# Patient Record
Sex: Male | Born: 1995
Health system: Southern US, Community
[De-identification: ages and names within clinical notes are randomized; demographics above are authoritative.]

## PROBLEM LIST (undated history)

## (undated) DIAGNOSIS — F17200 Nicotine dependence, unspecified, uncomplicated: Secondary | ICD-10-CM

## (undated) DIAGNOSIS — Z87898 Personal history of other specified conditions: Secondary | ICD-10-CM

## (undated) DIAGNOSIS — R569 Unspecified convulsions: Secondary | ICD-10-CM

---
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
## 2013-05-24 ENCOUNTER — Encounter (HOSPITAL_COMMUNITY): Payer: Self-pay | Admitting: Emergency Medicine

## 2013-05-24 ENCOUNTER — Emergency Department (HOSPITAL_COMMUNITY)
Admission: EM | Admit: 2013-05-24 | Discharge: 2013-05-24 | Disposition: A | Discharge status: Home / Self Care | Payer: Self-pay | Attending: Emergency Medicine | Admitting: Emergency Medicine

## 2013-05-24 ENCOUNTER — Encounter (HOSPITAL_COMMUNITY): Payer: Self-pay | Admitting: Cardiology

## 2013-05-24 DIAGNOSIS — T50995A Adverse effect of other drugs, medicaments and biological substances, initial encounter: Secondary | ICD-10-CM | POA: Insufficient documentation

## 2013-05-24 DIAGNOSIS — F17200 Nicotine dependence, unspecified, uncomplicated: Secondary | ICD-10-CM | POA: Insufficient documentation

## 2013-05-24 DIAGNOSIS — L255 Unspecified contact dermatitis due to plants, except food: Secondary | ICD-10-CM | POA: Insufficient documentation

## 2013-05-24 DIAGNOSIS — Y9389 Activity, other specified: Secondary | ICD-10-CM | POA: Insufficient documentation

## 2013-05-24 DIAGNOSIS — Y9289 Other specified places as the place of occurrence of the external cause: Secondary | ICD-10-CM | POA: Insufficient documentation

## 2013-05-24 DIAGNOSIS — T50905A Adverse effect of unspecified drugs, medicaments and biological substances, initial encounter: Secondary | ICD-10-CM

## 2013-05-24 DIAGNOSIS — L237 Allergic contact dermatitis due to plants, except food: Secondary | ICD-10-CM

## 2013-05-24 MED ORDER — LORAZEPAM 1 MG PO TABS
1.0000 mg | ORAL_TABLET | Freq: Once | ORAL | Status: AC
  Administered 2013-05-24: 1 mg via ORAL
  Filled 2013-05-24: qty 1

## 2013-05-24 MED ORDER — DEXAMETHASONE SODIUM PHOSPHATE 10 MG/ML IJ SOLN
10.0000 mg | Freq: Once | INTRAMUSCULAR | Status: AC
  Administered 2013-05-24: 10 mg via INTRAMUSCULAR
  Filled 2013-05-24: qty 1

## 2013-05-24 MED ORDER — HYDROXYZINE HCL 25 MG PO TABS: 25.0000 mg | ORAL_TABLET | Freq: Four times a day (QID) | ORAL | Status: DC | PRN

## 2013-05-24 NOTE — ED Notes (Signed)
Pt reports he was seen here this am for poison ivy, received a dexamethasone injection and discharged from our facility at 0910. Pt reports approx 1000 he became very angry, SOB, elevated heartbeat, cold sweats, and his face and upper torso became red and warm to touch. Pt denies taking Dexamethasone in the past but has taken Methadose tapering pack and had similar reaction but not as extreme. Pt a/o x4, no respiratory distress.

## 2013-05-24 NOTE — ED Provider Notes (Signed)
  Medical screening examination/treatment/procedure(s) were performed by non-physician practitioner and as supervising physician I was immediately available for consultation/collaboration.    The patient's EKG had sinus tachycardia, with a rate of 106, otherwise unremarkable, but abnormal EKG.  Gerhard Munch, MD 05/24/13 1331

## 2013-05-24 NOTE — ED Provider Notes (Signed)
CSN: [id number]     Arrival date & time 05/24/13  1047 History     First MD Initiated Contact with Patient 05/24/13 1121     Chief Complaint  Patient presents with  . Allergic Reaction   (Consider location/radiation/quality/duration/timing/severity/associated sxs/prior Treatment) HPI Comments: 17 year old male presents back to the emergency department with his neighbor and girlfriend after being discharged from the emergency department about 2 hours ago complaining of irritability, racing heart and anxiety after receiving a shot of Decadron for poison ivy. Patient states as soon as he got home he felt the need to "flip furniture", was very anxious, irritable and hyperactive with a racing heart. Denies facial swelling, difficulty swallowing or breathing. This is his second time receiving a steroid, last being when he was 17 years old, had a similar reaction at that time.  Patient is a 17 y.o. male presenting with allergic reaction. The history is provided by the patient and a friend.  Allergic Reaction Presenting symptoms: rash   Presenting symptoms: no difficulty swallowing     History reviewed. No pertinent past medical history. History reviewed. No pertinent past surgical history. History reviewed. No pertinent family history. History  Substance Use Topics  . Smoking status: Current Every Day Smoker  . Smokeless tobacco: Not on file  . Alcohol Use: No    Review of Systems  HENT: Negative for facial swelling and trouble swallowing.   Respiratory: Negative for shortness of breath.   Skin: Positive for rash.  Psychiatric/Behavioral: Positive for behavioral problems. The patient is nervous/anxious.   All other systems reviewed and are negative.    Allergies  Review of patient's allergies indicates no known allergies.  Home Medications   Current Outpatient Rx  Name  Route  Sig  Dispense  Refill  . diphenhydrAMINE (BENADRYL) 25 MG tablet   Oral   Take 25 mg by mouth once.         . hydrOXYzine (ATARAX/VISTARIL) 25 MG tablet   Oral   Take 1 tablet (25 mg total) by mouth every 6 (six) hours as needed for itching.   15 tablet   0    BP 147/89  Pulse 107  Temp(Src) 98.2 F (36.8 C) (Oral)  Resp 18  SpO2 100% Physical Exam  Nursing note and vitals reviewed. Constitutional: He is oriented to person, place, and time. He appears well-developed and well-nourished. No distress.  HENT:  Head: Normocephalic and atraumatic.  Mouth/Throat: Oropharynx is clear and moist.  Eyes: Conjunctivae are normal.  Neck: Normal range of motion. Neck supple.  Cardiovascular: Regular rhythm and normal heart sounds.  Tachycardia present.   Pulmonary/Chest: Effort normal and breath sounds normal.  Musculoskeletal: Normal range of motion. He exhibits no edema.  Neurological: He is alert and oriented to person, place, and time.  Skin: Skin is warm and dry. He is not diaphoretic.  Scattered maculopapular rash in a linear fashion across bilateral upper extremities and around neck. No evidence of secondary infection.  Psychiatric: His speech is normal and behavior is normal. Thought content normal. His mood appears anxious.    ED Course   Procedures (including critical care time)  Labs Reviewed - No data to display No results found. 1. Medication reaction, initial encounter     MDM  Patient with anxiety and inability after receiving a shot of Decadron. He was discharge is any further prednisone dose. He was discharged with Vistaril. No respiratory or airway compromise. He began to calmed down prior to being seen, in  no apparent distress. 1 mg Ativan given. Discussed side effects of prednisone. Return precautions discussed. Patient states understanding of plan and is agreeable.  Trevor Mace, PA-C 05/24/13 1144

## 2013-05-24 NOTE — ED Provider Notes (Signed)
  CSN: [id number]     Arrival date & time 05/24/13  0801 History     First MD Initiated Contact with Patient 05/24/13 [phone]     Chief Complaint  Patient presents with  . Rash   (Consider location/radiation/quality/duration/timing/severity/associated sxs/prior Treatment) The history is provided by the patient and medical records.   Patient presents to the ED for poison ivy exposure. 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 Patient now has rash to face, and bilateral UE.  Patient states he had poison ivy several times with similar symptoms. Denies any difficulty breathing or swallowing. Has tried taking Benadryl and applying Benadryl cream without significant relief of symptoms.  History reviewed. No pertinent past medical history. History reviewed. No pertinent past surgical history. No family history on file. History  Substance Use Topics  . Smoking status: Current Every Day Smoker  . Smokeless tobacco: Not on file  . Alcohol Use: No    Review of Systems  Skin: Positive for rash.  All other systems reviewed and are negative.    Allergies  Review of patient's allergies indicates no known allergies.  Home Medications   Current Outpatient Rx  Name  Route  Sig  Dispense  Refill  . diphenhydrAMINE (BENADRYL) 25 MG tablet   Oral   Take 25 mg by mouth once.          BP 120/72  Pulse 67  Temp(Src) 98.5 F (36.9 C) (Oral)  Resp 18  SpO2 98%  Physical Exam  Nursing note and vitals reviewed. Constitutional: He is oriented to person, place, and time. He appears well-developed and well-nourished.  HENT:  Head: Normocephalic and atraumatic.  Mouth/Throat: Uvula is midline, oropharynx is clear and moist and mucous membranes are normal. No oral lesions. No oropharyngeal exudate, posterior oropharyngeal edema, posterior oropharyngeal erythema or tonsillar abscesses.  No oral lesions identified, no oropharyngeal edema, airway  patent, handling secretions appropriately, speaking in full complete sentences without difficulty  Eyes: Conjunctivae and EOM are normal. Pupils are equal, round, and reactive to light.  Neck: Normal range of motion. Neck supple.  Cardiovascular: Normal rate, regular rhythm and normal heart sounds.   Pulmonary/Chest: Effort normal and breath sounds normal.  Musculoskeletal: Normal range of motion.  Neurological: He is alert and oriented to person, place, and time.  Skin: Skin is warm and dry. Rash noted.  Several areas of pruritic, red lesions in linear fashion across BUE and face, some lesions with excoriation, no signs of infection  Psychiatric: He has a normal mood and affect.    ED Course   Procedures (including critical care time)  Labs Reviewed - No data to display No results found.  1. Poison ivy     MDM   Rash consistent with recent poison ivy exposure. Decadron given in the ED. Rx Atarax. Instructed to use calamine lotion at home.  FU with [facility] if problems occur.  Discussed plan with pt, he agreed.  Return precautions advised.  Garlon Hatchet, PA-C 05/24/13 1132

## 2013-05-24 NOTE — ED Notes (Signed)
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 Now has redness and swelling to face, eyes, rash to extremities. Pt states "it's even in my throat". Airway intact.

## 2013-05-24 NOTE — ED Notes (Signed)
Pt reports that he was seen here earlier for poison ivy and given a shot of decadron. States he then began to felt like he was having a reaction. Reports feeling angry and that his heart was racing. Pt is tearful at triage and unable to sit still. States his body feels numb.

## 2013-05-24 NOTE — ED Provider Notes (Signed)
Medical screening examination/treatment/procedure(s) were performed by non-physician practitioner and as supervising physician I was immediately available for consultation/collaboration.    Gwyneth Sprout, MD 05/24/13 [phone]

## 2014-01-28 ENCOUNTER — Encounter (HOSPITAL_COMMUNITY): Payer: Self-pay | Admitting: Emergency Medicine

## 2014-01-28 ENCOUNTER — Emergency Department (HOSPITAL_COMMUNITY)
Admission: EM | Admit: 2014-01-28 | Discharge: 2014-01-29 | Disposition: A | Discharge status: Home / Self Care | Payer: Self-pay | Attending: Emergency Medicine | Admitting: Emergency Medicine

## 2014-01-28 DIAGNOSIS — F17200 Nicotine dependence, unspecified, uncomplicated: Secondary | ICD-10-CM | POA: Insufficient documentation

## 2014-01-28 DIAGNOSIS — Z8619 Personal history of other infectious and parasitic diseases: Secondary | ICD-10-CM | POA: Insufficient documentation

## 2014-01-28 DIAGNOSIS — Z202 Contact with and (suspected) exposure to infections with a predominantly sexual mode of transmission: Secondary | ICD-10-CM | POA: Insufficient documentation

## 2014-01-28 NOTE — ED Notes (Signed)
Pt states that his girlfriend had and STD and he wants to check and make sure he did not catch on as well.

## 2014-01-29 LAB — URINALYSIS, ROUTINE W REFLEX MICROSCOPIC
Bilirubin Urine: NEGATIVE
Glucose, UA: NEGATIVE mg/dL
HGB URINE DIPSTICK: NEGATIVE
Ketones, ur: NEGATIVE mg/dL
Leukocytes, UA: NEGATIVE
NITRITE: NEGATIVE
PH: 6 (ref 5.0–8.0)
Protein, ur: NEGATIVE mg/dL
SPECIFIC GRAVITY, URINE: 1.03 (ref 1.005–1.030)
UROBILINOGEN UA: 0.2 mg/dL (ref 0.0–1.0)

## 2014-01-29 LAB — RPR: RPR: NONREACTIVE

## 2014-01-29 LAB — HIV ANTIBODY (ROUTINE TESTING W REFLEX): HIV: NONREACTIVE

## 2014-01-29 MED ORDER — AZITHROMYCIN 250 MG PO TABS
1000.0000 mg | ORAL_TABLET | Freq: Once | ORAL | Status: AC
  Administered 2014-01-29: 1000 mg via ORAL
  Filled 2014-01-29: qty 4

## 2014-01-29 NOTE — ED Provider Notes (Signed)
Medical screening examination/treatment/procedure(s) were performed by non-physician practitioner and as supervising physician I was immediately available for consultation/collaboration.   EKG Interpretation None        Darlys Galesavid Masneri, MD 01/29/14 1009

## 2014-01-29 NOTE — ED Provider Notes (Signed)
CSN: [id number]     Arrival date & time 01/28/14  2348 History   First MD Initiated Contact with Patient 01/29/14 0017     Chief Complaint  Patient presents with  . Exposure to STD   HPI  History provided by patient and significant other. Patient is 18 year old male presenting with concerns for possible exposure to STD. Patient's partner states she was diagnosed with chlamydia infection. Patient denies any symptoms. He denies any dysuria, hematuria urinary frequency, penile discharge, testicle pain or swelling. He would like to be screened for all STDs. No other complaints.   History reviewed. No pertinent past medical history. History reviewed. No pertinent past surgical history. History reviewed. No pertinent family history. History  Substance Use Topics  . Smoking status: Current Every Day Smoker    Types: Cigarettes  . Smokeless tobacco: Not on file  . Alcohol Use: No    Review of Systems  Constitutional: Negative for fever.  Genitourinary: Negative for dysuria, frequency, hematuria, flank pain, discharge, scrotal swelling, penile pain and testicular pain.  All other systems reviewed and are negative.      Allergies  Dexamethasone and Prednisone  Home Medications   Current Outpatient Rx  Name  Route  Sig  Dispense  Refill  . hydrOXYzine (ATARAX/VISTARIL) 25 MG tablet   Oral   Take 1 tablet (25 mg total) by mouth every 6 (six) hours as needed for itching.   15 tablet   0    BP 115/83  Pulse 65  Temp(Src) 97.9 F (36.6 C) (Oral)  Resp 16  Ht 6' (1.829 m)  Wt 160 lb (72.576 kg)  BMI 21.70 kg/m2  SpO2 100% Physical Exam  Nursing note and vitals reviewed. Constitutional: He is oriented to person, place, and time. He appears well-developed and well-nourished.  HENT:  Head: Normocephalic.  Cardiovascular: Normal rate and regular rhythm.   Pulmonary/Chest: Effort normal and breath sounds normal. No respiratory distress.  Abdominal: Soft.  Genitourinary: Testes  normal and penis normal. Right testis shows no mass, no swelling and no tenderness. Left testis shows no mass, no swelling and no tenderness. Circumcised. No discharge found.  Lymphadenopathy:       Right: No inguinal adenopathy present.       Left: No inguinal adenopathy present.  Neurological: He is alert and oriented to person, place, and time.  Skin: Skin is warm. No rash noted.  Psychiatric: He has a normal mood and affect. His behavior is normal.    ED Course  Procedures COORDINATION OF CARE:  Nursing notes reviewed. Vital signs reviewed. Initial pt interview and examination performed.   Filed Vitals:   01/28/14 2356  BP: 115/83  Pulse: 65  Temp: 97.9 F (36.6 C)  TempSrc: Oral  Resp: 16  Height: 6' (1.829 m)  Weight: 160 lb (72.576 kg)  SpO2: 100%    12:47 AM-patient seen and evaluated. Patient well-appearing no acute distress. No signs or symptoms of STD at this time. We'll give treatment for chlamydia given partner's recent diagnosis. Other tests pending.   MDM   Final diagnoses:  Possible exposure to STD       Angus SellerPeter S Kyandra Mcclaine, PA-C 01/29/14 [phone]

## 2014-01-29 NOTE — ED Notes (Signed)
Pt states that he has no complaints at this time, just wants to be checked since his girlfriend recently found out she had an STD and did not know she had anything either.

## 2014-01-29 NOTE — Discharge Instructions (Signed)
You were given treatment for possible exposure to chlamydia.  You have other tests pending for other sexually transmitted disease. It is recommended you do not engage in intercourse until you have all of your results next week. Followup with a primary care provider or go for [facility] STD clinic for further evaluation and treatment.   Sexually Transmitted Disease A sexually transmitted disease (STD) is a disease or infection that may be passed (transmitted) from person to person, usually during sexual activity. This may happen by way of saliva, semen, blood, vaginal mucus, or urine. Common STDs include:   Gonorrhea.   Chlamydia.   Syphilis.   HIV and AIDS.   Genital herpes.   Hepatitis B and C.   Trichomonas.   Human papillomavirus (HPV).   Pubic lice.   Scabies.  Mites.  Bacterial vaginosis. WHAT ARE CAUSES OF STDs? An STD may be caused by bacteria, a virus, or parasites. STDs are often transmitted during sexual activity if one person is infected. However, they may also be transmitted through nonsexual means. STDs may be transmitted after:   Sexual intercourse with an infected person.   Sharing sex toys with an infected person.   Sharing needles with an infected person or using unclean piercing or tattoo needles.  Having intimate contact with the genitals, mouth, or rectal areas of an infected person.   Exposure to infected fluids during birth. WHAT ARE THE SIGNS AND SYMPTOMS OF STDs? Different STDs have different symptoms. Some people may not have any symptoms. If symptoms are present, they may include:   Painful or bloody urination.   Pain in the pelvis, abdomen, vagina, anus, throat, or eyes.   Skin rash, itching, irritation, growths, sores (lesions), ulcerations, or warts in the genital or anal area.  Abnormal vaginal discharge with or without bad odor.   Penile discharge in men.   Fever.   Pain or bleeding during sexual  intercourse.   Swollen glands in the groin area.   Yellow skin and eyes (jaundice). This is seen with hepatitis.   Swollen testicles.  Infertility.  Sores and blisters in the mouth. HOW ARE STDs DIAGNOSED? To make a diagnosis, your health care provider may:   Take a medical history.   Perform a physical exam.   Take a sample of any discharge for examination.  Swab the throat, cervix, opening to the penis, rectum, or vagina for testing.  Test a sample of your first morning urine.   Perform blood tests.   Perform a Pap smear, if this applies.   Perform a colposcopy.   Perform a laparoscopy.  HOW ARE STDs TREATED? Treatment depends on the STD. Some STDs may be treated but not cured.   Chlamydia, gonorrhea, trichomonas, and syphilis can be cured with antibiotics.   Genital herpes, hepatitis, and HIV can be treated, but not cured, with prescribed medicines. The medicines lessen symptoms.   Genital warts from HPV can be treated with medicine or by freezing, burning (electrocautery), or surgery. Warts may come back.   HPV cannot be cured with medicine or surgery. However, abnormal areas may be removed from the cervix, vagina, or vulva.   If your diagnosis is confirmed, your recent sexual partners need treatment. This is true even if they are symptom-free or have a negative culture or evaluation. They should not have sex until their health care providers say it is OK. HOW CAN I REDUCE MY RISK OF GETTING AN STD?  Use latex condoms, dental dams, and  water-soluble lubricants during sexual activity. Do not use petroleum jelly or oils.  Get vaccinated for HPV and hepatitis. If you have not received these vaccines in the past, talk to your health care provider about whether one or both might be right for you.   Avoid risky sex practices that can break the skin.  WHAT SHOULD I DO IF I THINK I HAVE AN STD?  See your health care provider.   Inform all sexual  partners. They should be tested and treated for any STDs.  Do not have sex until your health care provider says it is OK. WHEN SHOULD I GET HELP? Seek immediate medical care if:  You develop severe abdominal pain.  You are a man and notice swelling or pain in the testicles.  You are a woman and notice swelling or pain in your vagina. Document Released: 01/04/2003 Document Revised: 08/04/2013 Document Reviewed: 05/04/2013 [facility] Patient Information 2014 [place], [place].

## 2014-01-31 LAB — GC/CHLAMYDIA PROBE AMP
CT PROBE, AMP APTIMA: POSITIVE — AB
GC Probe RNA: NEGATIVE

## 2014-02-02 ENCOUNTER — Telehealth (HOSPITAL_BASED_OUTPATIENT_CLINIC_OR_DEPARTMENT_OTHER): Payer: Self-pay | Admitting: Emergency Medicine

## 2014-02-02 NOTE — Telephone Encounter (Signed)
+  Chlamydia. Patient treated with Rocephin and Zithromax. [employer] faxed. 

## 2014-05-22 ENCOUNTER — Emergency Department (HOSPITAL_COMMUNITY)
Admission: EM | Admit: 2014-05-22 | Discharge: 2014-05-22 | Discharge status: Against Medical Advice | Payer: Self-pay | Attending: Emergency Medicine | Admitting: Emergency Medicine

## 2014-05-22 ENCOUNTER — Encounter (HOSPITAL_COMMUNITY): Payer: Self-pay | Admitting: Emergency Medicine

## 2014-05-22 DIAGNOSIS — H9209 Otalgia, unspecified ear: Secondary | ICD-10-CM | POA: Insufficient documentation

## 2014-05-22 NOTE — ED Notes (Signed)
Pt states he is unable to wait to be seen because he has to work tomorrow.  States he will come back another day.

## 2014-05-22 NOTE — ED Notes (Signed)
Presents with left earlobe swelling and drainage began 4 days ago.

## 2014-12-05 ENCOUNTER — Emergency Department (HOSPITAL_BASED_OUTPATIENT_CLINIC_OR_DEPARTMENT_OTHER)
Admission: EM | Admit: 2014-12-05 | Discharge: 2014-12-05 | Disposition: A | Discharge status: Home / Self Care | Payer: Self-pay | Attending: Emergency Medicine | Admitting: Emergency Medicine

## 2014-12-05 ENCOUNTER — Emergency Department (HOSPITAL_BASED_OUTPATIENT_CLINIC_OR_DEPARTMENT_OTHER): Payer: Self-pay

## 2014-12-05 ENCOUNTER — Encounter (HOSPITAL_BASED_OUTPATIENT_CLINIC_OR_DEPARTMENT_OTHER): Payer: Self-pay

## 2014-12-05 DIAGNOSIS — K2970 Gastritis, unspecified, without bleeding: Secondary | ICD-10-CM

## 2014-12-05 DIAGNOSIS — Z720 Tobacco use: Secondary | ICD-10-CM | POA: Insufficient documentation

## 2014-12-05 DIAGNOSIS — K2900 Acute gastritis without bleeding: Secondary | ICD-10-CM | POA: Insufficient documentation

## 2014-12-05 DIAGNOSIS — Z79899 Other long term (current) drug therapy: Secondary | ICD-10-CM | POA: Insufficient documentation

## 2014-12-05 DIAGNOSIS — R109 Unspecified abdominal pain: Secondary | ICD-10-CM

## 2014-12-05 LAB — CBC WITH DIFFERENTIAL/PLATELET
BASOS PCT: 0 % (ref 0–1)
Basophils Absolute: 0 10*3/uL (ref 0.0–0.1)
Eosinophils Absolute: 0 10*3/uL (ref 0.0–0.7)
Eosinophils Relative: 0 % (ref 0–5)
HEMATOCRIT: 45.4 % (ref 39.0–52.0)
Hemoglobin: 16.2 g/dL (ref 13.0–17.0)
Lymphocytes Relative: 6 % — ABNORMAL LOW (ref 12–46)
Lymphs Abs: 1.5 10*3/uL (ref 0.7–4.0)
MCH: 30.7 pg (ref 26.0–34.0)
MCHC: 35.7 g/dL (ref 30.0–36.0)
MCV: 86.1 fL (ref 78.0–100.0)
MONO ABS: 1.2 10*3/uL — AB (ref 0.1–1.0)
MONOS PCT: 5 % (ref 3–12)
NEUTROS PCT: 89 % — AB (ref 43–77)
Neutro Abs: 20.9 10*3/uL — ABNORMAL HIGH (ref 1.7–7.7)
PLATELETS: 247 10*3/uL (ref 150–400)
RBC: 5.27 MIL/uL (ref 4.22–5.81)
RDW: 12.8 % (ref 11.5–15.5)
WBC: 23.6 10*3/uL — ABNORMAL HIGH (ref 4.0–10.5)

## 2014-12-05 LAB — COMPREHENSIVE METABOLIC PANEL
ALBUMIN: 5.1 g/dL (ref 3.5–5.2)
ALT: 9 U/L (ref 0–53)
AST: 28 U/L (ref 0–37)
Alkaline Phosphatase: 71 U/L (ref 39–117)
Anion gap: 9 (ref 5–15)
BILIRUBIN TOTAL: 0.9 mg/dL (ref 0.3–1.2)
BUN: 16 mg/dL (ref 6–23)
CALCIUM: 9.6 mg/dL (ref 8.4–10.5)
CHLORIDE: 106 mmol/L (ref 96–112)
CO2: 22 mmol/L (ref 19–32)
CREATININE: 0.89 mg/dL (ref 0.50–1.35)
GFR calc Af Amer: >90 mL/min (ref >90)
GFR calc non Af Amer: >90 mL/min (ref >90)
GLUCOSE: 142 mg/dL — AB (ref 70–99)
Potassium: 3.2 mmol/L — ABNORMAL LOW (ref 3.5–5.1)
SODIUM: 137 mmol/L (ref 135–145)
Total Protein: 7.9 g/dL (ref 6.0–8.3)

## 2014-12-05 LAB — LIPASE, BLOOD: LIPASE: 19 U/L (ref 11–59)

## 2014-12-05 MED ORDER — ONDANSETRON 4 MG PO TBDP: 4.0000 mg | ORAL_TABLET | Freq: Three times a day (TID) | ORAL | Status: DC | PRN

## 2014-12-05 MED ORDER — HYDROMORPHONE HCL 1 MG/ML IJ SOLN
0.5000 mg | Freq: Once | INTRAMUSCULAR | Status: AC
  Administered 2014-12-05: 0.5 mg via INTRAVENOUS
  Filled 2014-12-05: qty 1

## 2014-12-05 MED ORDER — KETOROLAC TROMETHAMINE 30 MG/ML IJ SOLN
30.0000 mg | Freq: Once | INTRAMUSCULAR | Status: AC
  Administered 2014-12-05: 30 mg via INTRAVENOUS
  Filled 2014-12-05: qty 1

## 2014-12-05 MED ORDER — SODIUM CHLORIDE 0.9 % IV BOLUS (SEPSIS)
1000.0000 mL | Freq: Once | INTRAVENOUS | Status: AC
  Administered 2014-12-05: 1000 mL via INTRAVENOUS

## 2014-12-05 MED ORDER — PROMETHAZINE HCL 25 MG PO TABS: 25.0000 mg | ORAL_TABLET | Freq: Four times a day (QID) | ORAL | Status: DC | PRN

## 2014-12-05 MED ORDER — ONDANSETRON HCL 4 MG/2ML IJ SOLN
4.0000 mg | Freq: Once | INTRAMUSCULAR | Status: AC
  Administered 2014-12-05: 4 mg via INTRAVENOUS
  Filled 2014-12-05: qty 2
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
## 2014-12-05 NOTE — ED Notes (Signed)
MD at bedside, discussing results with patient and girlfriend.

## 2014-12-05 NOTE — Discharge Instructions (Signed)
RESOURCE GUIDE ° °Chronic Pain Problems: °Contact [facility]  [phone] °Patients need to be referred by their primary care doctor. ° °Insufficient Money for Medicine: °Contact [employer]:  call "211."  ° °No Primary Care Doctor: °- Call [facility]  [phone] - can help you locate a primary care doctor that  accepts your insurance, provides certain services, etc. °- Physician Referral Service- 1-800-533-3463 ° °Agencies that provide inexpensive medical care: °- [facility]  [phone] °- [facility]  [phone] °- [facility]  [phone] °- [facility]  [phone] °- [facility]  [phone] °- [facility]  [phone] ° °Medicaid-accepting [place] Providers: °- [facility]- [address] ° [phone], Mon-Fri 9am-7pm, Sat 9am-1pm °- [facility]- [address], [address] ° [phone] °- [facility]- [address], [address] ° [phone] °- [facility]- [address] [place] [address] ° [phone] °- [facility]- [address], [address], [phone] ° Only accepts [place] Access Medicaid patients after they have their name  applied to their card ° °Self Pay (no insurance) in [place]: °- Sickle Cell Patients: Dr Eric Dean, [facility] ° [address], [phone] °- [facility]- [address] ° [phone] °      -      [facility]- [address] °      -     [facility]- see information above (Speak to Pam H if you do not have insurance) °      -  [facility] [place]- [address],  [phone] °      -  [facility]- [address] [place] [address], [phone] °      -  Dr Osei-Bonsu-  [address], [place], [phone] °      -  [facility] - [address], [phone] °      -  [facility]- [address] [place] [address], [phone],  also [address] °  [address], [phone] °      -    [facility]- [address], [phone], 1st & 3rd Saturday °       every month, 10am-1pm ° °[facility] °[address] °[place], [place] [address] °([phone] ° °[facility] °[address] °[place], [place] [address] °([phone]) [phone] ° °1) Find a Doctor and Pay Out of Pocket °Although you won't have to find out who is covered by your insurance plan, it is a good idea to ask around and get recommendations. You will then need to call the office and see if the doctor you have chosen will accept you as a new patient and what types of options they offer for patients who are self-pay. Some doctors offer discounts or will set up payment plans for their patients who do not have insurance, but you will need to ask so you aren't surprised when you get to your appointment. ° °2) Contact Your Local Health Department °Not all health departments have doctors that can see patients for sick visits, but many do, so it is worth a call to see if yours does. If you don't know where your local health department is, you can check in your phone book. The CDC also has a tool   to help you locate your state's health department, and many state websites also have listings of all of their local health departments. ° °3) Find a Walk-in Clinic °If your illness is not likely to be very severe or complicated, you may want to try a walk in clinic. These are popping up all over the country in pharmacies, drugstores, and shopping centers. They're usually staffed by nurse practitioners or physician assistants that have been trained to treat common illnesses and complaints. They're usually fairly quick and inexpensive. However, if you have serious medical issues or chronic medical problems, these are probably not your best option ° °STD Testing °- [place] [facility] [place], [facility], [address], [place],  phone [phone] or 1-877-539-9860.  Monday - Friday, call for an appointment. °- [place] [facility] [place], [facility], [address], [place], phone [phone] or 1-877-539-9860.  Monday - Friday, call for an appointment. ° °Abuse/Neglect: °- [place] Child Abuse Hotline (336) 641-3795 °- [place] Child Abuse Hotline 800-378-5315 (After Hours) ° °Emergency Shelter:  [facility] (336) 271-5985 ° °Maternity Homes: °- [facility] (336) 275-9566 °- [facility] (704) 372-4663 ° °MRSA Hotline #:   832-7006 ° °Dental Assistance °If unable to pay or uninsured, contact:  [place] [facility] to become qualified for the adult dental clinic. ° °Patients with Medicaid: [facility] °[address], [phone] °[address], [phone] ° °If unable to pay, or uninsured, contact [place] [facility] ([phone] in [place], [phone] in [place]) to become qualified for the adult dental clinic ° °[facility] °[address] °[place], [place] [address] °(336) 272-4177 °[email or web address] ° °Other Low-Cost Community Dental Services: °- [facility]- [address], [place], [place], [address], [phone], Ext. [address], 2nd and 4th Thursday of the month at 6:30am.  10 clients each day by appointment, can sometimes see walk-in patients if someone does not show for an appointment. °- [facility]- [address], [place], [place], [address], [phone] °- [facility]- [address], [place], [place], [address], [phone] °- [facility]- [phone] °- [facility]- [phone] °- [facility]- [phone] °-  °

## 2014-12-05 NOTE — ED Notes (Signed)
Pt brought in by [facility]. Pt sts drinking last night. Reports nausea and vomiting this morning.

## 2014-12-05 NOTE — ED Notes (Signed)
Patient transported to Ultrasound 
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
## 2015-05-29 ENCOUNTER — Encounter (HOSPITAL_COMMUNITY): Payer: Self-pay | Admitting: *Deleted

## 2015-05-29 ENCOUNTER — Emergency Department (HOSPITAL_COMMUNITY)
Admission: EM | Admit: 2015-05-29 | Discharge: 2015-05-29 | Disposition: A | Discharge status: Home / Self Care | Payer: Self-pay | Attending: Emergency Medicine | Admitting: Emergency Medicine

## 2015-05-29 DIAGNOSIS — L041 Acute lymphadenitis of trunk: Secondary | ICD-10-CM | POA: Insufficient documentation

## 2015-05-29 DIAGNOSIS — Z720 Tobacco use: Secondary | ICD-10-CM | POA: Insufficient documentation

## 2015-05-29 DIAGNOSIS — I889 Nonspecific lymphadenitis, unspecified: Secondary | ICD-10-CM

## 2015-05-29 MED ORDER — DOXYCYCLINE HYCLATE 100 MG PO CAPS: 100.0000 mg | ORAL_CAPSULE | Freq: Two times a day (BID) | ORAL | Status: DC

## 2015-05-29 NOTE — Discharge Instructions (Signed)

## 2015-05-29 NOTE — ED Provider Notes (Signed)
History   Chief Complaint  Patient presents with  . Groin Pain    HPI Patient is a previously healthy 19 year old male who presents to ED for evaluation of left groin pain. Patient reports this began yesterday. He says his pain is rated moderate. He also reports having low-grade fevers to 100.5. Denies history of similar symptoms in the past or history of hernias. States he is having normal bowel movements. Denies any nausea or vomiting. Also denies any rashes. Patient says he has a history of prior Chlamydia this past year which he was treated for and denies any penile discharge or penile/scrotal pain. Patient also notes having a small lesion in his groin which is been there for several months. He says this began after having a tick in this region that he pulled off many months ago; does not having small rash after this; denies arthralgias/myalgias. No recent rashes. Denies skin lesions to LLE. He says a couple weeks ago he was trimming his pubic hair when he nicked this bump and caused it to be painful and now swollen. He denies having any other genital lesions. Denies any new sexual partners. Patient reports palpation of his groin makes his pain worse. He has tried ibuprofen without significant relief. Patient states he does not have a PCP. No other complaints.  Past medical/surgical history, social history, medications, allergies and FH have been reviewed with patient and/or in documentation. Furthermore, if pt family or friend(s) present, additional historical information was obtained from them.  History reviewed. No pertinent past medical history. History reviewed. No pertinent past surgical history. No family history on file. History  Substance Use Topics  . Smoking status: Current Every Day Smoker -- 1.00 packs/day    Types: Cigarettes  . Smokeless tobacco: Not on file  . Alcohol Use: Yes     Review of Systems Constitutional: - F/C, -fatigue.  HENT: - congestion, -rhinorrhea,  -sore throat.   Eyes: - eye pain, -visual disturbance.  Respiratory: - cough, -SOB, -hemoptysis.   Cardiovascular: - CP, -palps.  Gastrointestinal: - N/V/D, -abd pain  Genitourinary: - flank pain, -dysuria, -frequency. + groin pain. + genital lesion. Musculoskeletal: - myalgia/arthritis, -joint swelling, -gait abnormality, -back pain, -neck pain/stiffness, -leg pain/swelling.  Skin: - rash/lesion.  Neurological: - focal weakness, -lightheadedness, -dizziness, -numbness, -HA.  All other systems reviewed and are negative.   Physical Exam  Physical Exam  ED Triage Vitals  Enc Vitals Group     BP 05/29/15 2110 100/83 mmHg     Pulse Rate 05/29/15 2110 106     Resp 05/29/15 2110 18     Temp 05/29/15 2110 97.8 F (36.6 C)     Temp Source 05/29/15 2110 Oral     SpO2 05/29/15 2110 97 %     Weight --      Height --      Head Cir --      Peak Flow --      Pain Score 05/29/15 2109 4     Pain Loc --      Pain Edu? --      Excl. in GC? --    Constitutional: Patient is well appearing and in no acute distress Head: Normocephalic and atraumatic.  Eyes: Extraocular motion intact, no scleral icterus Mouth: MMM, OP clear Neck: Supple without meningismus, mass, or overt JVD Respiratory: No respiratory distress. Normal WOB. No w/r/g. CV: RRR, no obvious murmurs.  Pulses +2 and symmetric. Euvolemic Abdomen: Soft, NT, ND, no r/g. No mass.  GU: small swollen hair follicle that is mildly TTP adjacent to base of penis on L. Tender/swollen LNs in L groin. No inguinal hernia appreciated BL. No ulcers or other skin lesions noted. MSK: Extremities are atraumatic without deformity, ROM intact Skin: Warm, dry, intact without rash. No lesions noted o/w. Neuro: AAOx4, MAE 5/5 sym, no focal deficit noted   ED Course  Procedures   MDM: Jaime Nixon is a 19 y.o. male with H&P as above who p/w CC: L groin pain Pt has L inguinal lymphadenopathy and reports temp at home of 100.4. AF in ED. HDS, NAD.  Benign exam o/w. Smal lesion to groin c/w likely folliculitis. Given pt report of prior tick exposure in region of folliculitis and now with ipsi lymphadenopathy and LGF, will cover with Doxy. However, STI could cause same sxs and is more likely. No findings to warrant tx of GC/Chlamydia at this time. Urine probe sent. Pt advised to f/u closely with a PCP for further STD testing. Pt stable for d/c.  Old records reviewed (if available). Labs and imaging reviewed personally by myself and considered in medical decision making if ordered. Clinical Impression: 1. Inguinal lymphadenitis     Disposition: Discharge  Condition: Good  I have discussed the results, Dx and Tx plan with the pt(& family if present). He/she/they expressed understanding and agree(s) with the plan. Discharge instructions discussed at great length. Strict return precautions discussed and pt &/or family have verbalized understanding of the instructions. No further questions at time of discharge.    Discharge Medication List as of 05/29/2015 11:02 PM    START taking these medications   Details  doxycycline (VIBRAMYCIN) 100 MG capsule Take 1 capsule (100 mg total) by mouth 2 (two) times daily., Starting 05/29/2015, Until Discontinued, Print        Follow Up: [facility]     [address] [place] [address] [phone] Schedule an appointment as soon as possible for a visit in 1 week For STD evaluation and to establish primary care  [facility] EMERGENCY DEPARTMENT [address] [id number] mc [place] [address] [phone]  If symptoms worsen   Pt seen in conjunction with Dr. Graylon Good, DO [facility] Emergency Medicine Resident - PGY-3      Ames Dura, MD 05/29/15 4401  Raeford Razor, MD 05/31/15 [phone]

## 2015-05-29 NOTE — ED Notes (Addendum)
Pt c/o left lower groin pain since yesterday. States that he is concerned that he has a hernia. Denies N/V/D.

## 2015-05-30 LAB — GC/CHLAMYDIA PROBE AMP (~~LOC~~) NOT AT ARMC
Chlamydia: NEGATIVE
NEISSERIA GONORRHEA: NEGATIVE

## 2015-05-31 ENCOUNTER — Emergency Department (HOSPITAL_COMMUNITY)
Admission: EM | Admit: 2015-05-31 | Discharge: 2015-05-31 | Disposition: A | Discharge status: Home / Self Care | Payer: Self-pay | Attending: Emergency Medicine | Admitting: Emergency Medicine

## 2015-05-31 ENCOUNTER — Encounter (HOSPITAL_COMMUNITY): Payer: Self-pay

## 2015-05-31 DIAGNOSIS — Y92009 Unspecified place in unspecified non-institutional (private) residence as the place of occurrence of the external cause: Secondary | ICD-10-CM | POA: Insufficient documentation

## 2015-05-31 DIAGNOSIS — T2010XA Burn of first degree of head, face, and neck, unspecified site, initial encounter: Secondary | ICD-10-CM

## 2015-05-31 DIAGNOSIS — Y999 Unspecified external cause status: Secondary | ICD-10-CM | POA: Insufficient documentation

## 2015-05-31 DIAGNOSIS — R000 Tachycardia, unspecified: Secondary | ICD-10-CM | POA: Insufficient documentation

## 2015-05-31 DIAGNOSIS — T2016XA Burn of first degree of forehead and cheek, initial encounter: Secondary | ICD-10-CM | POA: Insufficient documentation

## 2015-05-31 DIAGNOSIS — T2601XA Burn of right eyelid and periocular area, initial encounter: Secondary | ICD-10-CM | POA: Insufficient documentation

## 2015-05-31 DIAGNOSIS — T22131A Burn of first degree of right upper arm, initial encounter: Secondary | ICD-10-CM | POA: Insufficient documentation

## 2015-05-31 DIAGNOSIS — T23161A Burn of first degree of back of right hand, initial encounter: Secondary | ICD-10-CM | POA: Insufficient documentation

## 2015-05-31 DIAGNOSIS — W401XXA Explosion of explosive gases, initial encounter: Secondary | ICD-10-CM | POA: Insufficient documentation

## 2015-05-31 DIAGNOSIS — T2012XA Burn of first degree of lip(s), initial encounter: Secondary | ICD-10-CM | POA: Insufficient documentation

## 2015-05-31 DIAGNOSIS — Z792 Long term (current) use of antibiotics: Secondary | ICD-10-CM | POA: Insufficient documentation

## 2015-05-31 DIAGNOSIS — Z720 Tobacco use: Secondary | ICD-10-CM | POA: Insufficient documentation

## 2015-05-31 DIAGNOSIS — Z23 Encounter for immunization: Secondary | ICD-10-CM | POA: Insufficient documentation

## 2015-05-31 DIAGNOSIS — Y9389 Activity, other specified: Secondary | ICD-10-CM | POA: Insufficient documentation

## 2015-05-31 DIAGNOSIS — T23101A Burn of first degree of right hand, unspecified site, initial encounter: Secondary | ICD-10-CM

## 2015-05-31 DIAGNOSIS — T2602XA Burn of left eyelid and periocular area, initial encounter: Secondary | ICD-10-CM | POA: Insufficient documentation

## 2015-05-31 MED ORDER — BACITRACIN ZINC 500 UNIT/GM EX OINT: 1.0000 "application " | TOPICAL_OINTMENT | Freq: Two times a day (BID) | CUTANEOUS | Status: DC

## 2015-05-31 MED ORDER — TETANUS-DIPHTH-ACELL PERTUSSIS 5-2.5-18.5 LF-MCG/0.5 IM SUSP
0.5000 mL | Freq: Once | INTRAMUSCULAR | Status: AC
  Administered 2015-05-31: 0.5 mL via INTRAMUSCULAR
  Filled 2015-05-31: qty 0.5

## 2015-05-31 MED ORDER — TETRACAINE HCL 0.5 % OP SOLN
1.0000 [drp] | Freq: Once | OPHTHALMIC | Status: AC
  Administered 2015-05-31: 1 [drp] via OPHTHALMIC
  Filled 2015-05-31: qty 2

## 2015-05-31 MED ORDER — BACITRACIN ZINC 500 UNIT/GM EX OINT
1.0000 "application " | TOPICAL_OINTMENT | Freq: Two times a day (BID) | CUTANEOUS | Status: DC
  Administered 2015-05-31: 1 via TOPICAL
  Filled 2015-05-31: qty 1.8

## 2015-05-31 MED ORDER — FLUORESCEIN SODIUM 1 MG OP STRP
1.0000 | ORAL_STRIP | Freq: Once | OPHTHALMIC | Status: AC
  Administered 2015-05-31: 1 via OPHTHALMIC
  Filled 2015-05-31: qty 1
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
## 2015-05-31 NOTE — Discharge Instructions (Signed)
Burn Care Your skin is a natural barrier to infection. It is the largest organ of your body. Burns damage this natural protection. To help prevent infection, it is very important to follow your caregiver's instructions in the care of your burn. Burns are classified as:  First degree. There is only redness of the skin (erythema). No scarring is expected.  Second degree. There is blistering of the skin. Scarring may occur with deeper burns.  Third degree. All layers of the skin are injured, and scarring is expected. HOME CARE INSTRUCTIONS   Wash your hands well before changing your bandage.  Change your bandage as often as directed by your caregiver.  Remove the old bandage. If the bandage sticks, you may soak it off with cool, clean water.  Cleanse the burn thoroughly but gently with mild soap and water.  Pat the area dry with a clean, dry cloth.  Apply a thin layer of antibacterial cream to the burn.  Apply a clean bandage as instructed by your caregiver.  Keep the bandage as clean and dry as possible.  Elevate the affected area for the first 24 hours, then as instructed by your caregiver.  Only take over-the-counter or prescription medicines for pain, discomfort, or fever as directed by your caregiver. SEEK IMMEDIATE MEDICAL CARE IF:   You develop excessive pain.  You develop redness, tenderness, swelling, or red streaks near the burn.  The burned area develops yellowish-white fluid (pus) or a bad smell.  You have a fever. MAKE SURE YOU:   Understand these instructions.  Will watch your condition.  Will get help right away if you are not doing well or get worse. Document Released: 10/14/2005 Document Revised: 01/06/2012 Document Reviewed: 03/06/2011 Exit[employer] Patient Information 2015 [employer]. This information is not intended to replace advice given to you by your health care provider. Make sure you discuss any questions you have with your health care  provider.  
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
## 2015-06-01 ENCOUNTER — Encounter (HOSPITAL_COMMUNITY): Payer: Self-pay | Admitting: *Deleted

## 2015-06-01 ENCOUNTER — Emergency Department (HOSPITAL_COMMUNITY): Payer: Self-pay

## 2015-06-01 ENCOUNTER — Emergency Department (HOSPITAL_COMMUNITY)
Admission: EM | Admit: 2015-06-01 | Discharge: 2015-06-01 | Disposition: A | Discharge status: Home / Self Care | Payer: Self-pay | Attending: Emergency Medicine | Admitting: Emergency Medicine

## 2015-06-01 DIAGNOSIS — L043 Acute lymphadenitis of lower limb: Secondary | ICD-10-CM | POA: Insufficient documentation

## 2015-06-01 DIAGNOSIS — I889 Nonspecific lymphadenitis, unspecified: Secondary | ICD-10-CM

## 2015-06-01 DIAGNOSIS — Z720 Tobacco use: Secondary | ICD-10-CM | POA: Insufficient documentation

## 2015-06-01 LAB — URINALYSIS, ROUTINE W REFLEX MICROSCOPIC
Bilirubin Urine: NEGATIVE
Glucose, UA: NEGATIVE mg/dL
Hgb urine dipstick: NEGATIVE
KETONES UR: NEGATIVE mg/dL
LEUKOCYTES UA: NEGATIVE
NITRITE: NEGATIVE
PH: 6.5 (ref 5.0–8.0)
PROTEIN: NEGATIVE mg/dL
Specific Gravity, Urine: 1.028 (ref 1.005–1.030)
UROBILINOGEN UA: 0.2 mg/dL (ref 0.0–1.0)

## 2015-06-01 LAB — CBC WITH DIFFERENTIAL/PLATELET
BASOS PCT: 0 % (ref 0–1)
Basophils Absolute: 0 10*3/uL (ref 0.0–0.1)
Eosinophils Absolute: 0.1 10*3/uL (ref 0.0–0.7)
Eosinophils Relative: 1 % (ref 0–5)
HCT: 43.5 % (ref 39.0–52.0)
Hemoglobin: 15.2 g/dL (ref 13.0–17.0)
Lymphocytes Relative: 21 % (ref 12–46)
Lymphs Abs: 2 10*3/uL (ref 0.7–4.0)
MCH: 30.5 pg (ref 26.0–34.0)
MCHC: 34.9 g/dL (ref 30.0–36.0)
MCV: 87.2 fL (ref 78.0–100.0)
MONOS PCT: 9 % (ref 3–12)
Monocytes Absolute: 0.9 10*3/uL (ref 0.1–1.0)
Neutro Abs: 6.6 10*3/uL (ref 1.7–7.7)
Neutrophils Relative %: 69 % (ref 43–77)
Platelets: 193 10*3/uL (ref 150–400)
RBC: 4.99 MIL/uL (ref 4.22–5.81)
RDW: 13.3 % (ref 11.5–15.5)
WBC: 9.5 10*3/uL (ref 4.0–10.5)

## 2015-06-01 LAB — BASIC METABOLIC PANEL
ANION GAP: 9 (ref 5–15)
BUN: 10 mg/dL (ref 6–20)
CALCIUM: 9.7 mg/dL (ref 8.9–10.3)
CHLORIDE: 102 mmol/L (ref 101–111)
CO2: 27 mmol/L (ref 22–32)
CREATININE: 0.95 mg/dL (ref 0.61–1.24)
Glucose, Bld: 105 mg/dL — ABNORMAL HIGH (ref 65–99)
Potassium: 4.3 mmol/L (ref 3.5–5.1)
Sodium: 138 mmol/L (ref 135–145)

## 2015-06-01 MED ORDER — KETOROLAC TROMETHAMINE 30 MG/ML IJ SOLN
30.0000 mg | Freq: Once | INTRAMUSCULAR | Status: AC
  Administered 2015-06-01: 30 mg via INTRAVENOUS
  Filled 2015-06-01: qty 1

## 2015-06-01 MED ORDER — IOHEXOL 300 MG/ML  SOLN
100.0000 mL | Freq: Once | INTRAMUSCULAR | Status: AC | PRN
  Administered 2015-06-01: 100 mL via INTRAVENOUS

## 2015-06-01 MED ORDER — SODIUM CHLORIDE 0.9 % IV BOLUS (SEPSIS)
1000.0000 mL | Freq: Once | INTRAVENOUS | Status: AC
  Administered 2015-06-01: 1000 mL via INTRAVENOUS

## 2015-06-01 MED ORDER — NAPROXEN 500 MG PO TABS: 500.0000 mg | ORAL_TABLET | Freq: Two times a day (BID) | ORAL | Status: DC

## 2015-06-01 MED ORDER — SULFAMETHOXAZOLE-TRIMETHOPRIM 800-160 MG PO TABS: 1.0000 | ORAL_TABLET | Freq: Two times a day (BID) | ORAL | Status: AC

## 2015-06-01 NOTE — ED Notes (Signed)
Pt states that he does not have any pain in his groin unless it is touched. The pt states that "my legs have just been quitting on me when I walk, they just give out under me".

## 2015-06-01 NOTE — ED Notes (Signed)
Pt states that he started having leg pain on Monday and "had to crawl into the house my legs were hurting so bad" also, "on Wednesday I thought I had a hernia, I looked it up online and I had every symptom."

## 2015-06-01 NOTE — ED Notes (Signed)
The pt is c/o a swelling in his  Lt groin area since yesterday.  His pain has continued today whenever he walks. No pain at present

## 2015-06-01 NOTE — ED Notes (Signed)
Patient transported to CT 

## 2015-06-01 NOTE — ED Notes (Signed)
Yellow band and socks placed on pt.

## 2015-06-01 NOTE — ED Provider Notes (Signed)
CSN: [id number]     Arrival date & time 06/01/15  0227 History   First MD Initiated Contact with Patient 06/01/15 0242     Chief Complaint  Patient presents with  . Groin Swelling     (Consider location/radiation/quality/duration/timing/severity/associated sxs/prior Treatment) HPI Comments: Patient is a 19 year old male presents to ED for evaluation of left groin pain that has continued since being seen and discharged on 05/29/15. Patient states he did not fill his antibiotic prescription because he could not afford it. He states his pain is 2/10, no medications tried prior to arrival. Denies history of similar symptoms in the past or history of hernias. States he is having normal bowel movements. Denies any nausea or vomiting. Also denies any rashes. Patient was tested for STDs on 05/29/15, test results negative.    History reviewed. No pertinent past medical history. History reviewed. No pertinent past surgical history. No family history on file. History  Substance Use Topics  . Smoking status: Current Every Day Smoker -- 1.00 packs/day    Types: Cigarettes  . Smokeless tobacco: Not on file  . Alcohol Use: Yes    Review of Systems  Genitourinary:       + groin pain  All other systems reviewed and are negative.     Allergies  Dexamethasone and Prednisone  Home Medications   Prior to Admission medications   Medication Sig Start Date End Date Taking? Authorizing Provider  bacitracin ointment Apply 1 application topically 2 (two) times daily. 05/31/15   Roxy Horseman, PA-C  hydrOXYzine (ATARAX/VISTARIL) 25 MG tablet Take 1 tablet (25 mg total) by mouth every 6 (six) hours as needed for itching. Patient not taking: Reported on 05/29/2015 05/24/13   Garlon Hatchet, PA-C  naproxen (NAPROSYN) 500 MG tablet Take 1 tablet (500 mg total) by mouth 2 (two) times daily with a meal. 06/01/15   Francee Piccolo, PA-C  ondansetron (ZOFRAN ODT) 4 MG disintegrating tablet Take 1 tablet (4 mg  total) by mouth every 8 (eight) hours as needed for nausea. Patient not taking: Reported on 05/29/2015 12/05/14   Eber Hong, MD  promethazine (PHENERGAN) 25 MG tablet Take 1 tablet (25 mg total) by mouth every 6 (six) hours as needed for nausea or vomiting. Patient not taking: Reported on 05/29/2015 12/05/14   Eber Hong, MD  sulfamethoxazole-trimethoprim (BACTRIM DS,SEPTRA DS) 800-160 MG per tablet Take 1 tablet by mouth 2 (two) times daily. 06/01/15 06/08/15  Benino Korinek, PA-C   BP 123/72 mmHg  Pulse 58  Temp(Src) 97.5 F (36.4 C) (Oral)  Resp 16  Ht 6' (1.829 m)  Wt 167 lb 2 oz (75.807 kg)  BMI 22.66 kg/m2  SpO2 100% Physical Exam  Constitutional: He is oriented to person, place, and time. He appears well-developed and well-nourished. No distress.  HENT:  Head: Normocephalic and atraumatic.  Right Ear: External ear normal.  Left Ear: External ear normal.  Nose: Nose normal.  Mouth/Throat: Oropharynx is clear and moist.  Eyes: Conjunctivae are normal.  Neck: Normal range of motion. Neck supple.  No nuchal rigidity.   Cardiovascular: Normal rate, regular rhythm and normal heart sounds.   Pulmonary/Chest: Effort normal.  Abdominal: Soft.  Genitourinary: Penis normal. No penile tenderness.  Tender swollen lymph nodes in left groin. No inguinal hernia appreciated. No ulcers or skin lesions. No erythema or warmth noted. No drainage.   Musculoskeletal: Normal range of motion.  Neurological: He is alert and oriented to person, place, and time.  Skin: Skin is warm  and dry. He is not diaphoretic.  Psychiatric: He has a normal mood and affect.  Nursing note and vitals reviewed.   ED Course  Procedures (including critical care time) Medications  sodium chloride 0.9 % bolus 1,000 mL (1,000 mLs Intravenous New Bag/Given 06/01/15 0400)  ketorolac (TORADOL) 30 MG/ML injection 30 mg (30 mg Intravenous Given 06/01/15 0400)  iohexol (OMNIPAQUE) 300 MG/ML solution 100 mL (100 mLs Intravenous  Contrast Given 06/01/15 0420)    Labs Review Labs Reviewed  BASIC METABOLIC PANEL - Abnormal; Notable for the following:    Glucose, Bld 105 (*)    All other components within normal limits  CBC WITH DIFFERENTIAL/PLATELET  URINALYSIS, ROUTINE W REFLEX MICROSCOPIC (NOT AT [facility])  B. BURGDORFI ANTIBODIES  ROCKY MTN SPOTTED FVR ABS PNL(IGG+IGM)    Imaging Review Ct Pelvis W Contrast  06/01/2015   CLINICAL DATA:  LEFT groin swelling beginning yesterday, at pain with walking.  EXAM: CT PELVIS WITH CONTRAST  TECHNIQUE: Multidetector CT imaging of the pelvis was performed using the standard protocol following the bolus administration of intravenous contrast.  CONTRAST:  OMNIPAQUE IOHEXOL 300 MG/ML  SOLN  COMPARISON:  None.  FINDINGS: 15 mm short access LEFT external iliac chain homogeneously enhancing reniform lymph node, axial 30/55. Mild LEFT inguinal lymphadenopathy measuring up to 12 mm short access. Smaller RIGHT inguinal lymph nodes. No hernia, free fluid or focal fluid collection. Iliac and included femoral arteries are widely patent. Symmetric appearance of the pelvic musculature. No acute osseous process.  Normal appearance of the pelvic contents. Normal appendix. Prostate is not enlarged, urinary bladder is well distended and normal.  IMPRESSION: LEFT external iliac/ inguinal lymphadenopathy is likely reactive.   Electronically Signed   By: Awilda Metro M.D.   On: 06/01/2015 05:15     EKG Interpretation None      MDM   Final diagnoses:  Inguinal lymphadenitis    Filed Vitals:   06/01/15 0530  BP: 123/72  Pulse: 58  Temp:   Resp:     Afebrile, NAD, non-toxic appearing, AAOx4.   I have reviewed nursing notes, vital signs, and all lab and all imaging results as noted above.  Patient with left inguinal lymphadenopathy. No rash. STD tests negative from previous ED visit on 8/1. Labs reviewed. No leukocytosis. No UTI. Given cost of doxycyline and patient not wanting to  fill without rash will send tick borne illness titers. Will switch to Bactrim for folliculitis treatment as concern from previous ED visit. CT scan reviewed w/o acute findings, left lymphadenopathy noted. Return precautions discussed. Patient is agreeable to plan. Patient is stable at time of discharge   Francee Piccolo, PA-C 06/01/15 8119  Blake Divine, MD 06/01/15 [phone]

## 2015-06-01 NOTE — ED Notes (Signed)
Jen PA at bedside. 

## 2015-06-01 NOTE — Discharge Instructions (Signed)
Please follow up with your primary care physician in 1-2 days. If you do not have one please call the [facility] number listed above.Please take your antibiotic until completion. Please read all discharge instructions and return precautions.    Lymphadenopathy Lymphadenopathy means "disease of the lymph glands." But the term is usually used to describe swollen or enlarged lymph glands, also called lymph nodes. These are the bean-shaped organs found in many locations including the neck, underarm, and groin. Lymph glands are part of the immune system, which fights infections in your body. Lymphadenopathy can occur in just one area of the body, such as the neck, or it can be generalized, with lymph node enlargement in several areas. The nodes found in the neck are the most common sites of lymphadenopathy. CAUSES When your immune system responds to germs (such as viruses or bacteria ), infection-fighting cells and fluid build up. This causes the glands to grow in size. Usually, this is not something to worry about. Sometimes, the glands themselves can become infected and inflamed. This is called lymphadenitis. Enlarged lymph nodes can be caused by many diseases:  Bacterial disease, such as strep throat or a skin infection.  Viral disease, such as a common cold.  Other germs, such as Lyme disease, tuberculosis, or sexually transmitted diseases.  Cancers, such as lymphoma (cancer of the lymphatic system) or leukemia (cancer of the white blood cells).  Inflammatory diseases such as lupus or rheumatoid arthritis.  Reactions to medications. Many of the diseases above are rare, but important. This is why you should see your caregiver if you have lymphadenopathy. SYMPTOMS  Swollen, enlarged lumps in the neck, back of the head, or other locations.  Tenderness.  Warmth or redness of the skin over the lymph nodes.  Fever. DIAGNOSIS Enlarged lymph nodes are often near the source  of infection. They can help health care providers diagnose your illness. For instance:  Swollen lymph nodes around the jaw might be caused by an infection in the mouth.  Enlarged glands in the neck often signal a throat infection.  Lymph nodes that are swollen in more than one area often indicate an illness caused by a virus. Your caregiver will likely know what is causing your lymphadenopathy after listening to your history and examining you. Blood tests, x-rays, or other tests may be needed. If the cause of the enlarged lymph node cannot be found, and it does not go away by itself, then a biopsy may be needed. Your caregiver will discuss this with you. TREATMENT Treatment for your enlarged lymph nodes will depend on the cause. Many times the nodes will shrink to normal size by themselves, with no treatment. Antibiotics or other medicines may be needed for infection. Only take over-the-counter or prescription medicines for pain, discomfort, or fever as directed by your caregiver. HOME CARE INSTRUCTIONS Swollen lymph glands usually return to normal when the underlying medical condition goes away. If they persist, contact your health-care provider. He/she might prescribe antibiotics or other treatments, depending on the diagnosis. Take any medications exactly as prescribed. Keep any follow-up appointments made to check on the condition of your enlarged nodes. SEEK MEDICAL CARE IF:  Swelling lasts for more than two weeks.  You have symptoms such as weight loss, night sweats, fatigue, or fever that does not go away.  The lymph nodes are hard, seem fixed to the skin, or are growing rapidly.  Skin over the lymph nodes is red and inflamed. This could mean  there is an infection. SEEK IMMEDIATE MEDICAL CARE IF:  Fluid starts leaking from the area of the enlarged lymph node.  You develop a fever of 102 F (38.9 C) or greater.  Severe pain develops (not necessarily at the site of a large lymph  node).  You develop chest pain or shortness of breath.  You develop worsening abdominal pain. MAKE SURE YOU:  Understand these instructions.  Will watch your condition.  Will get help right away if you are not doing well or get worse. Document Released: 07/23/2008 Document Revised: 02/28/2014 Document Reviewed: 07/23/2008 [facility] [place] Patient Information 2015 [place], [place]. This information is not intended to replace advice given to you by your health care provider. Make sure you discuss any questions you have with your health care provider.

## 2015-06-02 LAB — ROCKY MTN SPOTTED FVR ABS PNL(IGG+IGM)
RMSF IGG: NEGATIVE
RMSF IGM: 0.52 {index} (ref 0.00–0.89)

## 2015-06-02 LAB — B. BURGDORFI ANTIBODIES: B burgdorferi Ab IgG+IgM: <0.91 {ISR} (ref 0.00–0.90)

## 2015-12-26 ENCOUNTER — Emergency Department (HOSPITAL_COMMUNITY): Payer: Self-pay

## 2015-12-26 ENCOUNTER — Encounter (HOSPITAL_COMMUNITY): Payer: Self-pay | Admitting: Emergency Medicine

## 2015-12-26 ENCOUNTER — Inpatient Hospital Stay (HOSPITAL_COMMUNITY)
Admission: EM | Admit: 2015-12-26 | Discharge: 2015-12-27 | DRG: 101 | Disposition: A | Discharge status: Home / Self Care | Payer: Self-pay | Attending: Internal Medicine | Admitting: Internal Medicine

## 2015-12-26 DIAGNOSIS — R9089 Other abnormal findings on diagnostic imaging of central nervous system: Secondary | ICD-10-CM | POA: Insufficient documentation

## 2015-12-26 DIAGNOSIS — R51 Headache: Secondary | ICD-10-CM | POA: Diagnosis present

## 2015-12-26 DIAGNOSIS — S0101XA Laceration without foreign body of scalp, initial encounter: Secondary | ICD-10-CM | POA: Diagnosis present

## 2015-12-26 DIAGNOSIS — Z820 Family history of epilepsy and other diseases of the nervous system: Secondary | ICD-10-CM

## 2015-12-26 DIAGNOSIS — Z791 Long term (current) use of non-steroidal anti-inflammatories (NSAID): Secondary | ICD-10-CM

## 2015-12-26 DIAGNOSIS — F17210 Nicotine dependence, cigarettes, uncomplicated: Secondary | ICD-10-CM | POA: Diagnosis present

## 2015-12-26 DIAGNOSIS — R569 Unspecified convulsions: Principal | ICD-10-CM

## 2015-12-26 DIAGNOSIS — G039 Meningitis, unspecified: Secondary | ICD-10-CM | POA: Diagnosis present

## 2015-12-26 DIAGNOSIS — D72829 Elevated white blood cell count, unspecified: Secondary | ICD-10-CM | POA: Diagnosis present

## 2015-12-26 DIAGNOSIS — R001 Bradycardia, unspecified: Secondary | ICD-10-CM | POA: Diagnosis present

## 2015-12-26 DIAGNOSIS — W1839XA Other fall on same level, initial encounter: Secondary | ICD-10-CM | POA: Diagnosis present

## 2015-12-26 DIAGNOSIS — R112 Nausea with vomiting, unspecified: Secondary | ICD-10-CM | POA: Diagnosis present

## 2015-12-26 DIAGNOSIS — Z87820 Personal history of traumatic brain injury: Secondary | ICD-10-CM

## 2015-12-26 LAB — BASIC METABOLIC PANEL
ANION GAP: 12 (ref 5–15)
BUN: 13 mg/dL (ref 6–20)
CO2: 24 mmol/L (ref 22–32)
Calcium: 9.8 mg/dL (ref 8.9–10.3)
Chloride: 104 mmol/L (ref 101–111)
Creatinine, Ser: 0.92 mg/dL (ref 0.61–1.24)
GLUCOSE: 111 mg/dL — AB (ref 65–99)
Potassium: 4.1 mmol/L (ref 3.5–5.1)
Sodium: 140 mmol/L (ref 135–145)

## 2015-12-26 LAB — RAPID URINE DRUG SCREEN, HOSP PERFORMED
AMPHETAMINES: NOT DETECTED
BENZODIAZEPINES: POSITIVE — AB
Barbiturates: NOT DETECTED
COCAINE: NOT DETECTED
OPIATES: NOT DETECTED
Tetrahydrocannabinol: POSITIVE — AB

## 2015-12-26 LAB — URINALYSIS, ROUTINE W REFLEX MICROSCOPIC
BILIRUBIN URINE: NEGATIVE
Glucose, UA: NEGATIVE mg/dL
Hgb urine dipstick: NEGATIVE
Ketones, ur: NEGATIVE mg/dL
LEUKOCYTES UA: NEGATIVE
NITRITE: NEGATIVE
PH: 8 (ref 5.0–8.0)
Protein, ur: NEGATIVE mg/dL
SPECIFIC GRAVITY, URINE: 1.008 (ref 1.005–1.030)

## 2015-12-26 LAB — CBC
HEMATOCRIT: 45.3 % (ref 39.0–52.0)
HEMOGLOBIN: 16.1 g/dL (ref 13.0–17.0)
MCH: 31.5 pg (ref 26.0–34.0)
MCHC: 35.5 g/dL (ref 30.0–36.0)
MCV: 88.6 fL (ref 78.0–100.0)
Platelets: 198 10*3/uL (ref 150–400)
RBC: 5.11 MIL/uL (ref 4.22–5.81)
RDW: 12.9 % (ref 11.5–15.5)
WBC: 15.2 10*3/uL — ABNORMAL HIGH (ref 4.0–10.5)

## 2015-12-26 LAB — CBG MONITORING, ED
GLUCOSE-CAPILLARY: 108 mg/dL — AB (ref 65–99)
GLUCOSE-CAPILLARY: 83 mg/dL (ref 65–99)

## 2015-12-26 LAB — GLUCOSE, CSF: GLUCOSE CSF: 65 mg/dL (ref 40–70)

## 2015-12-26 LAB — PROTEIN, CSF: TOTAL PROTEIN, CSF: 98 mg/dL — AB (ref 15–45)

## 2015-12-26 MED ORDER — NICOTINE 7 MG/24HR TD PT24
7.0000 mg | MEDICATED_PATCH | Freq: Once | TRANSDERMAL | Status: AC
  Administered 2015-12-26: 7 mg via TRANSDERMAL
  Filled 2015-12-26: qty 1

## 2015-12-26 MED ORDER — GADOBENATE DIMEGLUMINE 529 MG/ML IV SOLN
17.0000 mL | Freq: Once | INTRAVENOUS | Status: AC | PRN
  Administered 2015-12-26: 17 mL via INTRAVENOUS

## 2015-12-26 MED ORDER — BACITRACIN ZINC 500 UNIT/GM EX OINT
TOPICAL_OINTMENT | Freq: Once | CUTANEOUS | Status: AC
  Administered 2015-12-26: 1 via TOPICAL

## 2015-12-26 MED ORDER — SODIUM CHLORIDE 0.9 % IV BOLUS (SEPSIS)
1000.0000 mL | Freq: Once | INTRAVENOUS | Status: AC
  Administered 2015-12-26: 1000 mL via INTRAVENOUS

## 2015-12-26 MED ORDER — SODIUM CHLORIDE 0.9 % IV SOLN
1000.0000 mg | Freq: Once | INTRAVENOUS | Status: AC
  Administered 2015-12-26: 1000 mg via INTRAVENOUS
  Filled 2015-12-26: qty 10

## 2015-12-26 NOTE — ED Provider Notes (Signed)
Patient care assumed from [facility], PA-C at shift change pending EEG and MRI. For full HPI, see initial provider's note.  In short, patient arrived earlier after seizure. Patient was postictal afterwards which resolved in the emergency department. No history of seizures. Father and brother do have a history of seizures beginning in early 110s. While in emergency department, patient had another seizure. Neurology was consulted who recommended loading dose of Keppra and EEG and MRI. MRI shows abnormal she'll enhancement with slight subdural fluid. Radiology recommends lumbar puncture to rule out meningitis. Consulted neurology who evaluated patient in the emergency department. Neurology recommended lumbar puncture before admission to telemetry. Neurology will follow the patient.  .Lumbar Puncture Date/Time: 12/27/2015 12:52 AM Performed by: Josephina Gip Authorized by: Josephina Gip Consent: Verbal consent obtained. Risks and benefits: risks, benefits and alternatives were discussed Consent given by: patient Indications: evaluation for infection Anesthesia: local infiltration Local anesthetic: lidocaine 1% without epinephrine Anesthetic total: 2.5 ml Patient sedated: no Preparation: Patient was prepped and draped in the usual sterile fashion. Lumbar space: L3-L4 interspace Patient's position: left lateral decubitus Needle gauge: 18 Needle type: spinal needle - Quincke tip Number of attempts: 2 Opening pressure: 13 cm H2O Fluid appearance: blood-tinged Tubes of fluid: 4 Total volume: 4 ml Post-procedure: site cleaned and adhesive bandage applied Patient tolerance: Patient tolerated the procedure well with no immediate complications   Lumbar puncture performed with assistance from Dr. Ashok Cordia. Pink CSF noted. Opening pressure 13. We'll consult hospitalist for admission.   Filed Vitals:   12/26/15 2145 12/26/15 2230 12/26/15 2232 12/26/15 2245  BP: 99/44  106/47 109/60  Pulse: 85 68 58  65  Temp:      TempSrc:      Resp:  '19 21 14  '$ Height:      Weight:      SpO2: 99% 99% 98% 97%   Results for orders placed or performed during the hospital encounter of 12/26/15 (from the past 48 hour(s))  CBG monitoring, ED     Status: Abnormal   Collection Time: 12/26/15  9:29 AM  Result Value Ref Range   Glucose-Capillary 108 (H) 65 - 99 mg/dL  Basic metabolic panel - if new onset seizures     Status: Abnormal   Collection Time: 12/26/15  9:45 AM  Result Value Ref Range   Sodium 140 135 - 145 mmol/L   Potassium 4.1 3.5 - 5.1 mmol/L   Chloride 104 101 - 111 mmol/L   CO2 24 22 - 32 mmol/L   Glucose, Bld 111 (H) 65 - 99 mg/dL   BUN 13 6 - 20 mg/dL   Creatinine, Ser 0.92 0.61 - 1.24 mg/dL   Calcium 9.8 8.9 - 10.3 mg/dL   GFR calc non Af Amer >60 >60 mL/min   GFR calc Af Amer >60 >60 mL/min    Comment: (NOTE) The eGFR has been calculated using the CKD EPI equation. This calculation has not been validated in all clinical situations. eGFR's persistently <60 mL/min signify possible Chronic Kidney Disease.    Anion gap 12 5 - 15  CBC - if new onset seizures     Status: Abnormal   Collection Time: 12/26/15  9:45 AM  Result Value Ref Range   WBC 15.2 (H) 4.0 - 10.5 K/uL   RBC 5.11 4.22 - 5.81 MIL/uL   Hemoglobin 16.1 13.0 - 17.0 g/dL   HCT 45.3 39.0 - 52.0 %   MCV 88.6 78.0 - 100.0 fL   MCH 31.5 26.0 -  34.0 pg   MCHC 35.5 30.0 - 36.0 g/dL   RDW 12.9 11.5 - 15.5 %   Platelets 198 150 - 400 K/uL  CBG monitoring, ED     Status: None   Collection Time: 12/26/15 10:59 AM  Result Value Ref Range   Glucose-Capillary 83 65 - 99 mg/dL  Urinalysis, Routine w reflex microscopic     Status: None   Collection Time: 12/26/15  1:35 PM  Result Value Ref Range   Color, Urine YELLOW YELLOW   APPearance CLEAR CLEAR   Specific Gravity, Urine 1.008 1.005 - 1.030   pH 8.0 5.0 - 8.0   Glucose, UA NEGATIVE NEGATIVE mg/dL   Hgb urine dipstick NEGATIVE NEGATIVE   Bilirubin Urine NEGATIVE  NEGATIVE   Ketones, ur NEGATIVE NEGATIVE mg/dL   Protein, ur NEGATIVE NEGATIVE mg/dL   Nitrite NEGATIVE NEGATIVE   Leukocytes, UA NEGATIVE NEGATIVE    Comment: MICROSCOPIC NOT DONE ON URINES WITH NEGATIVE PROTEIN, BLOOD, LEUKOCYTES, NITRITE, OR GLUCOSE <1000 mg/dL.  Urine rapid drug screen (hosp performed)     Status: Abnormal   Collection Time: 12/26/15  1:35 PM  Result Value Ref Range   Opiates NONE DETECTED NONE DETECTED   Cocaine NONE DETECTED NONE DETECTED   Benzodiazepines POSITIVE (A) NONE DETECTED   Amphetamines NONE DETECTED NONE DETECTED   Tetrahydrocannabinol POSITIVE (A) NONE DETECTED   Barbiturates NONE DETECTED NONE DETECTED    Comment:        DRUG SCREEN FOR MEDICAL PURPOSES ONLY.  IF CONFIRMATION IS NEEDED FOR ANY PURPOSE, NOTIFY LAB WITHIN 5 DAYS.        LOWEST DETECTABLE LIMITS FOR URINE DRUG SCREEN Drug Class       Cutoff (ng/mL) Amphetamine      1000 Barbiturate      200 Benzodiazepine   759 Tricyclics       163 Opiates          300 Cocaine          300 THC              50   CSF cell count with differential collection tube #: 1     Status: Abnormal   Collection Time: 12/26/15 10:19 PM  Result Value Ref Range   Tube # 1    Color, CSF PINK (A) COLORLESS   Appearance, CSF CLOUDY (A) CLEAR   Supernatant CLEAR    RBC Count, CSF 18575 (H) 0 /cu mm   WBC, CSF 3 0 - 5 /cu mm   Segmented Neutrophils-CSF TOO FEW TO COUNT, SMEAR AVAILABLE FOR REVIEW 0 - 6 %   Lymphs, CSF TOO FEW TO COUNT, SMEAR AVAILABLE FOR REVIEW 40 - 80 %   Monocyte-Macrophage-Spinal Fluid TOO FEW TO COUNT, SMEAR AVAILABLE FOR REVIEW 15 - 45 %   Eosinophils, CSF TOO FEW TO COUNT, SMEAR AVAILABLE FOR REVIEW 0 - 1 %   Other Cells, CSF FEW SEGMENTED NEUTROPHILS AND LYMPHOCYTES   CSF cell count with differential collection tube #: 4     Status: Abnormal   Collection Time: 12/26/15 10:19 PM  Result Value Ref Range   Tube # 4    Color, CSF PINK (A) COLORLESS   Appearance, CSF CLOUDY (A) CLEAR    Supernatant CLEAR    RBC Count, CSF 26725 (H) 0 /cu mm   WBC, CSF 9 (H) 0 - 5 /cu mm   Segmented Neutrophils-CSF TOO FEW TO COUNT, SMEAR AVAILABLE FOR REVIEW 0 - 6 %  Lymphs, CSF TOO FEW TO COUNT, SMEAR AVAILABLE FOR REVIEW 40 - 80 %   Monocyte-Macrophage-Spinal Fluid TOO FEW TO COUNT, SMEAR AVAILABLE FOR REVIEW 15 - 45 %   Eosinophils, CSF TOO FEW TO COUNT, SMEAR AVAILABLE FOR REVIEW 0 - 1 %   Other Cells, CSF FEW SEGMENTED NEUTROPHILS   CSF culture     Status: None (Preliminary result)   Collection Time: 12/26/15 10:19 PM  Result Value Ref Range   Specimen Description BACK    Special Requests NONE    Gram Stain      CYTOSPIN SMEAR WBC PRESENT, PREDOMINANTLY PMN NO ORGANISMS SEEN    Culture PENDING    Report Status PENDING   Glucose, CSF     Status: None   Collection Time: 12/26/15 10:19 PM  Result Value Ref Range   Glucose, CSF 65 40 - 70 mg/dL  Protein, CSF     Status: Abnormal   Collection Time: 12/26/15 10:19 PM  Result Value Ref Range   Total  Protein, CSF 98 (H) 15 - 45 mg/dL   MR Brain W Wo Contrast (Final result) Result time: 12/26/15 16:42:12   Final result by Rad Results In Interface (12/26/15 16:42:12)   Narrative:   CLINICAL DATA: Patient fell down and hit head. Subsequent seizure. Postictal on arrival, with elevated white count. No prior history of seizures.  EXAM: MRI HEAD WITHOUT AND WITH CONTRAST  TECHNIQUE: Multiplanar, multiecho pulse sequences of the brain and surrounding structures were obtained without and with intravenous contrast.  CONTRAST: 60m MULTIHANCE GADOBENATE DIMEGLUMINE 529 MG/ML IV SOLN  COMPARISON: CT head earlier today.  FINDINGS: No restricted diffusion. No intra-axial shearing injury or mass lesion. No hydrocephalus.  Abnormal extra-axial T2 hyperintense fluid collection, maximal over the RIGHT frontal lobe up to 3 mm thick. 2 mm on the LEFT. Faintly visible as an extra-axial collection on gradient sequence  but without significant susceptibility. Difficult to see on FLAIR imaging. No significant T1 shortening. On coronal T2 images is greater on the RIGHT.  Abnormal postcontrast enhancement of the dura, not only in the frontal region, but in the middle cranial fossa asymmetric to the RIGHT. No leptomeningeal or parenchymal enhancement.  Major dural venous sinuses are patent. No sinus or mastoid disease. Negative orbits.  Cervical lymph nodes, incompletely evaluated. Posterior scalp hematoma greatest in the RIGHT paramedian parietal scalp region.  IMPRESSION: Abnormal pachymeningeal enhancement associated with slight subdural fluid, greatest in the frontal region on the RIGHT. Occult bacterial or viral meningitis should be excluded with lumbar puncture. Acute subdural hematoma is not favored, given the lack of findings on CT and lack of significant findings on T1, gradient, and T2 images.  Acute subdural hygroma could have this appearance in the setting of trauma, but would be unusual to show postcontrast enhancement.   Electronically Signed By: JStaci RighterM.D. On: 12/26/2015 16:42          CT Head Wo Contrast (Final result) Result time: 12/26/15 12:12:09   Final result by Rad Results In Interface (12/26/15 12:12:09)   Narrative:   CLINICAL DATA: Seizure. Hit head.  EXAM: CT HEAD WITHOUT CONTRAST  TECHNIQUE: Contiguous axial images were obtained from the base of the skull through the vertex without intravenous contrast.  COMPARISON: None.  FINDINGS: No acute cortical infarct, hemorrhage, or mass lesion ispresent. Ventricles are of normal size. No significant extra-axial fluid collection is present. The paranasal sinuses andmastoid air cells are clear. The osseous skull is intact.  IMPRESSION: Normal brain.  Electronically Signed By: Kerby Moors M.D. On: 12/26/2015 12:12          DG Chest 2 View (Final result) Result time: 12/26/15  11:03:46   Final result by Rad Results In Interface (12/26/15 11:03:46)   Narrative:   CLINICAL DATA: Seizure today.  EXAM: CHEST 2 VIEW  COMPARISON: None.  FINDINGS: The heart size and mediastinal contours are within normal limits. Both lungs are clear. No evidence of pneumothorax or pleural effusion. The visualized skeletal structures are unremarkable.  IMPRESSION: Negative. No active cardiopulmonary disease.   Electronically Signed By: Earle Gell M.D. On: 12/26/2015 11:03     Josephina Gip, PA-C 12/27/15 0054  Lajean Saver, MD 12/28/15 1451

## 2015-12-26 NOTE — ED Notes (Addendum)
Pt arrives from home via [facility].  EMS reports pt was standing by car, fell down, hit head, had seizure.  EMS reports pt post-ictal upon arrival.  Pt denies hx seizure, reports family hx.   Pt reports n/v during night, reports feeling "fine" this am prior to incident. EMS reports giving 4 mg zofran en route.  Pt AOx4.  Lac noted to back of head, oral trauma noted.  Pt denies incontinence.

## 2015-12-26 NOTE — Procedures (Signed)
   Current facility-administered medications:  .  nicotine (NICODERM CQ - dosed in mg/24 hr) patch 7 mg, 7 mg, Transdermal, Once, [facility], PA-C, 7 mg at 12/26/15 1438  Current outpatient prescriptions:  .  bacitracin ointment, Apply 1 application topically 2 (two) times daily., Disp: 120 g, Rfl: 0 .  hydrOXYzine (ATARAX/VISTARIL) 25 MG tablet, Take 1 tablet (25 mg total) by mouth every 6 (six) hours as needed for itching. (Patient not taking: Reported on 05/29/2015), Disp: 15 tablet, Rfl: 0 .  naproxen (NAPROSYN) 500 MG tablet, Take 1 tablet (500 mg total) by mouth 2 (two) times daily with a meal., Disp: 30 tablet, Rfl: 0 .  ondansetron (ZOFRAN ODT) 4 MG disintegrating tablet, Take 1 tablet (4 mg total) by mouth every 8 (eight) hours as needed for nausea. (Patient not taking: Reported on 05/29/2015), Disp: 10 tablet, Rfl: 0 .  promethazine (PHENERGAN) 25 MG tablet, Take 1 tablet (25 mg total) by mouth every 6 (six) hours as needed for nausea or vomiting. (Patient not taking: Reported on 05/29/2015), Disp: 12 tablet, Rfl: 0  Introduction:  This is a 19 channel routine scalp EEG performed at the bedside with bipolar and monopolar montages arranged in accordance to the international 10/20 system of electrode placement. One channel was dedicated to EKG recording.    Findings:  The background rhythm was normal 9-10 Hz alpha . Intermittent spike and slow wave discharges predominately arising from the right parietotemporal location, and intermittent right temporal sharps with phase reversals were noted.  No evidence of electrographic seizures were noted during this recording.   Impression:  This is an abnormal routine inpatient EEG, with intermittent abnormal epileptiform discharges noted arising from the right parietotemporal region, suggestive of underlying epileptogenic potential. No evidence of electrographic seizures were seen during this recording.  Clinical correlation is recommended .

## 2015-12-26 NOTE — ED Provider Notes (Signed)
CSN: [id number]     Arrival date & time 12/26/15  0845 History   First MD Initiated Contact with Patient 12/26/15 [phone]     Chief Complaint  Patient presents with  . Seizures     (Consider location/radiation/quality/duration/timing/severity/associated sxs/prior Treatment) Patient is a 20 y.o. male presenting with seizures. The history is provided by the patient and medical records. No language interpreter was used.  Seizures  Jaime Nixon is a 20 y.o. male  with no pertinent PMH who presents to the Emergency Department via EMS after seizure-like activity just PTA. Per family at bedside, patient was outside standing when his muscles tensed up and he became unresponsive - immediately falling to the ground hitting the back of his head. Family describes generalized body shaking and that patient bit tongue during incident. Per EMS report, post-ictal upon their arrival. Upon arrival to ED patient was alert and oriented. Denies b/b incontinence, denies fever. Patient admits that over the last 24 hours, he had n/v but felt improved upon awakening today. No recent medications or medication changes. No history of seizures in the past. Father and brother with hx of seizures which began in early 20's.   History reviewed. No pertinent past medical history. History reviewed. No pertinent past surgical history. No family history on file. Social History  Substance Use Topics  . Smoking status: Current Every Day Smoker -- 1.00 packs/day    Types: Cigarettes  . Smokeless tobacco: None  . Alcohol Use: No    Review of Systems  Constitutional: Negative for fever and chills.  HENT: Negative for congestion.   Eyes: Positive for visual disturbance (blurry).  Respiratory: Negative for cough and shortness of breath.   Cardiovascular: Negative for chest pain.  Gastrointestinal: Positive for nausea and vomiting. Negative for abdominal pain.  Genitourinary: Negative for dysuria.  Musculoskeletal: Negative for  back pain.  Skin: Positive for wound (Head lac).  Neurological: Positive for seizures.      Allergies  Dexamethasone and Prednisone  Home Medications   Prior to Admission medications   Medication Sig Start Date End Date Taking? Authorizing Provider  bacitracin ointment Apply 1 application topically 2 (two) times daily. 05/31/15   Roxy Horseman, PA-C  hydrOXYzine (ATARAX/VISTARIL) 25 MG tablet Take 1 tablet (25 mg total) by mouth every 6 (six) hours as needed for itching. Patient not taking: Reported on 05/29/2015 05/24/13   Garlon Hatchet, PA-C  naproxen (NAPROSYN) 500 MG tablet Take 1 tablet (500 mg total) by mouth 2 (two) times daily with a meal. 06/01/15   Francee Piccolo, PA-C  ondansetron (ZOFRAN ODT) 4 MG disintegrating tablet Take 1 tablet (4 mg total) by mouth every 8 (eight) hours as needed for nausea. Patient not taking: Reported on 05/29/2015 12/05/14   Eber Hong, MD  promethazine (PHENERGAN) 25 MG tablet Take 1 tablet (25 mg total) by mouth every 6 (six) hours as needed for nausea or vomiting. Patient not taking: Reported on 05/29/2015 12/05/14   Eber Hong, MD   BP 103/48 mmHg  Pulse 79  Temp(Src) 98.1 F (36.7 C) (Oral)  Resp 19  Ht 6\' 1"  (1.854 m)  Wt 77.111 kg  BMI 22.43 kg/m2  SpO2 100% Physical Exam  Constitutional: He is oriented to person, place, and time. He appears well-developed and well-nourished.  Alert and in no acute distress  HENT:  Head: Head is without raccoon's eyes and without Battle's sign.    Right Ear: No hemotympanum.  Left Ear: No hemotympanum.  Mouth/Throat: Uvula  is midline and oropharynx is clear and moist.  + oral trauma noted - tongue bite mark 1 cm head lac as depicted in image.   Eyes: Conjunctivae and EOM are normal. Pupils are equal, round, and reactive to light.  Neck: Normal range of motion. Neck supple.  No nuchal rigidity or meningeal signs.   Cardiovascular: Normal rate, regular rhythm, normal heart sounds and intact  distal pulses.  Exam reveals no gallop and no friction rub.   No murmur heard. Pulmonary/Chest: Effort normal and breath sounds normal. No respiratory distress. He has no wheezes. He has no rales.  Abdominal: Soft. Bowel sounds are normal. He exhibits no distension and no mass. There is no tenderness. There is no rebound and no guarding.  Musculoskeletal: He exhibits no edema.  Neurological: He is alert and oriented to person, place, and time.  Alert, oriented, thought content appropriate. Speech is clear and goal oriented, able to follow commands.  Cranial Nerves:  II:  Pupils equal, round, reactive to light III, IV, VI: EOM intact bilaterally, ptosis not present V,VII: smile symmetric, eyes kept closed tightly against resistance, facial light touch sensation equal VIII: hearing grossly normal IX, X: symmetric soft palate movement, uvula elevates symmetrically  XI: bilateral shoulder shrug symmetric and strong XII: midline tongue extension 5/5 muscle strength in upper and lower extremities bilaterally including strong and equal grip strength and dorsiflexion/plantar flexion Sensory to light touch normal in all four extremities.  Normal finger-to-nose and rapid alternating movements;   Skin: Skin is warm and dry. No rash noted.  Psychiatric: He has a normal mood and affect. His behavior is normal. Judgment and thought content normal.  Nursing note and vitals reviewed.   ED Course  Procedures (including critical care time) Labs Review Labs Reviewed  BASIC METABOLIC PANEL - Abnormal; Notable for the following:    Glucose, Bld 111 (*)    All other components within normal limits  CBC - Abnormal; Notable for the following:    WBC 15.2 (*)    All other components within normal limits  CBG MONITORING, ED - Abnormal; Notable for the following:    Glucose-Capillary 108 (*)    All other components within normal limits  URINALYSIS, ROUTINE W REFLEX MICROSCOPIC (NOT AT [facility])  CBG  MONITORING, ED    Imaging Review Dg Chest 2 View  12/26/2015  CLINICAL DATA:  Seizure today. EXAM: CHEST  2 VIEW COMPARISON:  None. FINDINGS: The heart size and mediastinal contours are within normal limits. Both lungs are clear. No evidence of pneumothorax or pleural effusion. The visualized skeletal structures are unremarkable. IMPRESSION: Negative.  No active cardiopulmonary disease. Electronically Signed   By: Myles Rosenthal M.D.   On: 12/26/2015 11:03   Ct Head Wo Contrast  12/26/2015  CLINICAL DATA:  Seizure.  Hit head. EXAM: CT HEAD WITHOUT CONTRAST TECHNIQUE: Contiguous axial images were obtained from the base of the skull through the vertex without intravenous contrast. COMPARISON:  None. FINDINGS: No acute cortical infarct, hemorrhage, or mass lesion ispresent. Ventricles are of normal size. No significant extra-axial fluid collection is present. The paranasal sinuses andmastoid air cells are clear. The osseous skull is intact. IMPRESSION: Normal brain. Electronically Signed   By: Signa Kell M.D.   On: 12/26/2015 12:12   I have personally reviewed and evaluated these images and lab results as part of my medical decision-making.   EKG Interpretation   Date/Time:  Tuesday December 26 2015 08:52:14 EST Ventricular Rate:  74 PR Interval:  120 QRS Duration: 82 QT Interval:  360 QTC Calculation: 399 R Axis:   69 Text Interpretation:  Sinus rhythm No acute changes Confirmed by LIU MD,  DANA [phone]) on 12/26/2015 12:31:43 PM      MDM   Final diagnoses:  Seizure (HCC)  Jaime Nixon presents via EMS for seizure-like activity just PTA. No history of prior seizures.   Labs: CBC with white count of 15.2; BMP, UA, and CBG reassuring.  Imaging: CXR wdl. EKG reviewed. CT head with no acute findings.   11:10 AM - While going for head CT patient had another episode of seizure like activity. On re-evaluation, patient currently post-ictal, but arousable - able to answer simple questions.    Patient re-evaluated and back to baseline. Neurology consulted who recommends loading dose of Keppra.  Obtaining EEG and MRI in ED, if normal discharge on Keppra  BID and neuro follow up ([facility]) in 1 week.   MR and EEG pending at shift change. Care assumed by oncoming provider PA Barrett, case discussed, plan agreed upon. Plan is to follow up on MR and EEG - if normal home on keppra 500 BID and neuro follow up in 1 week per neuro recommendations. After MRI is performed, head lac needs to be repaired.   Patient seen by and discussed with Dr. Verdie Mosher who agrees with treatment plan.   [facility] Ward, PA-C 12/26/15 1614  Lavera Guise, MD 12/27/15 [phone]

## 2015-12-26 NOTE — ED Notes (Signed)
MD, PA at bedside

## 2015-12-26 NOTE — ED Notes (Signed)
J. Ward, PA at bedside. 

## 2015-12-26 NOTE — ED Notes (Signed)
MDs at bedside performing LP.

## 2015-12-26 NOTE — ED Notes (Signed)
Pt given cup of water and happy meal per order from Dr. Denton Lank

## 2015-12-26 NOTE — ED Notes (Signed)
Patient transported to MRI 

## 2015-12-26 NOTE — ED Notes (Signed)
MD at bedside. 

## 2015-12-26 NOTE — Progress Notes (Signed)
EEG completed, results pending. 

## 2015-12-26 NOTE — Consult Note (Signed)
Reason for Consult: Seizure and abnormal brain scan  Referring Physician: Crista Curb, M.D.    HPI: Jaime Nixon is a 20 y.o.  Male with history of a Traumatic brain injury 5-10 years ago and family history of seizures who presents for new onset seizures. The patient developed a GI illness last evening after eating. He believed that he probably had food poisoning. He was vomiting multiple times. Then, he went to work despite not feeling well. He was standing beside a pickup truck next to his girlfriend's father when he suddenly became stiff and fell backwards striking back of his head on cement. He then had a convulsion for 1-2 mins and prolonged postictal phase. He bit his tongue. No urinary incontinence. He suffered an occipital scalp laceration.   Upon questioning, the patient suffers from chronic daily headaches. No rhinorrhea. He has a stiff neck in the setting of this head injury.   MRI HEAD WITHOUT AND WITH CONTRAST  TECHNIQUE: Multiplanar, multiecho pulse sequences of the brain and surrounding structures were obtained without and with intravenous contrast.  CONTRAST: 17mL MULTIHANCE GADOBENATE DIMEGLUMINE 529 MG/ML IV SOLN  COMPARISON: CT head earlier today.  FINDINGS: No restricted diffusion. No intra-axial shearing injury or mass lesion. No hydrocephalus.  Abnormal extra-axial T2 hyperintense fluid collection, maximal over the RIGHT frontal lobe up to 3 mm thick. 2 mm on the LEFT. Faintly visible as an extra-axial collection on gradient sequence but without significant susceptibility. Difficult to see on FLAIR imaging. No significant T1 shortening. On coronal T2 images is greater on the RIGHT.  Abnormal postcontrast enhancement of the dura, not only in the frontal region, but in the middle cranial fossa asymmetric to the RIGHT. No leptomeningeal or parenchymal enhancement.  Major dural venous sinuses are patent. No sinus or mastoid disease. Negative  orbits.  Cervical lymph nodes, incompletely evaluated. Posterior scalp hematoma greatest in the RIGHT paramedian parietal scalp region.  IMPRESSION: Abnormal pachymeningeal enhancement associated with slight subdural fluid, greatest in the frontal region on the RIGHT. Occult bacterial or viral meningitis should be excluded with lumbar puncture. Acute subdural hematoma is not favored, given the lack of findings on CT and lack of significant findings on T1, gradient, and T2 images.  Acute subdural hygroma could have this appearance in the setting of trauma, but would be unusual to show postcontrast enhancement.    History reviewed. No pertinent past medical history.   History reviewed. No pertinent past surgical history.  FHX: Brother and Father with seizures    Social History   Social History  . Marital Status: Single    Spouse Name: N/A  . Number of Children: N/A  . Years of Education: N/A   Occupational History  . Not on file.   Social History Main Topics  . Smoking status: Current Every Day Smoker -- 1.00 packs/day    Types: Cigarettes  . Smokeless tobacco: Not on file  . Alcohol Use: No  . Drug Use: Yes    Special: Marijuana     Comment: every other day  . Sexual Activity: Not on file   Other Topics Concern  . Not on file   Social History Narrative    Allergies  Allergen Reactions  . Dexamethasone Other (See Comments)    "Difficulty breathing, anger"  . Prednisone Other (See Comments)    Irritated    No current facility-administered medications on file prior to encounter.   Current Outpatient Prescriptions on File Prior to Encounter  Medication Sig Dispense Refill  .  bacitracin ointment Apply 1 application topically 2 (two) times daily. 120 g 0  . hydrOXYzine (ATARAX/VISTARIL) 25 MG tablet Take 1 tablet (25 mg total) by mouth every 6 (six) hours as needed for itching. (Patient not taking: Reported on 05/29/2015) 15 tablet 0  . naproxen (NAPROSYN)  500 MG tablet Take 1 tablet (500 mg total) by mouth 2 (two) times daily with a meal. 30 tablet 0  . ondansetron (ZOFRAN ODT) 4 MG disintegrating tablet Take 1 tablet (4 mg total) by mouth every 8 (eight) hours as needed for nausea. (Patient not taking: Reported on 05/29/2015) 10 tablet 0  . promethazine (PHENERGAN) 25 MG tablet Take 1 tablet (25 mg total) by mouth every 6 (six) hours as needed for nausea or vomiting. (Patient not taking: Reported on 05/29/2015) 12 tablet 0          Physical Exam:  General: BP 107/56 mmHg  Pulse 64  Temp(Src) 98.2 F (36.8 C) (Oral)  Resp 17  Ht  (1.854 m)  Wt 77.111 kg (170 lb)  BMI 22.43 kg/m2  SpO2 99%  CV: RRR no mumurs. Carotids no bruit bilaterally HEENT:  Fundoscopic exam reveals no papilledema. Occipital scalp laceration with blood.  Mental Status: Alert. Oriented. Fluent. Appropriate. Names. Repeats. Follows commands.  Cranial Nerves: PEARRL, EOMI, VFF, Facial sensation intact to temperature. No facial droop. Hearing intact bilaterally. Voice is not hoarse. Tongue protrudes midline. Motor: No drift of either arm or leg extended. Normal muscle bulk, power and tone. Sensory: Equal to light touch and vibration throughout.  Coord: Finger to nose intact. Heel to shin intact.  Gait: Narrow based.    Assessment: 1. New onset seizures 2. 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 Subdural    Recommendations: 1. LP to measure opening pressure and check for CSF cell count, differential, protein, glucose, Bacterial culture and gram stain 2. Keppra  bid 3. Seizure precauations 4. Consult [facility] to admit to telemetry monitored bed 5. Ativan  IV Q15 mins prn seizure 6. No driving x 6 months. Pt is aware 7. If opening pressure on LP is below 8cm of H2O then the patient will need further workup to identify the location of a possible CSF leak. 8. If CSF white cell count  is elevated, he may need antibiotics. This depends upon the count and differential.  9. Smoking cessation 10. UrineTox screen

## 2015-12-27 ENCOUNTER — Encounter (HOSPITAL_COMMUNITY): Payer: Self-pay | Admitting: Internal Medicine

## 2015-12-27 DIAGNOSIS — R930 Abnormal findings on diagnostic imaging of skull and head, not elsewhere classified: Secondary | ICD-10-CM

## 2015-12-27 DIAGNOSIS — R9089 Other abnormal findings on diagnostic imaging of central nervous system: Secondary | ICD-10-CM | POA: Insufficient documentation

## 2015-12-27 DIAGNOSIS — D72829 Elevated white blood cell count, unspecified: Secondary | ICD-10-CM | POA: Diagnosis present

## 2015-12-27 DIAGNOSIS — S0101XA Laceration without foreign body of scalp, initial encounter: Secondary | ICD-10-CM | POA: Diagnosis present

## 2015-12-27 LAB — COMPREHENSIVE METABOLIC PANEL
ALBUMIN: 4 g/dL (ref 3.5–5.0)
ALK PHOS: 63 U/L (ref 38–126)
ALT: 8 U/L — ABNORMAL LOW (ref 17–63)
ANION GAP: 11 (ref 5–15)
AST: 17 U/L (ref 15–41)
BUN: 9 mg/dL (ref 6–20)
CO2: 25 mmol/L (ref 22–32)
Calcium: 9.8 mg/dL (ref 8.9–10.3)
Chloride: 106 mmol/L (ref 101–111)
Creatinine, Ser: 0.85 mg/dL (ref 0.61–1.24)
GFR calc non Af Amer: >60 mL/min (ref >60)
GLUCOSE: 104 mg/dL — AB (ref 65–99)
POTASSIUM: 4.1 mmol/L (ref 3.5–5.1)
SODIUM: 142 mmol/L (ref 135–145)
Total Bilirubin: 0.5 mg/dL (ref 0.3–1.2)
Total Protein: 6.4 g/dL — ABNORMAL LOW (ref 6.5–8.1)

## 2015-12-27 LAB — CSF CELL COUNT WITH DIFFERENTIAL
RBC COUNT CSF: 18575 /mm3 — AB
RBC COUNT CSF: 26725 /mm3 — AB
Tube #: 1
Tube #: 4
WBC CSF: 3 /mm3 (ref 0–5)
WBC CSF: 9 /mm3 — AB (ref 0–5)

## 2015-12-27 LAB — CBC
HEMATOCRIT: 43.2 % (ref 39.0–52.0)
HEMOGLOBIN: 14.4 g/dL (ref 13.0–17.0)
MCH: 30.1 pg (ref 26.0–34.0)
MCHC: 33.3 g/dL (ref 30.0–36.0)
MCV: 90.2 fL (ref 78.0–100.0)
Platelets: 188 10*3/uL (ref 150–400)
RBC: 4.79 MIL/uL (ref 4.22–5.81)
RDW: 13.2 % (ref 11.5–15.5)
WBC: 10.1 10*3/uL (ref 4.0–10.5)

## 2015-12-27 LAB — LIPASE, BLOOD: Lipase: 20 U/L (ref 11–51)

## 2015-12-27 LAB — GLUCOSE, CAPILLARY: Glucose-Capillary: 92 mg/dL (ref 65–99)

## 2015-12-27 MED ORDER — ONDANSETRON HCL 4 MG PO TABS: 4.0000 mg | ORAL_TABLET | Freq: Four times a day (QID) | ORAL | Status: DC | PRN

## 2015-12-27 MED ORDER — ONDANSETRON HCL 4 MG/2ML IJ SOLN: 4.0000 mg | Freq: Four times a day (QID) | INTRAMUSCULAR | Status: DC | PRN

## 2015-12-27 MED ORDER — SODIUM CHLORIDE 0.9% FLUSH
3.0000 mL | Freq: Two times a day (BID) | INTRAVENOUS | Status: DC
  Administered 2015-12-27: 3 mL via INTRAVENOUS

## 2015-12-27 MED ORDER — SODIUM CHLORIDE 0.9 % IV SOLN
INTRAVENOUS | Status: DC
  Administered 2015-12-27 (×2): via INTRAVENOUS

## 2015-12-27 MED ORDER — NICOTINE 14 MG/24HR TD PT24
14.0000 mg | MEDICATED_PATCH | Freq: Every day | TRANSDERMAL | Status: DC
  Administered 2015-12-27: 14 mg via TRANSDERMAL
  Filled 2015-12-27: qty 1

## 2015-12-27 MED ORDER — LORAZEPAM 2 MG/ML IJ SOLN: 2.0000 mg | INTRAMUSCULAR | Status: DC | PRN

## 2015-12-27 MED ORDER — BACITRACIN-NEOMYCIN-POLYMYXIN OINTMENT TUBE
TOPICAL_OINTMENT | Freq: Every day | CUTANEOUS | Status: DC
  Administered 2015-12-27: 11:00:00 via TOPICAL
  Filled 2015-12-27 (×2): qty 15

## 2015-12-27 MED ORDER — NICOTINE 21 MG/24HR TD PT24: 21.0000 mg | MEDICATED_PATCH | Freq: Every day | TRANSDERMAL | Status: DC

## 2015-12-27 MED ORDER — NAPROXEN 250 MG PO TABS
500.0000 mg | ORAL_TABLET | Freq: Two times a day (BID) | ORAL | Status: DC
  Administered 2015-12-27 (×2): 500 mg via ORAL
  Filled 2015-12-27 (×3): qty 2

## 2015-12-27 MED ORDER — SODIUM CHLORIDE 0.9 % IV SOLN
1000.0000 mg | Freq: Two times a day (BID) | INTRAVENOUS | Status: DC
  Administered 2015-12-27 (×2): 1000 mg via INTRAVENOUS
  Filled 2015-12-27 (×3): qty 10

## 2015-12-27 MED ORDER — ACETAMINOPHEN 650 MG RE SUPP: 650.0000 mg | Freq: Four times a day (QID) | RECTAL | Status: DC | PRN

## 2015-12-27 MED ORDER — ACETAMINOPHEN 325 MG PO TABS
650.0000 mg | ORAL_TABLET | Freq: Four times a day (QID) | ORAL | Status: DC | PRN
  Administered 2015-12-27: 650 mg via ORAL
  Filled 2015-12-27: qty 2

## 2015-12-27 MED ORDER — LEVETIRACETAM 500 MG PO TABS: 500.0000 mg | ORAL_TABLET | Freq: Two times a day (BID) | ORAL | Status: DC

## 2015-12-27 NOTE — H&P (Addendum)
[facility] History and Physical  Jaime Nixon KGM:[id number] DOB: 06-Aug-1996 DOA: 12/26/2015  Referring physician: ED physician PCP: No PCP Per Patient  Specialists:   Chief Complaint: seizure, nausea and vomiting  HPI: Jaime Nixon is a 20 y.o. 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  Patient reports that he had nausea and vomiting last evening. He vomited several times. He does not have abdominal pain or diarrhea. His nausea and vomiting has resolved. He states that he had 2 episode of seizure in this morning. At about 7;30 in AM, he suddenly became stiff and fell backwards, striking back of his head on cement. The episode lasted for 1-2 min. Another episode happened at about 10:30 AM. He had small scalp laceration over occipital area. When I saw pt in ED, his Mental status is normal. He does not have unilateral weakness, chest pain, cough, abdominal pain, symptoms of UTI. Denies nausea or vomiting.  In ED, patient was found to have WBC 15.2, temperature normal, mildly bradycardia, electrolytes and renal function okay, negative chest x-ray, negative CT head for acute intracranial abnormalities. MRI showed abnormal pachymeningeal enhancement associated with slight subdural fluid, greatest in the frontal region on the RIGHT. Patient is admitted to inpatient for further eval and treatment. Neurology was consulted, EEG was performed, which is suggestive of underlying epileptogenic potential per Dr. Laurell Josephs. Lumbar puncture is performed in the ED.   EKG: Independently reviewed.QTC 399, no ischemic change.  Where does patient live?   At home  Can patient participate in ADLs?  Yes    Review of Systems:   General: no fevers, chills, no changes in body weight, has fatigue HEENT: no blurry vision, hearing changes or sore throat Pulm: no dyspnea, coughing, wheezing CV: no chest pain,  no palpitations Abd: had nausea, vomiting, no abdominal pain, diarrhea, constipation GU: no dysuria, burning on urination, increased urinary frequency, hematuria  Ext: no leg edema Neuro: no unilateral weakness, numbness, or tingling, no vision change or hearing loss. Had seizure. Skin: no rash MSK: No muscle spasm, no deformity, no limitation of range of movement in spin Heme: No easy bruising.  Travel history: No recent long distant travel.  Allergy:  Allergies  Allergen Reactions  . Dexamethasone Other (See Comments)    "Difficulty breathing, anger"  . Prednisone Other (See Comments)    Irritated    History reviewed. No pertinent past medical history.  History reviewed. No pertinent past surgical history.  Social History:  reports that he has been smoking Cigarettes.  He has been smoking about 1.00 pack per day. He does not have any smokeless tobacco history on file. He reports that he uses illicit drugs (Marijuana). He reports that he does not drink alcohol.  Family History:  Family History  Problem Relation Age of Onset  . Seizures Father   . Seizures Brother      Prior to Admission medications   Medication Sig Start Date End Date Taking? Authorizing Provider  bacitracin ointment Apply 1 application topically 2 (two) times daily. 05/31/15   Roxy Horseman, PA-C  hydrOXYzine (ATARAX/VISTARIL) 25 MG tablet Take 1 tablet (25 mg total) by mouth every 6 (six) hours as needed for itching. Patient not taking: Reported on 05/29/2015 05/24/13   Garlon Hatchet, PA-C  naproxen (NAPROSYN) 500 MG tablet Take 1 tablet (500 mg total) by mouth 2 (two) times daily with a meal.  06/01/15   Jennifer Piepenbrink, PA-C  ondansetron (ZOFRAN ODT) 4 MG disintegrating tablet Take 1 tablet (4 mg total) by mouth every 8 (eight) hours as needed for nausea. Patient not taking: Reported on 05/29/2015 12/05/14   Eber Hong, MD  promethazine (PHENERGAN) 25 MG tablet Take 1 tablet (25 mg total) by mouth every 6  (six) hours as needed for nausea or vomiting. Patient not taking: Reported on 05/29/2015 12/05/14   Eber Hong, MD    Physical Exam: Filed Vitals:   12/26/15 2245 12/27/15 0030 12/27/15 0045 12/27/15 0239  BP: 109/60 108/62 107/59 116/54  Pulse: 65 62 69 64  Temp:    98 F (36.7 C)  TempSrc:    Oral  Resp: 14 22  20   Height:      Weight:      SpO2: 97% 98% 98% 100%   General: Not in acute distress HEENT:       Eyes: PERRL, EOMI, no scleral icterus.       ENT: No discharge from the ears and nose, no pharynx injection, no tonsillar enlargement.        Neck: No JVD, no bruit, no mass felt. Heme: No neck lymph node enlargement. Cardiac: S1/S2, RRR, No murmurs, No gallops or rubs. Pulm: No rales, wheezing, rhonchi or rubs. Abd: Soft, nondistended, nontender, no rebound pain, no organomegaly, BS present. Ext: No pitting leg edema bilaterally. 2+DP/PT pulse bilaterally. Musculoskeletal: No joint deformities, No joint redness or warmth, no limitation of ROM in spin. Skin: there is a small scalp laceration over occipital area, which is sutured. No active bleeding. Neuro: Alert, oriented X3, cranial nerves II-XII grossly intact, moves all extremities normally. Muscle strength 5/5 in all extremities, sensation to light touch intact. Knee reflex 1+ bilaterally. Negative Babinski's sign. Normal finger to nose test. Neck is supple Psych: Patient is not psychotic, no suicidal or hemocidal ideation.  Labs on Admission:  Basic Metabolic Panel:  Recent Labs Lab 12/26/15 0945  NA 140  K 4.1  CL 104  CO2 24  GLUCOSE 111*  BUN 13  CREATININE 0.92  CALCIUM 9.8   Liver Function Tests: No results for input(s): AST, ALT, ALKPHOS, BILITOT, PROT, ALBUMIN in the last 168 hours. No results for input(s): LIPASE, AMYLASE in the last 168 hours. No results for input(s): AMMONIA in the last 168 hours. CBC:  Recent Labs Lab 12/26/15 0945  WBC 15.2*  HGB 16.1  HCT 45.3  MCV 88.6  PLT 198    Cardiac Enzymes: No results for input(s): CKTOTAL, CKMB, CKMBINDEX, TROPONINI in the last 168 hours.  BNP (last 3 results) No results for input(s): BNP in the last 8760 hours.  ProBNP (last 3 results) No results for input(s): PROBNP in the last 8760 hours.  CBG:  Recent Labs Lab 12/26/15 0929 12/26/15 1059  GLUCAP 108* 83    Radiological Exams on Admission: Dg Chest 2 View  12/26/2015  CLINICAL DATA:  Seizure today. EXAM: CHEST  2 VIEW COMPARISON:  None. FINDINGS: The heart size and mediastinal contours are within normal limits. Both lungs are clear. No evidence of pneumothorax or pleural effusion. The visualized skeletal structures are unremarkable. IMPRESSION: Negative.  No active cardiopulmonary disease. Electronically Signed   By: Myles Rosenthal M.D.   On: 12/26/2015 11:03   Ct Head Wo Contrast  12/26/2015  CLINICAL DATA:  Seizure.  Hit head. EXAM: CT HEAD WITHOUT CONTRAST TECHNIQUE: Contiguous axial images were obtained from the base of the skull through the vertex without  intravenous contrast. COMPARISON:  None. FINDINGS: No acute cortical infarct, hemorrhage, or mass lesion ispresent. Ventricles are of normal size. No significant extra-axial fluid collection is present. The paranasal sinuses andmastoid air cells are clear. The osseous skull is intact. IMPRESSION: Normal brain. Electronically Signed   By: Signa Kell M.D.   On: 12/26/2015 12:12   Mr Laqueta Jean ZO Contrast  12/26/2015  CLINICAL DATA:  Patient fell down and hit head. Subsequent seizure. Postictal on arrival, with elevated white count. No prior history of seizures. EXAM: MRI HEAD WITHOUT AND WITH CONTRAST TECHNIQUE: Multiplanar, multiecho pulse sequences of the brain and surrounding structures were obtained without and with intravenous contrast. CONTRAST:  17mL MULTIHANCE GADOBENATE DIMEGLUMINE 529 MG/ML IV SOLN COMPARISON:  CT head earlier today. FINDINGS: No restricted diffusion. No intra-axial shearing injury or mass  lesion. No hydrocephalus. Abnormal extra-axial T2 hyperintense fluid collection, maximal over the RIGHT frontal lobe up to 3 mm thick. 2 mm on the LEFT. Faintly visible as an extra-axial collection on gradient sequence but without significant susceptibility. Difficult to see on FLAIR imaging. No significant T1 shortening. On coronal T2 images is greater on the RIGHT. Abnormal postcontrast enhancement of the dura, not only in the frontal region, but in the middle cranial fossa asymmetric to the RIGHT. No leptomeningeal or parenchymal enhancement. Major dural venous sinuses are patent. No sinus or mastoid disease. Negative orbits. Cervical lymph nodes, incompletely evaluated. Posterior scalp hematoma greatest in the RIGHT paramedian parietal scalp region. IMPRESSION: Abnormal pachymeningeal enhancement associated with slight subdural fluid, greatest in the frontal region on the RIGHT. Occult bacterial or viral meningitis should be excluded with lumbar puncture. Acute subdural hematoma is not favored, given the lack of findings on CT and lack of significant findings on T1, gradient, and T2 images. Acute subdural hygroma could have this appearance in the setting of trauma, but would be unusual to show postcontrast enhancement. Electronically Signed   By: Elsie Stain M.D.   On: 12/26/2015 16:42    Assessment/Plan Principal Problem:   Seizure [facility]) Active Problems:   Pachymeningitis   Leukocytosis   Scalp laceration  Seizure [facility]): Etiology is not clear. CT head is negative for acute intracranial abnormalities. MRI for brain showed meningeal enhancement. Lumbar puncture was performed in ED. I discussed with neurologist, Dr. Laurell Josephs about initial CSF analysis, who though that the RBC in [facility] is likely from LP traumatic injury and no meningitis. No indication for antibiotics.  -Will admit to tele bed per Dr. Laurell Josephs -appreciate Dr. Mickie Bail consultation, with follow-up recommendations  Keppra   bid  Seizure precauations  Ativan  IV Q15 mins prn seizure  No driving x 6 months. Pt is aware  UrineTox screen -f/u CSF culture -frequent neuro check  Tobacco abuse: -Did counseling about importance of quitting smoking -Nicotine patch  Scalp laceration: -consult to wound care  Nausea and vomiting: has resolved. Etiology is not clear. -Check lipase -When necessary Zofran for nausea -IV fluid  Leukocytosis: no signs of infection. Likely due to stress induced to de-margination. -will follow up blood and urine culture  DVT ppx: SCD  Code Status: Full code Family Communication:  Yes, patient's fianc at bed side Disposition Plan: Admit to inpatient   Date of Service 12/27/2015    Lorretta Harp [facility] Pager [phone]  If 7PM-7AM, please contact night-coverage [email or web address] Password [email or web address] 12/27/2015, 3:27 AM

## 2015-12-27 NOTE — Progress Notes (Signed)
Subjective: No further seizures. Tolerating Keppra well. LP negative but traumatic.   Exam: Filed Vitals:   12/27/15 0521 12/27/15 0632  BP: 111/58 119/58  Pulse: 85 79  Temp: 98.4 F (36.9 C) 98.4 F (36.9 C)  Resp: 20 18       Gen: In bed, NAD MS: alert and oriented CN: 2-12 intact Motor: MAEW Sensory: intact to PP and LT   Pertinent Labs: Glucose, CSF        65       Total Protein, CSF        98      RBC Count, CSF        [id number]      WBC, CSF        3 9      Segmented Neutrophils-CSF        TOO FE... TOO FE...      Lymphs, CSF        TOO FE... TOO FE...      Monocyte-Macrophage-Spinal Fluid        TOO FE... TOO FE...      Eosinophils, CSF        TOO FE... TOO FE...      Other Cells, CSF        FEW SE... FEW SE...      Appearance, CSF        CLOUDY CLOUDY      Color, CSF        PINK PINK      Supernatant        CLEAR CLEAR      Tube #        1 4          Prelim CSF culture negative  Current Facility-Administered Medications  Medication Dose Route Frequency Provider Last Rate Last Dose  . 0.9 %  sodium chloride infusion   Intravenous Continuous Lorretta Harp, MD 125 mL/hr at 12/27/15 0859    . acetaminophen (TYLENOL) tablet 650 mg  650 mg Oral Q6H PRN Lorretta Harp, MD       Or  . acetaminophen (TYLENOL) suppository 650 mg  650 mg Rectal Q6H PRN Lorretta Harp, MD      . levETIRAcetam (KEPPRA) 1,000 mg in sodium chloride 0.9 % 100 mL IVPB  1,000 mg Intravenous Q12H Lorretta Harp, MD   1,000 mg at 12/27/15 0452  . LORazepam (ATIVAN) injection 2 mg  2 mg Intravenous Q15 min PRN Lorretta Harp, MD      . naproxen (NAPROSYN) tablet 500 mg  500 mg Oral BID WC Lorretta Harp, MD   500 mg at 12/27/15 0809  . nicotine (NICODERM CQ - dosed in mg/24 hours) patch 14 mg  14 mg Transdermal Daily Lorretta Harp, MD   14 mg at 12/27/15 0900  . nicotine (NICODERM CQ - dosed in mg/24 hr) patch 7 mg  7 mg Transdermal Once [facility], PA-C   7 mg at 12/26/15 1438  . ondansetron (ZOFRAN)  tablet 4 mg  4 mg Oral Q6H PRN Lorretta Harp, MD       Or  . ondansetron [facility]) injection 4 mg  4 mg Intravenous Q6H PRN Lorretta Harp, MD      . sodium chloride flush (NS) 0.9 % injection 3 mL  3 mL Intravenous Q12H Lorretta Harp, MD   3 mL at 12/27/15 0305     Felicie Morn PA-C [facility] [phone]  Seen with Dr. Lavon Paganini. Please see his attestation note for  A/P for any additional work up recommendations.    Impression: New onset seizure in setting of SDH.    Recommendations: 1) Continue Keppra 500 mg BID.  2) Follow up out patient neurology 3) No driving, operating heavy machinery, perform activities at heights, swimming or participation in water activities until release by outpatient physician.  This has been discussed with patient.     12/27/2015, 9:54 AM

## 2015-12-27 NOTE — Progress Notes (Signed)
NURSING PROGRESS NOTE  Jaime Nixon [id number] Admission Data: 12/27/2015 5:12 AM Attending Provider: Lorretta Harp, MD PCP:No PCP Per Patient Code Status: Full  Allergies:  Dexamethasone and Prednisone Past Medical History:   has no past medical history on file. Past Surgical History:   has no past surgical history on file. Social History:   reports that he has been smoking Cigarettes.  He has been smoking about 1.00 pack per day. He does not have any smokeless tobacco history on file. He reports that he uses illicit drugs (Marijuana). He reports that he does not drink alcohol.  Jaime Nixon is a 20 y.o. male patient admitted from ED:   Last Documented Vital Signs: Blood pressure 116/54, pulse 64, temperature 98 F (36.7 C), temperature source Oral, resp. rate 20, height  (1.854 m), weight 79.833 kg (176 lb), SpO2 100 %.  Cardiac Monitoring: Box # 27 in place. Cardiac monitor yields:normal sinus rhythm.  IV Fluids:  IV in place, occlusive dsg intact without redness, IV cath hand left, condition patent and no redness normal saline.   Skin: laceration to posterior head.  Patient/Family orientated to room. Information packet given to patient/family. Admission inpatient armband information verified with patient/family to include name and date of birth and placed on patient arm. Side rails up x 2, fall assessment and education completed with patient/family. Patient/family able to verbalize understanding of risk associated with falls and verbalized understanding to call for assistance before getting out of bed. Call light within reach. Patient/family able to voice and demonstrate understanding of unit orientation instructions.    Will continue to evaluate and treat per MD orders.  Sue Lush RN, BSN

## 2015-12-27 NOTE — Discharge Summary (Signed)
Physician Discharge Summary  Jaime Nixon EAV:[id number] DOB: 1996/03/25 DOA: 12/26/2015  PCP: No PCP Per Patient  Admit date: 12/26/2015 Discharge date: 12/27/2015  Time spent: 40 minutes  Recommendations for Outpatient Follow-up:  1. [facility]. 2. Please scheduled follow-up with neurology as outpatient.   Discharge Diagnoses:  Principal Problem:   Seizure [facility]) Active Problems:   Pachymeningitis   Leukocytosis   Scalp laceration   Discharge Condition: Stable  Diet recommendation: Regular  Filed Weights   12/26/15 0851 12/27/15 0239  Weight: 77.111 kg (170 lb) 79.833 kg (176 lb)    History of present illness:  Jaime Nixon is a 20 y.o. 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  Patient reports that he had nausea and vomiting last evening. He vomited several times. He does not have abdominal pain or diarrhea. His nausea and vomiting has resolved. He states that he had 2 episode of seizure in this morning. At about 7;30 in AM, he suddenly became stiff and fell backwards, striking back of his head on cement. The episode lasted for 1-2 min. Another episode happened at about 10:30 AM. He had small scalp laceration over occipital area. When I saw pt in ED, his Mental status is normal. He does not have unilateral weakness, chest pain, cough, abdominal pain, symptoms of UTI. Denies nausea or vomiting.  In ED, patient was found to have WBC 15.2, temperature normal, mildly bradycardia, electrolytes and renal function okay, negative chest x-ray, negative CT head for acute intracranial abnormalities. MRI showed abnormal pachymeningeal enhancement associated with slight subdural fluid, greatest in the frontal region on the RIGHT. Patient is admitted to inpatient for further eval and treatment. Neurology was consulted, EEG was performed, which is suggestive  of underlying epileptogenic potential per Dr. Laurell Josephs. Lumbar puncture is performed in the ED.   Hospital Course:   Seizure -CT head is negative for acute intracranial abnormalities.  -MRI for brain showed meningeal enhancement. Lumbar puncture was performed in ED.  -Dr Clyde Lundborg with neurologist, Dr. Laurell Josephs about initial CSF analysis, who though that the RBC in CSF is likely from LP traumatic injury and no meningitis. No indication for antibiotics. -Discussed with neuro PA, okay to discharge home today on Keppra. -Needs follow-up with PCP and neurology as outpatient.  Tobacco abuse: -Did counseling about importance of quitting smoking -Nicotine patch  Scalp laceration: -From fall, no evidence of infection.  Nausea and vomiting: has resolved. Etiology is not clear.  Leukocytosis: no signs of infection. Likely due to stress induced to de-margination.   Procedures:  None  Consultations:  Neuro  Discharge Exam: Filed Vitals:   12/27/15 0521 12/27/15 0632  BP: 111/58 119/58  Pulse: 85 79  Temp: 98.4 F (36.9 C) 98.4 F (36.9 C)  Resp: 20 18   General: Alert and awake, oriented x3, not in any acute distress. HEENT: anicteric sclera, pupils reactive to light and accommodation, EOMI CVS: S1-S2 clear, no murmur rubs or gallops Chest: clear to auscultation bilaterally, no wheezing, rales or rhonchi Abdomen: soft nontender, nondistended, normal bowel sounds, no organomegaly Extremities: no cyanosis, clubbing or edema noted bilaterally Neuro: Cranial nerves II-XII intact, no focal neurological deficits  Discharge Instructions    Current Discharge Medication List    START taking these medications   Details  levETIRAcetam (KEPPRA) 500 MG tablet Take 1 tablet (500 mg total) by mouth 2 (two) times  daily. Qty: 60 tablet, Refills: 5      CONTINUE these medications which have NOT CHANGED   Details  ondansetron (ZOFRAN ODT) 4 MG disintegrating tablet Take 1 tablet (4 mg total) by  mouth every 8 (eight) hours as needed for nausea. Qty: 10 tablet, Refills: 0      STOP taking these medications     bacitracin ointment      hydrOXYzine (ATARAX/VISTARIL) 25 MG tablet      naproxen (NAPROSYN) 500 MG tablet      promethazine (PHENERGAN) 25 MG tablet        Allergies  Allergen Reactions  . Dexamethasone Other (See Comments)    "Difficulty breathing, anger"  . Prednisone Other (See Comments)    Irritated   Follow-up Information    Follow up with [facility]. Go on 01/01/2016.   Why:  at 4:40 pm, bring your photo ID, and any insurance cards you may have, Arrive 10 minutes early. You may fill your meds here low cost the day you are discharged from the hospital.   Contact information:   [address] [place] [address] [phone]       The results of significant diagnostics from this hospitalization (including imaging, microbiology, ancillary and laboratory) are listed below for reference.    Significant Diagnostic Studies: Dg Chest 2 View  12/26/2015  CLINICAL DATA:  Seizure today. EXAM: CHEST  2 VIEW COMPARISON:  None. FINDINGS: The heart size and mediastinal contours are within normal limits. Both lungs are clear. No evidence of pneumothorax or pleural effusion. The visualized skeletal structures are unremarkable. IMPRESSION: Negative.  No active cardiopulmonary disease. Electronically Signed   By: Myles Rosenthal M.D.   On: 12/26/2015 11:03   Ct Head Wo Contrast  12/26/2015  CLINICAL DATA:  Seizure.  Hit head. EXAM: CT HEAD WITHOUT CONTRAST TECHNIQUE: Contiguous axial images were obtained from the base of the skull through the vertex without intravenous contrast. COMPARISON:  None. FINDINGS: No acute cortical infarct, hemorrhage, or mass lesion ispresent. Ventricles are of normal size. No significant extra-axial fluid collection is present. The paranasal sinuses andmastoid air cells are clear. The osseous skull  is intact. IMPRESSION: Normal brain. Electronically Signed   By: Signa Kell M.D.   On: 12/26/2015 12:12   Mr Laqueta Jean MV Contrast  12/26/2015  CLINICAL DATA:  Patient fell down and hit head. Subsequent seizure. Postictal on arrival, with elevated white count. No prior history of seizures. EXAM: MRI HEAD WITHOUT AND WITH CONTRAST TECHNIQUE: Multiplanar, multiecho pulse sequences of the brain and surrounding structures were obtained without and with intravenous contrast. CONTRAST:  17mL MULTIHANCE GADOBENATE DIMEGLUMINE 529 MG/ML IV SOLN COMPARISON:  CT head earlier today. FINDINGS: No restricted diffusion. No intra-axial shearing injury or mass lesion. No hydrocephalus. Abnormal extra-axial T2 hyperintense fluid collection, maximal over the RIGHT frontal lobe up to 3 mm thick. 2 mm on the LEFT. Faintly visible as an extra-axial collection on gradient sequence but without significant susceptibility. Difficult to see on FLAIR imaging. No significant T1 shortening. On coronal T2 images is greater on the RIGHT. Abnormal postcontrast enhancement of the dura, not only in the frontal region, but in the middle cranial fossa asymmetric to the RIGHT. No leptomeningeal or parenchymal enhancement. Major dural venous sinuses are patent. No sinus or mastoid disease. Negative orbits. Cervical lymph nodes, incompletely evaluated. Posterior scalp hematoma greatest in the RIGHT paramedian parietal scalp region. IMPRESSION: Abnormal pachymeningeal enhancement associated with slight  subdural fluid, greatest in the frontal region on the RIGHT. Occult bacterial or viral meningitis should be excluded with lumbar puncture. Acute subdural hematoma is not favored, given the lack of findings on CT and lack of significant findings on T1, gradient, and T2 images. Acute subdural hygroma could have this appearance in the setting of trauma, but would be unusual to show postcontrast enhancement. Electronically Signed   By: Elsie Stain M.D.    On: 12/26/2015 16:42    Microbiology: Recent Results (from the past 240 hour(s))  CSF culture     Status: None (Preliminary result)   Collection Time: 12/26/15 10:19 PM  Result Value Ref Range Status   Specimen Description BACK  Final   Special Requests NONE  Final   Gram Stain   Final    CYTOSPIN SMEAR WBC PRESENT, PREDOMINANTLY PMN NO ORGANISMS SEEN    Culture NO GROWTH < 12 HOURS  Final   Report Status PENDING  Incomplete     Labs: Basic Metabolic Panel:  Recent Labs Lab 12/26/15 0945 12/27/15 0550  NA 140 142  K 4.1 4.1  CL 104 106  CO2 24 25  GLUCOSE 111* 104*  BUN 13 9  CREATININE 0.92 0.85  CALCIUM 9.8 9.8   Liver Function Tests:  Recent Labs Lab 12/27/15 0550  AST 17  ALT 8*  ALKPHOS 63  BILITOT 0.5  PROT 6.4*  ALBUMIN 4.0    Recent Labs Lab 12/27/15 0550  LIPASE 20   No results for input(s): AMMONIA in the last 168 hours. CBC:  Recent Labs Lab 12/26/15 0945 12/27/15 0550  WBC 15.2* 10.1  HGB 16.1 14.4  HCT 45.3 43.2  MCV 88.6 90.2  PLT 198 188   Cardiac Enzymes: No results for input(s): CKTOTAL, CKMB, CKMBINDEX, TROPONINI in the last 168 hours. BNP: BNP (last 3 results) No results for input(s): BNP in the last 8760 hours.  ProBNP (last 3 results) No results for input(s): PROBNP in the last 8760 hours.  CBG:  Recent Labs Lab 12/26/15 0929 12/26/15 1059 12/27/15 0810  GLUCAP 108* 83 92       Signed:  Nicolai Labonte A MD.  [facility] 12/27/2015, 12:46 PM

## 2015-12-27 NOTE — Progress Notes (Signed)
Nsg Discharge Note  Admit Date:  12/26/2015 Discharge date: 12/27/2015   KIRAN CARLINE to be D/C'd Home per MD order.  AVS completed.  Copy for chart, and copy for patient signed, and dated. Patient/caregiver able to verbalize understanding.  Discharge Medication:   Medication List    STOP taking these medications        bacitracin ointment     hydrOXYzine 25 MG tablet  Commonly known as:  ATARAX/VISTARIL     naproxen 500 MG tablet  Commonly known as:  NAPROSYN     promethazine 25 MG tablet  Commonly known as:  PHENERGAN      TAKE these medications        levETIRAcetam 500 MG tablet  Commonly known as:  KEPPRA  Take 1 tablet (500 mg total) by mouth 2 (two) times daily.     ondansetron 4 MG disintegrating tablet  Commonly known as:  ZOFRAN ODT  Take 1 tablet (4 mg total) by mouth every 8 (eight) hours as needed for nausea.        Discharge Assessment: Filed Vitals:   12/27/15 0632 12/27/15 1537  BP: 119/58 120/73  Pulse: 79 73  Temp: 98.4 F (36.9 C) 98.3 F (36.8 C)  Resp: 18 20   Skin has a laceration to back of head. Staples were removed prior to discharge.  IV catheter discontinued intact. Site without signs and symptoms of complications - no redness or edema noted at insertion site, patient denies c/o pain - only slight tenderness at site.  Dressing with slight pressure applied.  D/c Instructions-Education: Discharge instructions given to patient/family with verbalized understanding. D/c education completed with patient/family including follow up instructions, medication list, d/c activities limitations if indicated, with other d/c instructions as indicated by MD - patient able to verbalize understanding, all questions fully answered. Patient instructed to return to ED, call 911, or call MD for any changes in condition.  Patient escorted via WC, and D/C home via private auto.  Collie Kittel, Gretta Cool, RN 12/27/2015 6:47 PM

## 2015-12-27 NOTE — Consult Note (Signed)
WOC wound consult note Reason for Consult: Consult requested for posterior head laceration. Wound type: Full thickness Measurement: .1X1X.1cm Wound bed: red and dry Drainage (amount, consistency, odor) no odor or drainage, surrounded by old dried blood Periwound: Painful to touch, surrounded by red partial thickness abrasion to area approx 1X1cm Dressing procedure/placement/frequency: Laceration is well approximated and fairly shallow.  Antibiotic ointment to provide antimicrobial benefits and promote moist healing.  Area can be left open to air.  Discussed plan of care with pt and he verbalized understanding. Please re-consult if further assistance is needed.  Thank-you,  Cammie Mcgee MSN, RN, CWOCN, [place], CNS [phone]

## 2015-12-27 NOTE — Care Management Note (Signed)
Case Management Note  Patient Details  Name: OBRIEN HUSKINS MRN: [id number] Date of Birth: 10-Oct-1996  Subjective/Objective:             Spoke with patient and girlfriend at the bedside. Patient states that he would like to follow up at [facility], appointment made in AVS, Patient received pamphlet from [facility] and [facility]. CM explained to patient that they may use the on site pharmacy to fill prescriptions given to them at discharge. Patient aware that the [facility] pharmacy will not fill narcotics or pain medications prior to the patient being seen by one of their physicians.  Patient aware that they must be seen as a patient prior to the pharmacy filling the prescriptions a second time. Patient instructed to file for medicaid and/ or insurance/ orange card, and he can assisted with this at [facility].        Action/Plan:  No further CM needs at this time.  Expected Discharge Date:                  Expected Discharge Plan:  Home/Self Care  In-House Referral:     Discharge planning Services  CM Consult, [facility]  Post Acute Care Choice:  NA Choice offered to:     DME Arranged:    DME Agency:     HH Arranged:    HH Agency:     Status of Service:  Completed, signed off  Medicare Important Message Given:    Date Medicare IM Given:    Medicare IM give by:    Date Additional Medicare IM Given:    Additional Medicare Important Message give by:     If discussed at Long Length of Stay Meetings, dates discussed:    Additional Comments:  Lawerance Sabal, RN 12/27/2015, 1:51 PM

## 2015-12-28 MED FILL — ?LEVETIRACETAM 500 MG TABLE: 500 | 30 days supply | Qty: 60 | Fill #0

## 2015-12-30 LAB — CSF CULTURE W GRAM STAIN

## 2015-12-30 LAB — CSF CULTURE: CULTURE: NO GROWTH

## 2016-01-01 ENCOUNTER — Ambulatory Visit: Payer: Self-pay | Attending: Internal Medicine | Admitting: Internal Medicine

## 2016-01-01 ENCOUNTER — Encounter: Payer: Self-pay | Admitting: Internal Medicine

## 2016-01-01 VITALS — BP 119/76 | HR 115 | Temp 98.1°F | Resp 15 | Ht 73.0 in | Wt 181.0 lb

## 2016-01-01 DIAGNOSIS — Z131 Encounter for screening for diabetes mellitus: Secondary | ICD-10-CM

## 2016-01-01 DIAGNOSIS — R51 Headache: Secondary | ICD-10-CM | POA: Insufficient documentation

## 2016-01-01 DIAGNOSIS — Z888 Allergy status to other drugs, medicaments and biological substances status: Secondary | ICD-10-CM | POA: Insufficient documentation

## 2016-01-01 DIAGNOSIS — F1290 Cannabis use, unspecified, uncomplicated: Secondary | ICD-10-CM | POA: Insufficient documentation

## 2016-01-01 DIAGNOSIS — R9089 Other abnormal findings on diagnostic imaging of central nervous system: Secondary | ICD-10-CM

## 2016-01-01 DIAGNOSIS — R569 Unspecified convulsions: Secondary | ICD-10-CM

## 2016-01-01 DIAGNOSIS — F1210 Cannabis abuse, uncomplicated: Secondary | ICD-10-CM

## 2016-01-01 DIAGNOSIS — Z79899 Other long term (current) drug therapy: Secondary | ICD-10-CM | POA: Insufficient documentation

## 2016-01-01 DIAGNOSIS — F17210 Nicotine dependence, cigarettes, uncomplicated: Secondary | ICD-10-CM | POA: Insufficient documentation

## 2016-01-01 DIAGNOSIS — S0101XA Laceration without foreign body of scalp, initial encounter: Secondary | ICD-10-CM | POA: Insufficient documentation

## 2016-01-01 DIAGNOSIS — S0101XD Laceration without foreign body of scalp, subsequent encounter: Secondary | ICD-10-CM

## 2016-01-01 DIAGNOSIS — R930 Abnormal findings on diagnostic imaging of skull and head, not elsewhere classified: Secondary | ICD-10-CM

## 2016-01-01 DIAGNOSIS — Z716 Tobacco abuse counseling: Secondary | ICD-10-CM

## 2016-01-01 DIAGNOSIS — Z720 Tobacco use: Secondary | ICD-10-CM

## 2016-01-01 LAB — POCT GLYCOSYLATED HEMOGLOBIN (HGB A1C): HEMOGLOBIN A1C: 5.3

## 2016-01-01 MED ORDER — NICOTINE 21 MG/24HR TD PT24: 21.0000 mg | MEDICATED_PATCH | Freq: Every day | TRANSDERMAL | Status: DC

## 2016-01-01 NOTE — Progress Notes (Signed)
Patient here to establish care He is taking all medications Patient reports smoking 1 ppd cigarettes and smoking marijuana Denies ETOH

## 2016-01-01 NOTE — Progress Notes (Signed)
Jaime Nixon, is a 20 y.o. male  RUE:[id number]  JYN:[id number]  DOB - 25-Dec-1995  CC:  New onset sz     HPI: Jaime Nixon is a 20 y.o. male here today to establish medical care since hospitalization for new onset seizure.   Has some mild headaches since seizure and now finally able to get up and walk more last few days.  Taking zofran for nausea still, but denies vomiting/emesis/visual disturbances.  +1 ppd for years and attest to smoking marijuana daily.  Has new baby on way in abt 2 months.  Wants to and ready to stop smoking.  Will also try to stop smoking marijuana.  Was told at hospital not to drive or use heavy equipment for 6 months.   Patient has No chest pain, No abdominal pain - No Nausea, No new weakness tingling or numbness, No Cough - SOB.  Allergies  Allergen Reactions  . Dexamethasone Other (See Comments)    "Difficulty breathing, anger"  . Prednisone Other (See Comments)    Irritated   No past medical history on file. Current Outpatient Prescriptions on File Prior to Visit  Medication Sig Dispense Refill  . levETIRAcetam (KEPPRA) 500 MG tablet Take 1 tablet (500 mg total) by mouth 2 (two) times daily. 60 tablet 5  . ondansetron (ZOFRAN ODT) 4 MG disintegrating tablet Take 1 tablet (4 mg total) by mouth every 8 (eight) hours as needed for nausea. (Patient not taking: Reported on 05/29/2015) 10 tablet 0   No current facility-administered medications on file prior to visit.    Social History   Social History  . Marital Status: Single    Spouse Name: N/A  . Number of Children: N/A  . Years of Education: N/A   Occupational History  . Not on file.   Social History Main Topics  . Smoking status: Current Every Day Smoker -- 1.00 packs/day    Types: Cigarettes  . Smokeless tobacco: Not on file  . Alcohol Use: No  . Drug Use: Yes    Special: Marijuana     Comment: every other day  . Sexual Activity: Not on file   Other Topics Concern  . Not on file    Social History Narrative   Pt dad and brother both have seizures. Brother's sx didn't come on til early 20s.  Review of Systems: Constitutional: Negative for fever, chills, diaphoresis, activity change, appetite change and fatigue. HENT: Negative for ear pain, nosebleeds, congestion, facial swelling, rhinorrhea, neck pain, neck stiffness and ear discharge.   Mild headache. Ambulating ok.    Eyes: Negative for pain, discharge, redness, itching and visual disturbance. Respiratory: Negative for cough, choking, chest tightness, shortness of breath, wheezing and stridor.  Cardiovascular: Negative for chest pain, palpitations and leg swelling. Gastrointestinal: Negative for abdominal distention.  Mild intermittant nausea, taking zofran, but no emesis. Genitourinary: no dysuria. Musculoskeletal: Negative for back pain, joint swelling, arthralgia and gait problem. Neurological: Negative for dizziness, tremors, seizures, syncope, facial asymmetry, speech difficulty, weakness, light-headedness, numbness and headaches.   No further seizures since hospitalization. Hematological: Negative for adenopathy. Does not bruise/bleed easily. Psychiatric/Behavioral: Negative for hallucinations, behavioral problems, confusion, dysphoric mood, decreased concentration and agitation.    Objective:  119/76  Hr 115, rr 15, 98.1  Physical Exam: Constitutional: Patient appears well-developed and well-nourished. No distress. Pleasant, aaox3, gait intact. HENT: Normocephalic, atraumatic, External right and left ear normal. Oropharynx is clear and moist.  Eyes: Conjunctivae and EOM are normal. PERRL, no scleral icterus. Neck:  Normal ROM. Neck supple. No JVD. No tracheal deviation. No thyromegaly. CVS: RRR, S1/S2 +, no murmurs, no gallops, no carotid bruit.  Pulmonary: Effort and breath sounds normal, no stridor, rhonchi, wheezes, rales.   No le edema. Abdominal: Soft. BS +, no distension, tenderness, rebound or  guarding.  Musculoskeletal: Normal range of motion. No edema and no tenderness.  Lymphadenopathy: No lymphadenopathy noted, cervical, inguinal or axillary Neuro: Alert.  muscle tone coordination.  Skin: Skin is warm and dry. No rash noted. Not diaphoretic. No erythema. No pallor. Psychiatric: Normal mood and affect. Behavior, judgment, thought content normal.    Lab Results  Component Value Date   WBC 10.1 12/27/2015   HGB 14.4 12/27/2015   HCT 43.2 12/27/2015   MCV 90.2 12/27/2015   PLT 188 12/27/2015   Lab Results  Component Value Date   CREATININE 0.85 12/27/2015   BUN 9 12/27/2015   NA 142 12/27/2015   K 4.1 12/27/2015   CL 106 12/27/2015   CO2 25 12/27/2015    No results found for: HGBA1C Lipid Panel  No results found for: CHOL, TRIG, HDL, CHOLHDL, VLDL, LDLCALC     Assessment and plan:   1. Diabetes mellitus screening - HgB A1c 5.3  2. Seizure (HCC) - continue keppra 500bid, indefinately. - encouraged to stop taking marijuana, - referral for neurology placed for outpt fu, may need to get orange card first.  3. Scalp laceration, subsequent encounter - resolved.   4. Abnormal finding on MRI of brain - lumbar puncture labs ntd.    5. Marijuana use - recd to stop  6. tob abuse/dependence,  - tob cessation given, wants to stop. Has baby on way in 2months.   - trial nicoderm patch  7. Social - set up appt w/ financial services   Fu w/ me in 3months.  The patient was given clear instructions to go to ER or return to medical center if symptoms don't improve, worsen or new problems develop. The patient verbalized understanding. The patient was told to call to get lab results if they haven't heard anything in the next week.      Pete Glatter, MD, MBA/MHA [facility] [place], [place] [phone]   01/01/2016, 4:39 PM

## 2016-01-01 NOTE — Patient Instructions (Addendum)
- fu w/ outpatient neurology - schedule? - set up appt w/ financial office, orange card, etc. - fu w/ me in 3 - 4 months.  It was a pleasures meeting you.  Epilepsy Epilepsy is a disorder in which a person has repeated seizures over time. A seizure is a release of abnormal electrical activity in the brain. Seizures can cause a change in attention, behavior, or the ability to remain awake and alert (altered mental status). Seizures often involve uncontrollable shaking (convulsions).  Most people with epilepsy lead normal lives. However, people with epilepsy are at an increased risk of falls, accidents, and injuries. Therefore, it is important to begin treatment right away. CAUSES  Epilepsy has many possible causes. Anything that disturbs the normal pattern of brain cell activity can lead to seizures. This may include:   Head injury.  Birth trauma.  High fever as a child.  Stroke.  Bleeding into or around the brain.  Certain drugs.  Prolonged low oxygen, such as what occurs after CPR efforts.  Abnormal brain development.  Certain illnesses, such as meningitis, encephalitis (brain infection), malaria, and other infections.  An imbalance of nerve signaling chemicals (neurotransmitters).  SIGNS AND SYMPTOMS  The symptoms of a seizure can vary greatly from one person to another. Right before a seizure, you may have a warning (aura) that a seizure is about to occur. An aura may include the following symptoms:  Fear or anxiety.  Nausea.  Feeling like the room is spinning (vertigo).  Vision changes, such as seeing flashing lights or spots. Common symptoms during a seizure include:  Abnormal sensations, such as an abnormal smell or a bitter taste in the mouth.   Sudden, general body stiffness.   Convulsions that involve rhythmic jerking of the face, arm, or leg on one or both sides.   Sudden change in consciousness.   Appearing to be awake but not responding.    Appearing to be asleep but cannot be awakened.   Grimacing, chewing, lip smacking, drooling, tongue biting, or loss of bowel or bladder control. After a seizure, you may feel sleepy for a while. DIAGNOSIS  Your health care provider will ask about your symptoms and take a medical history. Descriptions from any witnesses to your seizures will be very helpful in the diagnosis. A physical exam, including a detailed neurological exam, is necessary. Various tests may be done, such as:   An electroencephalogram (EEG). This is a painless test of your brain waves. In this test, a diagram is created of your brain waves. These diagrams can be interpreted by a specialist.  An MRI of the brain.   A CT scan of the brain.   A spinal tap (lumbar puncture, LP).  Blood tests to check for signs of infection or abnormal blood chemistry. TREATMENT  There is no cure for epilepsy, but it is generally treatable. Once epilepsy is diagnosed, it is important to begin treatment as soon as possible. For most people with epilepsy, seizures can be controlled with medicines. The following may also be used:  A pacemaker for the brain (vagus nerve stimulator) can be used for people with seizures that are not well controlled by medicine.  Surgery on the brain. For some people, epilepsy eventually goes away. HOME CARE INSTRUCTIONS   Follow your health care provider's recommendations on driving and safety in normal activities.  Get enough rest. Lack of sleep can cause seizures.  Only take over-the-counter or prescription medicines as directed by your health care provider.  Take any prescribed medicine exactly as directed.  Avoid any known triggers of your seizures.  Keep a seizure diary. Record what you recall about any seizure, especially any possible trigger.   Make sure the people you live and work with know that you are prone to seizures. They should receive instructions on how to help you. In general, a  witness to a seizure should:   Cushion your head and body.   Turn you on your side.   Avoid unnecessarily restraining you.   Not place anything inside your mouth.   Call for emergency medical help if there is any question about what has occurred.   Follow up with your health care provider as directed. You may need regular blood tests to monitor the levels of your medicine.  SEEK MEDICAL CARE IF:   You develop signs of infection or other illness. This might increase the risk of a seizure.   You seem to be having more frequent seizures.   Your seizure pattern is changing.  SEEK IMMEDIATE MEDICAL CARE IF:   You have a seizure that does not stop after a few moments.   You have a seizure that causes any difficulty in breathing.   You have a seizure that results in a very severe headache.   You have a seizure that leaves you with the inability to speak or use a part of your body.    This information is not intended to replace advice given to you by your health care provider. Make sure you discuss any questions you have with your health care provider.   Document Released: 10/14/2005 Document Revised: 08/04/2013 Document Reviewed: 05/26/2013 Elsevier Interactive Patient Education 2016 [employer].   Smoking Cessation, Tips for Success If you are ready to quit smoking, congratulations! You have chosen to help yourself be healthier. Cigarettes bring nicotine, tar, carbon monoxide, and other irritants into your body. Your lungs, heart, and blood vessels will be able to work better without these poisons. There are many different ways to quit smoking. Nicotine gum, nicotine patches, a nicotine inhaler, or nicotine nasal spray can help with physical craving. Hypnosis, support groups, and medicines help break the habit of smoking. WHAT THINGS CAN I DO TO MAKE QUITTING EASIER?  Here are some tips to help you quit for good:  Pick a date when you will quit smoking completely.  Tell all of your friends and family about your plan to quit on that date.  Do not try to slowly cut down on the number of cigarettes you are smoking. Pick a quit date and quit smoking completely starting on that day.  Throw away all cigarettes.   Clean and remove all ashtrays from your home, work, and car.  On a card, write down your reasons for quitting. Carry the card with you and read it when you get the urge to smoke.  Cleanse your body of nicotine. Drink enough water and fluids to keep your urine clear or pale yellow. Do this after quitting to flush the nicotine from your body.  Learn to predict your moods. Do not let a bad situation be your excuse to have a cigarette. Some situations in your life might tempt you into wanting a cigarette.  Never have "just one" cigarette. It leads to wanting another and another. Remind yourself of your decision to quit.  Change habits associated with smoking. If you smoked while driving or when feeling stressed, try other activities to replace smoking. Stand up when drinking your  coffee. Brush your teeth after eating. Sit in a different chair when you read the paper. Avoid alcohol while trying to quit, and try to drink fewer caffeinated beverages. Alcohol and caffeine may urge you to smoke.  Avoid foods and drinks that can trigger a desire to smoke, such as sugary or spicy foods and alcohol.  Ask people who smoke not to smoke around you.  Have something planned to do right after eating or having a cup of coffee. For example, plan to take a walk or exercise.  Try a relaxation exercise to calm you down and decrease your stress. Remember, you may be tense and nervous for the first 2 weeks after you quit, but this will pass.  Find new activities to keep your hands busy. Play with a pen, coin, or rubber band. Doodle or draw things on paper.  Brush your teeth right after eating. This will help cut down on the craving for the taste of tobacco after meals.  You can also try mouthwash.   Use oral substitutes in place of cigarettes. Try using lemon drops, carrots, cinnamon sticks, or chewing gum. Keep them handy so they are available when you have the urge to smoke.  When you have the urge to smoke, try deep breathing.  Designate your home as a nonsmoking area.  If you are a heavy smoker, ask your health care provider about a prescription for nicotine chewing gum. It can ease your withdrawal from nicotine.  Reward yourself. Set aside the cigarette money you save and buy yourself something nice.  Look for support from others. Join a support group or smoking cessation program. Ask someone at home or at work to help you with your plan to quit smoking.  Always ask yourself, "Do I need this cigarette or is this just a reflex?" Tell yourself, "Today, I choose not to smoke," or "I do not want to smoke." You are reminding yourself of your decision to quit.  Do not replace cigarette smoking with electronic cigarettes (commonly called e-cigarettes). The safety of e-cigarettes is unknown, and some may contain harmful chemicals.  If you relapse, do not give up! Plan ahead and think about what you will do the next time you get the urge to smoke. HOW WILL I FEEL WHEN I QUIT SMOKING? You may have symptoms of withdrawal because your body is used to nicotine (the addictive substance in cigarettes). You may crave cigarettes, be irritable, feel very hungry, cough often, get headaches, or have difficulty concentrating. The withdrawal symptoms are only temporary. They are strongest when you first quit but will go away within 10-14 days. When withdrawal symptoms occur, stay in control. Think about your reasons for quitting. Remind yourself that these are signs that your body is healing and getting used to being without cigarettes. Remember that withdrawal symptoms are easier to treat than the major diseases that smoking can cause.  Even after the withdrawal is over,  expect periodic urges to smoke. However, these cravings are generally short lived and will go away whether you smoke or not. Do not smoke! WHAT RESOURCES ARE AVAILABLE TO HELP ME QUIT SMOKING? Your health care provider can direct you to community resources or hospitals for support, which may include:  Group support.  Education.  Hypnosis.  Therapy.   This information is not intended to replace advice given to you by your health care provider. Make sure you discuss any questions you have with your health care provider.   Document Released: 07/12/2004 Document  Revised: 11/04/2014 Document Reviewed: 04/01/2013 Elsevier Interactive Patient Education [employer] Elsevier Inc.

## 2016-01-08 ENCOUNTER — Emergency Department (HOSPITAL_COMMUNITY)
Admission: EM | Admit: 2016-01-08 | Discharge: 2016-01-08 | Disposition: A | Discharge status: Home / Self Care | Payer: Self-pay

## 2016-01-08 NOTE — ED Notes (Signed)
Attempted to call for triage x 3, will take out of system at this time.

## 2016-01-08 NOTE — ED Notes (Signed)
Called for patient in the lobby once, no answer.

## 2016-01-16 ENCOUNTER — Ambulatory Visit: Payer: Self-pay | Admitting: Neurology

## 2016-01-16 DIAGNOSIS — Z029 Encounter for administrative examinations, unspecified: Secondary | ICD-10-CM

## 2016-01-30 MED FILL — ?LEVETIRACETAM 500 MG TABLE: 500 | 30 days supply | Qty: 60 | Fill #1

## 2016-02-05 ENCOUNTER — Ambulatory Visit: Payer: Self-pay | Admitting: Internal Medicine

## 2016-02-12 ENCOUNTER — Telehealth: Payer: Self-pay | Admitting: General Practice

## 2016-02-12 NOTE — Telephone Encounter (Signed)
Pt. Called stating he needs a letter from his PCP stating that he can not work for 6 months per his PCP. Pt. Stated his PCP told him he could not work and pt. Needs the letter for his attorney. Please f/u  °

## 2016-02-20 NOTE — Telephone Encounter (Signed)
Pt. Called stating he needs a letter from his PCP stating that he can not work for 6 months per his PCP. Pt. Stated his PCP told him he could not work and pt. Needs the letter for his attorney. Please f/u

## 2016-02-21 NOTE — Telephone Encounter (Signed)
MA unable to leave VM due to contact releasing after several rings.  !!!Patient may work. Patient was advised not to DRIVE for 6 months. No letter is needed at this time!!!

## 2016-04-18 ENCOUNTER — Emergency Department (HOSPITAL_COMMUNITY): Payer: Self-pay

## 2016-04-18 ENCOUNTER — Other Ambulatory Visit: Payer: Self-pay

## 2016-04-18 ENCOUNTER — Emergency Department (HOSPITAL_COMMUNITY)
Admission: EM | Admit: 2016-04-18 | Discharge: 2016-04-18 | Disposition: A | Discharge status: Home / Self Care | Payer: Self-pay | Attending: Emergency Medicine | Admitting: Emergency Medicine

## 2016-04-18 ENCOUNTER — Encounter (HOSPITAL_COMMUNITY): Payer: Self-pay

## 2016-04-18 DIAGNOSIS — Z79899 Other long term (current) drug therapy: Secondary | ICD-10-CM | POA: Insufficient documentation

## 2016-04-18 DIAGNOSIS — F17210 Nicotine dependence, cigarettes, uncomplicated: Secondary | ICD-10-CM | POA: Insufficient documentation

## 2016-04-18 DIAGNOSIS — G40909 Epilepsy, unspecified, not intractable, without status epilepticus: Secondary | ICD-10-CM | POA: Insufficient documentation

## 2016-04-18 DIAGNOSIS — R569 Unspecified convulsions: Secondary | ICD-10-CM

## 2016-04-18 HISTORY — DX: Unspecified convulsions: R56.9

## 2016-04-18 LAB — I-STAT CHEM 8, ED
BUN: 12 mg/dL (ref 6–20)
CALCIUM ION: 1.18 mmol/L (ref 1.12–1.23)
Chloride: 104 mmol/L (ref 101–111)
Creatinine, Ser: 0.8 mg/dL (ref 0.61–1.24)
Glucose, Bld: 98 mg/dL (ref 65–99)
HCT: 46 % (ref 39.0–52.0)
HEMOGLOBIN: 15.6 g/dL (ref 13.0–17.0)
Potassium: 3.5 mmol/L (ref 3.5–5.1)
SODIUM: 140 mmol/L (ref 135–145)
TCO2: 23 mmol/L (ref 0–100)

## 2016-04-18 LAB — CBG MONITORING, ED: GLUCOSE-CAPILLARY: 91 mg/dL (ref 65–99)

## 2016-04-18 MED ORDER — SODIUM CHLORIDE 0.9 % IV SOLN
1000.0000 mg | Freq: Once | INTRAVENOUS | Status: AC
  Administered 2016-04-18: 1000 mg via INTRAVENOUS
  Filled 2016-04-18: qty 10

## 2016-04-18 MED ORDER — LEVETIRACETAM 500 MG PO TABS: 500.0000 mg | ORAL_TABLET | Freq: Two times a day (BID) | ORAL | Status: DC

## 2016-04-18 MED ORDER — ACETAMINOPHEN 500 MG PO TABS
1000.0000 mg | ORAL_TABLET | Freq: Once | ORAL | Status: AC
  Administered 2016-04-18: 1000 mg via ORAL
  Filled 2016-04-18: qty 2

## 2016-04-18 NOTE — Discharge Instructions (Signed)

## 2016-04-18 NOTE — ED Notes (Signed)
Pt. Coming from home via [facility] for seizure. Pt. Witnessed full body seizure. Pt. Hx of seizure 12/26/2015 with abnormal MRI results. Pt. Has not been taking his kepra for his seizures. Pt. Found by EMS diaphoretic, projectile vomiting, and c/o abd. Pain. Pt. Reports pain to back of his head, but EMS denies seeing any trauma. Pt. Given 8 mg zofran IV en route. Pt. Still lethargic at this time. Pt. Alert to self and place.

## 2016-04-18 NOTE — ED Provider Notes (Signed)
CSN: [id number]     Arrival date & time 04/18/16  1746 History   First MD Initiated Contact with Patient 04/18/16 1806     Chief Complaint  Patient presents with  . Seizures     (Consider location/radiation/quality/duration/timing/severity/associated sxs/prior Treatment) HPI Comments: Patient presents to the emergency department with chief complaint of seizures.  Patient was just recently diagnosed with seizures, with EEG concerning for epileptogenic source of seizure. Patient is supposed to take Keppra 500 mg twice a day, however he has not been doing this. Patient is accompanied by a family member today, who states that the patient began to have generalized tonic-clonic seizure, and fell to the ground striking his head. She reports that he also bit his tongue. He had one episode of vomiting following the seizure. Patient states that he now has mild headache, but otherwise feels fine. He is back to his baseline.  The history is provided by the patient. No language interpreter was used.    Past Medical History  Diagnosis Date  . Seizures (HCC)    History reviewed. No pertinent past surgical history. Family History  Problem Relation Age of Onset  . Seizures Father   . Seizures Brother    Social History  Substance Use Topics  . Smoking status: Current Every Day Smoker -- 1.00 packs/day    Types: Cigarettes  . Smokeless tobacco: None  . Alcohol Use: No    Review of Systems  Constitutional: Negative for fever and chills.  Respiratory: Negative for shortness of breath.   Cardiovascular: Negative for chest pain.  Gastrointestinal: Negative for nausea, vomiting, diarrhea and constipation.  Genitourinary: Negative for dysuria.  Neurological: Positive for seizures.  All other systems reviewed and are negative.     Allergies  Dexamethasone and Prednisone  Home Medications   Prior to Admission medications   Medication Sig Start Date End Date Taking? Authorizing Provider   levETIRAcetam (KEPPRA) 500 MG tablet Take 1 tablet (500 mg total) by mouth 2 (two) times daily. 12/27/15  Yes Clydia LlanoMutaz Elmahi, MD  nicotine (NICODERM CQ) 21 mg/24hr patch Place 1 patch (21 mg total) onto the skin daily. Patient not taking: Reported on 04/18/2016 01/01/16   Pete Glatterawn T Langeland, MD  ondansetron (ZOFRAN ODT) 4 MG disintegrating tablet Take 1 tablet (4 mg total) by mouth every 8 (eight) hours as needed for nausea. Patient not taking: Reported on 04/18/2016 12/05/14   Eber HongBrian Miller, MD   BP 111/68 mmHg  Pulse 84  Temp(Src) 97.3 F (36.3 C) (Oral)  Resp 16  Ht 5\' 6"  (1.676 m)  Wt 82.101 kg  BMI 29.23 kg/m2  SpO2 100% Physical Exam  Constitutional: He is oriented to person, place, and time. He appears well-developed and well-nourished.  HENT:  Head: Normocephalic and atraumatic.  Mild anterior tongue injury, no laceration  Eyes: Conjunctivae and EOM are normal. Pupils are equal, round, and reactive to light. Right eye exhibits no discharge. Left eye exhibits no discharge. No scleral icterus.  Neck: Normal range of motion. Neck supple. No JVD present.  Cardiovascular: Normal rate, regular rhythm and normal heart sounds.  Exam reveals no gallop and no friction rub.   No murmur heard. Pulmonary/Chest: Effort normal and breath sounds normal. No respiratory distress. He has no wheezes. He has no rales. He exhibits no tenderness.  Abdominal: Soft. He exhibits no distension and no mass. There is no tenderness. There is no rebound and no guarding.  Musculoskeletal: Normal range of motion. He exhibits no edema or tenderness.  Neurological: He is alert and oriented to person, place, and time.  CN 3-12 intact Speech is clear Movements are goal oriented Sensation and strength intact  Skin: Skin is warm and dry.  Psychiatric: He has a normal mood and affect. His behavior is normal. Judgment and thought content normal.  Nursing note and vitals reviewed.   ED Course  Procedures (including  critical care time) Results for orders placed or performed during the hospital encounter of 04/18/16  CBG monitoring, ED  Result Value Ref Range   Glucose-Capillary 91 65 - 99 mg/dL  I-stat chem 8, ed  Result Value Ref Range   Sodium 140 135 - 145 mmol/L   Potassium 3.5 3.5 - 5.1 mmol/L   Chloride 104 101 - 111 mmol/L   BUN 12 6 - 20 mg/dL   Creatinine, Ser 6.960.80 0.61 - 1.24 mg/dL   Glucose, Bld 98 65 - 99 mg/dL   Calcium, Ion 2.951.18 2.841.12 - 1.23 mmol/L   TCO2 23 0 - 100 mmol/L   Hemoglobin 15.6 13.0 - 17.0 g/dL   HCT 13.246.0 44.039.0 - 10.252.0 %   Ct Head Wo Contrast  04/18/2016  CLINICAL DATA:  DIAGNOSED WITH A SEIZURE DISORDER 3 MOS. AGO, PT. IS TAKING MEDICATION BUT HAD A SEIZURE TODAY IN WHICH HE FELL AND HIT THE BACK OF HIS HEAD, LW EXAM: CT HEAD WITHOUT CONTRAST TECHNIQUE: Contiguous axial images were obtained from the base of the skull through the vertex without intravenous contrast. COMPARISON:  MR 12/18/2015 and previous FINDINGS: Brain: No evidence of acute infarction, hemorrhage, extra-axial collection, ventriculomegaly, or mass effect. Vascular: No hyperdense vessel or unexpected calcification. Skull: Negative for fracture or focal lesion. Sinuses/Orbits: No acute findings. Other: None. IMPRESSION: 1. Negative.  No bleed or other acute intracranial process. Electronically Signed   By: Corlis Leak  Hassell M.D.   On: 04/18/2016 18:57    I have personally reviewed and evaluated these images and lab results as part of my medical decision-making.  ED ECG REPORT  I personally interpreted this EKG   Date: 04/18/2016   Rate: 86  Rhythm: normal sinus rhythm  QRS Axis: normal  Intervals: normal  ST/T Wave abnormalities: normal  Conduction Disutrbances:none  Narrative Interpretation:   Old EKG Reviewed: none available    MDM   Final diagnoses:  Seizure (HCC)   Patient with recent seizure diagnosis, has not been compliant in taking his Keppra. Larey SeatFell and hit his head during a seizure today. Will  check imaging given fall and head injury. Suspect noncompliance.  Anticipate discharge to home.     Roxy HorsemanRobert Arihant Pennings, PA-C 04/18/16 2010  Azalia BilisKevin Campos, MD 04/19/16 [phone]

## 2016-05-15 MED FILL — levETIRAcetam 500 MG TABS: 500 | 30 days supply | Qty: 60 | Fill #2

## 2016-05-23 ENCOUNTER — Emergency Department (HOSPITAL_COMMUNITY)
Admission: EM | Admit: 2016-05-23 | Discharge: 2016-05-23 | Disposition: A | Discharge status: Home / Self Care | Payer: Self-pay | Attending: Emergency Medicine | Admitting: Emergency Medicine

## 2016-05-23 DIAGNOSIS — R112 Nausea with vomiting, unspecified: Secondary | ICD-10-CM

## 2016-05-23 DIAGNOSIS — F17210 Nicotine dependence, cigarettes, uncomplicated: Secondary | ICD-10-CM | POA: Insufficient documentation

## 2016-05-23 DIAGNOSIS — Z79899 Other long term (current) drug therapy: Secondary | ICD-10-CM | POA: Insufficient documentation

## 2016-05-23 DIAGNOSIS — K5289 Other specified noninfective gastroenteritis and colitis: Secondary | ICD-10-CM | POA: Insufficient documentation

## 2016-05-23 DIAGNOSIS — K529 Noninfective gastroenteritis and colitis, unspecified: Secondary | ICD-10-CM

## 2016-05-23 DIAGNOSIS — F1290 Cannabis use, unspecified, uncomplicated: Secondary | ICD-10-CM | POA: Insufficient documentation

## 2016-05-23 LAB — URINALYSIS, ROUTINE W REFLEX MICROSCOPIC
Bilirubin Urine: NEGATIVE
GLUCOSE, UA: NEGATIVE mg/dL
Hgb urine dipstick: NEGATIVE
Ketones, ur: NEGATIVE mg/dL
LEUKOCYTES UA: NEGATIVE
Nitrite: NEGATIVE
PROTEIN: 30 mg/dL — AB
SPECIFIC GRAVITY, URINE: 1.017 (ref 1.005–1.030)
pH: 8.5 — ABNORMAL HIGH (ref 5.0–8.0)

## 2016-05-23 LAB — RAPID URINE DRUG SCREEN, HOSP PERFORMED
AMPHETAMINES: NOT DETECTED
BENZODIAZEPINES: POSITIVE — AB
Barbiturates: NOT DETECTED
Cocaine: NOT DETECTED
OPIATES: NOT DETECTED
Tetrahydrocannabinol: POSITIVE — AB

## 2016-05-23 LAB — COMPREHENSIVE METABOLIC PANEL
ALK PHOS: 65 U/L (ref 38–126)
ALT: 17 U/L (ref 17–63)
ANION GAP: 12 (ref 5–15)
AST: 33 U/L (ref 15–41)
Albumin: 4.4 g/dL (ref 3.5–5.0)
BILIRUBIN TOTAL: 0.8 mg/dL (ref 0.3–1.2)
BUN: 12 mg/dL (ref 6–20)
CO2: 23 mmol/L (ref 22–32)
Calcium: 9.9 mg/dL (ref 8.9–10.3)
Chloride: 104 mmol/L (ref 101–111)
Creatinine, Ser: 0.79 mg/dL (ref 0.61–1.24)
GFR calc non Af Amer: >60 mL/min (ref >60)
Glucose, Bld: 121 mg/dL — ABNORMAL HIGH (ref 65–99)
Potassium: 3.6 mmol/L (ref 3.5–5.1)
SODIUM: 139 mmol/L (ref 135–145)
TOTAL PROTEIN: 7.1 g/dL (ref 6.5–8.1)

## 2016-05-23 LAB — CBC WITH DIFFERENTIAL/PLATELET
BASOS ABS: 0 10*3/uL (ref 0.0–0.1)
BASOS PCT: 0 %
Eosinophils Absolute: 0.1 10*3/uL (ref 0.0–0.7)
Eosinophils Relative: 0 %
HEMATOCRIT: 43.3 % (ref 39.0–52.0)
HEMOGLOBIN: 15.4 g/dL (ref 13.0–17.0)
Lymphocytes Relative: 11 %
Lymphs Abs: 2 10*3/uL (ref 0.7–4.0)
MCH: 31 pg (ref 26.0–34.0)
MCHC: 35.6 g/dL (ref 30.0–36.0)
MCV: 87.3 fL (ref 78.0–100.0)
Monocytes Absolute: 1 10*3/uL (ref 0.1–1.0)
Monocytes Relative: 5 %
NEUTROS ABS: 15.8 10*3/uL — AB (ref 1.7–7.7)
NEUTROS PCT: 84 %
Platelets: 229 10*3/uL (ref 150–400)
RBC: 4.96 MIL/uL (ref 4.22–5.81)
RDW: 13.4 % (ref 11.5–15.5)
WBC: 18.9 10*3/uL — AB (ref 4.0–10.5)

## 2016-05-23 LAB — URINE MICROSCOPIC-ADD ON: Bacteria, UA: NONE SEEN

## 2016-05-23 LAB — LIPASE, BLOOD: Lipase: 19 U/L (ref 11–51)

## 2016-05-23 LAB — ETHANOL

## 2016-05-23 MED ORDER — PROMETHAZINE HCL 25 MG/ML IJ SOLN
12.5000 mg | Freq: Once | INTRAMUSCULAR | Status: AC
  Administered 2016-05-23: 12.5 mg via INTRAMUSCULAR

## 2016-05-23 MED ORDER — ONDANSETRON HCL 4 MG/2ML IJ SOLN
4.0000 mg | Freq: Once | INTRAMUSCULAR | Status: AC
  Administered 2016-05-23: 4 mg via INTRAVENOUS
  Filled 2016-05-23: qty 2

## 2016-05-23 MED ORDER — PROMETHAZINE HCL 25 MG PO TABS: 25.0000 mg | ORAL_TABLET | Freq: Four times a day (QID) | ORAL | 0 refills | Status: DC | PRN

## 2016-05-23 MED ORDER — ONDANSETRON 4 MG PO TBDP
4.0000 mg | ORAL_TABLET | Freq: Once | ORAL | Status: AC
  Administered 2016-05-23: 4 mg via ORAL
  Filled 2016-05-23: qty 1

## 2016-05-23 MED ORDER — SODIUM CHLORIDE 0.9 % IV BOLUS (SEPSIS)
1000.0000 mL | Freq: Once | INTRAVENOUS | Status: AC
  Administered 2016-05-23: 1000 mL via INTRAVENOUS

## 2016-05-23 MED ORDER — PROMETHAZINE HCL 25 MG/ML IJ SOLN
12.5000 mg | Freq: Once | INTRAMUSCULAR | Status: DC
  Filled 2016-05-23: qty 1

## 2016-05-23 MED ORDER — PROMETHAZINE HCL 25 MG/ML IJ SOLN
12.5000 mg | Freq: Once | INTRAMUSCULAR | Status: AC
  Administered 2016-05-23: 12.5 mg via INTRAVENOUS
  Filled 2016-05-23: qty 1

## 2016-05-23 NOTE — ED Provider Notes (Signed)
[facility]-EMERGENCY DEPT Provider Note   CSN: [id number] Arrival date & time: 05/23/16  4403  First Provider Contact:  None       History   Chief Complaint Chief Complaint  Patient presents with  . Emesis    HPI Jaime Nixon is a 20 y.o. male.  The patient is a 20 year old male with past medical history of seizures. He presents for evaluation of persistent nausea and vomiting that started in the night. He reports drinking several alcoholic beverages containing "cherry vodka". He also reports to frequent marijuana use and did smoke marijuana last night. He denies any diarrhea. He denies any bloody stool or vomit. He denies any fevers or chills. He denies any ill contacts.   The history is provided by the patient.  Emesis   This is a new problem. The current episode started 6 to 12 hours ago. The problem occurs continuously. The problem has not changed since onset.The emesis has an appearance of stomach contents. There has been no fever. Pertinent negatives include no abdominal pain, no chills, no diarrhea and no fever.    Past Medical History:  Diagnosis Date  . Seizures [facility])     Patient Active Problem List   Diagnosis Date Noted  . Leukocytosis 12/27/2015  . Scalp laceration 12/27/2015  . Abnormal finding on MRI of brain   . Seizure (HCC)   . Pachymeningitis     No past surgical history on file.     Home Medications    Prior to Admission medications   Medication Sig Start Date End Date Taking? Authorizing Provider  levETIRAcetam (KEPPRA) 500 MG tablet Take 1 tablet (500 mg total) by mouth 2 (two) times daily. 04/18/16   Roxy Horseman, PA-C  nicotine (NICODERM CQ) 21 mg/24hr patch Place 1 patch (21 mg total) onto the skin daily. Patient not taking: Reported on 04/18/2016 01/01/16   Pete Glatter, MD  ondansetron (ZOFRAN ODT) 4 MG disintegrating tablet Take 1 tablet (4 mg total) by mouth every 8 (eight) hours as needed for nausea. Patient not taking: Reported on  04/18/2016 12/05/14   Eber Hong, MD    Family History Family History  Problem Relation Age of Onset  . Seizures Father   . Seizures Brother     Social History Social History  Substance Use Topics  . Smoking status: Current Every Day Smoker    Packs/day: 1.00    Types: Cigarettes  . Smokeless tobacco: Not on file  . Alcohol use No     Allergies   Dexamethasone and Prednisone   Review of Systems Review of Systems  Constitutional: Negative for chills and fever.  Gastrointestinal: Positive for vomiting. Negative for abdominal pain and diarrhea.  All other systems reviewed and are negative.    Physical Exam Updated Vital Signs BP 131/62 (BP Location: Right Arm)   Pulse (!) 51   Temp 97.7 F (36.5 C) (Oral)   Resp 18   SpO2 100%   Physical Exam  Constitutional: He is oriented to person, place, and time. He appears well-developed and well-nourished. No distress.  HENT:  Head: Normocephalic and atraumatic.  Mucous membranes are somewhat dry  Neck: Normal range of motion. Neck supple.  Cardiovascular: Normal rate and regular rhythm.  Exam reveals no friction rub.   No murmur heard. Pulmonary/Chest: Effort normal and breath sounds normal. No respiratory distress. He has no wheezes. He has no rales.  Abdominal: Soft. Bowel sounds are normal. He exhibits no distension. There is no tenderness.  Musculoskeletal: Normal range of motion. He exhibits no edema.  Neurological: He is alert and oriented to person, place, and time. Coordination normal.  Skin: Skin is warm and dry. He is not diaphoretic.  Nursing note and vitals reviewed.    ED Treatments / Results  Labs (all labs ordered are listed, but only abnormal results are displayed) Labs Reviewed  COMPREHENSIVE METABOLIC PANEL  CBC WITH DIFFERENTIAL/PLATELET  LIPASE, BLOOD  ETHANOL  URINALYSIS, ROUTINE W REFLEX MICROSCOPIC (NOT AT [facility])  URINE RAPID DRUG SCREEN, HOSP PERFORMED    EKG  EKG  Interpretation None       Radiology No results found.  Procedures Procedures (including critical care time)  Medications Ordered in ED Medications  sodium chloride 0.9 % bolus 1,000 mL (not administered)  ondansetron (ZOFRAN) injection 4 mg (not administered)  ondansetron (ZOFRAN-ODT) disintegrating tablet 4 mg (4 mg Oral Given 05/23/16 2820)     Initial Impression / Assessment and Plan / ED Course  I have reviewed the triage vital signs and the nursing notes.  Pertinent labs & imaging results that were available during my care of the patient were reviewed by me and considered in my medical decision making (see chart for details).  Clinical Course    Patient presents with complaints of persistent nausea and vomiting since consuming inexpensive vodka yesterday evening. He is also habitual marijuana user. I believe that both of these are likely contributing to his nausea and vomiting. He does have a white count, however his abdominal exam is benign and he is complaining of no pain. I highly doubt appendicitis or other acute intra-abdomina. He is feeling better after IV fluids and anti-emetics and I believe is appropriate for discharge. He will be given a prescription for Phenergan and advised to return as needed if symptoms significantly worsen or change.  Final Clinical Impressions(s) / ED Diagnoses   Final diagnoses:  None    New Prescriptions New Prescriptions   No medications on file     Geoffery Lyons, MD 05/23/16 1144

## 2016-05-23 NOTE — Discharge Instructions (Signed)
Phenergan as prescribed as needed for nausea.  Return to the emergency department if your symptoms significantly worsen or change.

## 2016-05-23 NOTE — ED Triage Notes (Signed)
Pt in c/o n/v/d onset last night, pt reports x30 vomiting episodes, pt reports x 1 liquid stool in the last 24 hrs, pt denies reg ETOH use, pt reports drinking Cherry Vodka last night, A&O x4

## 2016-05-25 ENCOUNTER — Encounter (HOSPITAL_COMMUNITY): Payer: Self-pay | Admitting: Emergency Medicine

## 2016-05-25 ENCOUNTER — Emergency Department (HOSPITAL_COMMUNITY): Payer: Self-pay

## 2016-05-25 ENCOUNTER — Inpatient Hospital Stay (HOSPITAL_COMMUNITY)
Admission: EM | Admit: 2016-05-25 | Discharge: 2016-05-28 | DRG: 392 | Disposition: A | Discharge status: Home / Self Care | Payer: Self-pay | Attending: Internal Medicine | Admitting: Internal Medicine

## 2016-05-25 DIAGNOSIS — F17210 Nicotine dependence, cigarettes, uncomplicated: Secondary | ICD-10-CM | POA: Diagnosis present

## 2016-05-25 DIAGNOSIS — E876 Hypokalemia: Secondary | ICD-10-CM | POA: Diagnosis present

## 2016-05-25 DIAGNOSIS — F17200 Nicotine dependence, unspecified, uncomplicated: Secondary | ICD-10-CM | POA: Diagnosis present

## 2016-05-25 DIAGNOSIS — F12988 Cannabis use, unspecified with other cannabis-induced disorder: Secondary | ICD-10-CM

## 2016-05-25 DIAGNOSIS — G40909 Epilepsy, unspecified, not intractable, without status epilepticus: Secondary | ICD-10-CM

## 2016-05-25 DIAGNOSIS — D72829 Elevated white blood cell count, unspecified: Secondary | ICD-10-CM | POA: Diagnosis present

## 2016-05-25 DIAGNOSIS — R109 Unspecified abdominal pain: Secondary | ICD-10-CM

## 2016-05-25 DIAGNOSIS — T407X5A Adverse effect of cannabis (derivatives), initial encounter: Secondary | ICD-10-CM | POA: Diagnosis present

## 2016-05-25 DIAGNOSIS — Z9119 Patient's noncompliance with other medical treatment and regimen: Secondary | ICD-10-CM

## 2016-05-25 DIAGNOSIS — R1110 Vomiting, unspecified: Secondary | ICD-10-CM

## 2016-05-25 DIAGNOSIS — R1116 Cannabis hyperemesis syndrome: Secondary | ICD-10-CM | POA: Diagnosis present

## 2016-05-25 DIAGNOSIS — R1013 Epigastric pain: Secondary | ICD-10-CM | POA: Diagnosis present

## 2016-05-25 DIAGNOSIS — E875 Hyperkalemia: Secondary | ICD-10-CM

## 2016-05-25 DIAGNOSIS — R1084 Generalized abdominal pain: Secondary | ICD-10-CM

## 2016-05-25 DIAGNOSIS — F1290 Cannabis use, unspecified, uncomplicated: Secondary | ICD-10-CM | POA: Diagnosis present

## 2016-05-25 DIAGNOSIS — R112 Nausea with vomiting, unspecified: Principal | ICD-10-CM | POA: Diagnosis present

## 2016-05-25 DIAGNOSIS — F1210 Cannabis abuse, uncomplicated: Secondary | ICD-10-CM | POA: Diagnosis present

## 2016-05-25 HISTORY — DX: Personal history of other specified conditions: Z87.898

## 2016-05-25 HISTORY — DX: Nicotine dependence, unspecified, uncomplicated: F17.200

## 2016-05-25 LAB — COMPREHENSIVE METABOLIC PANEL
ALBUMIN: 4 g/dL (ref 3.5–5.0)
ALK PHOS: 54 U/L (ref 38–126)
ALT: 24 U/L (ref 17–63)
ANION GAP: 9 (ref 5–15)
AST: 36 U/L (ref 15–41)
BILIRUBIN TOTAL: 0.8 mg/dL (ref 0.3–1.2)
BUN: 21 mg/dL — AB (ref 6–20)
CALCIUM: 7.5 mg/dL — AB (ref 8.9–10.3)
CO2: 18 mmol/L — AB (ref 22–32)
Chloride: 114 mmol/L — ABNORMAL HIGH (ref 101–111)
Creatinine, Ser: 0.77 mg/dL (ref 0.61–1.24)
GFR calc Af Amer: >60 mL/min (ref >60)
GFR calc non Af Amer: >60 mL/min (ref >60)
GLUCOSE: 116 mg/dL — AB (ref 65–99)
Potassium: 2.7 mmol/L — CL (ref 3.5–5.1)
SODIUM: 141 mmol/L (ref 135–145)
TOTAL PROTEIN: 6.4 g/dL — AB (ref 6.5–8.1)

## 2016-05-25 LAB — URINALYSIS, ROUTINE W REFLEX MICROSCOPIC
BILIRUBIN URINE: NEGATIVE
GLUCOSE, UA: NEGATIVE mg/dL
HGB URINE DIPSTICK: NEGATIVE
KETONES UR: 15 mg/dL — AB
Leukocytes, UA: NEGATIVE
Nitrite: NEGATIVE
PROTEIN: NEGATIVE mg/dL
Specific Gravity, Urine: 1.024 (ref 1.005–1.030)
pH: 7.5 (ref 5.0–8.0)

## 2016-05-25 LAB — DIFFERENTIAL
BASOS ABS: 0 10*3/uL (ref 0.0–0.1)
BASOS PCT: 0 %
EOS ABS: 0.1 10*3/uL (ref 0.0–0.7)
Eosinophils Relative: 0 %
LYMPHS ABS: 1.7 10*3/uL (ref 0.7–4.0)
Lymphocytes Relative: 8 %
MONO ABS: 1.5 10*3/uL — AB (ref 0.1–1.0)
MONOS PCT: 7 %
NEUTROS ABS: 17.8 10*3/uL — AB (ref 1.7–7.7)
Neutrophils Relative %: 85 %

## 2016-05-25 LAB — RAPID URINE DRUG SCREEN, HOSP PERFORMED
Amphetamines: NOT DETECTED
Barbiturates: NOT DETECTED
Benzodiazepines: POSITIVE — AB
Cocaine: NOT DETECTED
OPIATES: NOT DETECTED
Tetrahydrocannabinol: POSITIVE — AB

## 2016-05-25 LAB — LIPASE, BLOOD: Lipase: 18 U/L (ref 11–51)

## 2016-05-25 LAB — CBC
HCT: 45.3 % (ref 39.0–52.0)
Hemoglobin: 16.1 g/dL (ref 13.0–17.0)
MCH: 31 pg (ref 26.0–34.0)
MCHC: 35.5 g/dL (ref 30.0–36.0)
MCV: 87.1 fL (ref 78.0–100.0)
PLATELETS: 254 10*3/uL (ref 150–400)
RBC: 5.2 MIL/uL (ref 4.22–5.81)
RDW: 13.2 % (ref 11.5–15.5)
WBC: 21 10*3/uL — ABNORMAL HIGH (ref 4.0–10.5)

## 2016-05-25 MED ORDER — FENTANYL CITRATE (PF) 100 MCG/2ML IJ SOLN
50.0000 ug | Freq: Once | INTRAMUSCULAR | Status: AC
  Administered 2016-05-26: 50 ug via INTRAVENOUS
  Filled 2016-05-25: qty 2

## 2016-05-25 MED ORDER — LORAZEPAM 2 MG/ML IJ SOLN
0.5000 mg | Freq: Once | INTRAMUSCULAR | Status: AC
  Administered 2016-05-25: 0.5 mg via INTRAVENOUS
  Filled 2016-05-25: qty 1

## 2016-05-25 MED ORDER — IOPAMIDOL (ISOVUE-300) INJECTION 61%
100.0000 mL | Freq: Once | INTRAVENOUS | Status: AC | PRN
  Administered 2016-05-25: 100 mL via INTRAVENOUS

## 2016-05-25 MED ORDER — SODIUM CHLORIDE 0.9 % IV SOLN
INTRAVENOUS | Status: DC
  Administered 2016-05-25: 21:00:00 via INTRAVENOUS

## 2016-05-25 MED ORDER — POTASSIUM CHLORIDE 10 MEQ/100ML IV SOLN
10.0000 meq | INTRAVENOUS | Status: AC
  Administered 2016-05-25 – 2016-05-26 (×6): 10 meq via INTRAVENOUS
  Filled 2016-05-25 (×6): qty 100

## 2016-05-25 MED ORDER — FENTANYL CITRATE (PF) 100 MCG/2ML IJ SOLN
50.0000 ug | Freq: Once | INTRAMUSCULAR | Status: AC
Start: 2016-05-25 — End: 2016-05-25
  Administered 2016-05-25: 50 ug via INTRAVENOUS
  Filled 2016-05-25: qty 2

## 2016-05-25 MED ORDER — LORAZEPAM 2 MG/ML IJ SOLN
0.5000 mg | Freq: Once | INTRAMUSCULAR | Status: AC
  Administered 2016-05-26: 0.5 mg via INTRAVENOUS
  Filled 2016-05-25: qty 1

## 2016-05-25 MED ORDER — SODIUM CHLORIDE 0.9 % IV SOLN
INTRAVENOUS | Status: DC
  Administered 2016-05-25: 20:00:00 via INTRAVENOUS

## 2016-05-25 MED ORDER — SODIUM CHLORIDE 0.9 % IV BOLUS (SEPSIS)
1000.0000 mL | Freq: Once | INTRAVENOUS | Status: AC
  Administered 2016-05-25: 1000 mL via INTRAVENOUS

## 2016-05-25 NOTE — ED Triage Notes (Signed)
Pt got 4mg  zofran from ems

## 2016-05-25 NOTE — ED Notes (Signed)
Reported a critical k+ of 2.2 to provider

## 2016-05-25 NOTE — ED Notes (Signed)
Soda given to pts guest per her request

## 2016-05-25 NOTE — ED Provider Notes (Signed)
[facility]-EMERGENCY DEPT Provider Note   CSN: [id number] Arrival date & time: 05/25/16  1478  First Provider Contact:  First MD Initiated Contact with Patient 05/25/16 1925     Patient presents with concern of abdominal pain, nausea, vomiting. Notably, the patient was seen 2 days ago for an episode of nausea, vomiting, states that since that time he has had persistent nausea, but only developed abdominal pain today. No relief with medication provided on discharge 2 days ago. No current fever, confusion, disorientation, chest pain, diarrhea. Patient states that his last alcohol drink was 2 days ago, also acknowledge a history of using marijuana.   History   Chief Complaint Chief Complaint  Patient presents with  . Abdominal Pain    HPI Jaime Nixon is a 20 y.o. male.  HPI  Past Medical History:  Diagnosis Date  . Seizures [facility])     Patient Active Problem List   Diagnosis Date Noted  . Leukocytosis 12/27/2015  . Scalp laceration 12/27/2015  . Abnormal finding on MRI of brain   . Seizure (HCC)   . Pachymeningitis     History reviewed. No pertinent surgical history.     Home Medications    Prior to Admission medications   Medication Sig Start Date End Date Taking? Authorizing Provider  levETIRAcetam (KEPPRA) 500 MG tablet Take 1 tablet (500 mg total) by mouth 2 (two) times daily. 04/18/16   Roxy Horseman, PA-C  nicotine (NICODERM CQ) 21 mg/24hr patch Place 1 patch (21 mg total) onto the skin daily. Patient not taking: Reported on 04/18/2016 01/01/16   Pete Glatter, MD  ondansetron (ZOFRAN ODT) 4 MG disintegrating tablet Take 1 tablet (4 mg total) by mouth every 8 (eight) hours as needed for nausea. Patient not taking: Reported on 04/18/2016 12/05/14   Eber Hong, MD  promethazine (PHENERGAN) 25 MG tablet Take 1 tablet (25 mg total) by mouth every 6 (six) hours as needed for nausea or vomiting. 05/23/16   Geoffery Lyons, MD    Family History Family History  Problem  Relation Age of Onset  . Seizures Father   . Seizures Brother     Social History Social History  Substance Use Topics  . Smoking status: Current Every Day Smoker    Packs/day: 1.00    Types: Cigarettes  . Smokeless tobacco: Not on file  . Alcohol use No     Allergies   Dexamethasone and Prednisone   Review of Systems Review of Systems  Constitutional:       Per HPI, otherwise negative  HENT:       Per HPI, otherwise negative  Respiratory:       Per HPI, otherwise negative  Cardiovascular:       Per HPI, otherwise negative  Gastrointestinal: Positive for abdominal pain, nausea and vomiting.  Endocrine:       Negative aside from HPI  Genitourinary:       Neg aside from HPI   Musculoskeletal:       Per HPI, otherwise negative  Skin: Negative.   Neurological: Negative for syncope.     Physical Exam Updated Vital Signs BP 121/99 (BP Location: Right Arm)   Pulse 96   Temp 98.3 F (36.8 C) (Oral)   Resp 24   SpO2 100%   Physical Exam  Constitutional: He is oriented to person, place, and time. He appears well-developed and well-nourished. He appears distressed.  Uncomfortable appearing young male awake, alert, answering questions appropriate.  HENT:  Head: Normocephalic  and atraumatic.  Eyes: Conjunctivae and EOM are normal.  Cardiovascular: Normal rate and regular rhythm.   Pulmonary/Chest: Effort normal. No stridor. No respiratory distress.  Abdominal: He exhibits no distension. There is tenderness.  Generalized discomfort, with palpation, guarding throughout, seemingly most prominent in the upper abdomen.  Musculoskeletal: He exhibits no edema.  Neurological: He is alert and oriented to person, place, and time.  Skin: Skin is warm and dry.  Psychiatric: He has a normal mood and affect.  Nursing note and vitals reviewed.    ED Treatments / Results  Labs (all labs ordered are listed, but only abnormal results are displayed) Labs Reviewed  CBC -  Abnormal; Notable for the following:       Result Value   WBC 21.0 (*)    All other components within normal limits  URINALYSIS, ROUTINE W REFLEX MICROSCOPIC (NOT AT [facility]) - Abnormal; Notable for the following:    Ketones, ur 15 (*)    All other components within normal limits  URINE RAPID DRUG SCREEN, HOSP PERFORMED - Abnormal; Notable for the following:    Benzodiazepines POSITIVE (*)    Tetrahydrocannabinol POSITIVE (*)    All other components within normal limits  COMPREHENSIVE METABOLIC PANEL - Abnormal; Notable for the following:    Potassium 2.7 (*)    Chloride 114 (*)    CO2 18 (*)    Glucose, Bld 116 (*)    BUN 21 (*)    Calcium 7.5 (*)    Total Protein 6.4 (*)    All other components within normal limits  DIFFERENTIAL - Abnormal; Notable for the following:    Neutro Abs 17.8 (*)    Monocytes Absolute 1.5 (*)    All other components within normal limits  LIPASE, BLOOD  URINALYSIS, ROUTINE W REFLEX MICROSCOPIC (NOT AT [facility])  CBC WITH DIFFERENTIAL/PLATELET   Chart review notable for recent evaluation, 2 days ago for nausea, vomiting.   Procedures Procedures (including critical care time)  Medications Ordered in ED Medications  0.9 %  sodium chloride infusion ( Intravenous Stopped 05/25/16 2051)  sodium chloride 0.9 % bolus 1,000 mL (0 mLs Intravenous Stopped 05/25/16 2051)    And  0.9 %  sodium chloride infusion ( Intravenous New Bag/Given 05/25/16 2105)  potassium chloride 10 mEq in 100 mL IVPB (10 mEq Intravenous New Bag/Given 05/25/16 2225)  LORazepam (ATIVAN) injection 0.5 mg (not administered)  fentaNYL (SUBLIMAZE) injection 50 mcg (not administered)  fentaNYL (SUBLIMAZE) injection 50 mcg (50 mcg Intravenous Given 05/25/16 1953)  LORazepam (ATIVAN) injection 0.5 mg (0.5 mg Intravenous Given 05/25/16 1955)  iopamidol (ISOVUE-300) 61 % injection 100 mL (100 mLs Intravenous Contrast Given 05/25/16 2238)     Initial Impression / Assessment and Plan / ED Course  I  have reviewed the triage vital signs and the nursing notes.  Pertinent labs & imaging results that were available during my care of the patient were reviewed by me and considered in my medical decision making (see chart for details).  Clinical Course  Comment By Time  Patient back from CT, continues to receive IV K Gerhard Munch, MD 07/29 2311    11:22 PM Patient continues to complain of nausea, abdominal pain. I reviewed all findings with patient and his companion. With persistent nausea, vomiting, by mouth intolerance, patient is relying on potassium supplementation via IV.   EKG Interpretation  Date/Time:  Saturday May 25 2016 22:54:52 EDT Ventricular Rate:  74 PR Interval:    QRS Duration: 87 QT  Interval:  401 QTC Calculation: 445 R Axis:   70 Text Interpretation:  Sinus rhythm Artifact T wave abnormality Abnormal ekg Confirmed by Gerhard Munch  MD [phone]) on 05/25/2016 11:13:11 PM        Final Clinical Impressions(s) / ED Diagnoses   Patient presents with concern of abdominal pain, nausea, vomiting. She was very uncomfortable on initial evaluation, has additional vomiting, and ongoing pain throughout his ED course. Pain: Likely controlled with fentanyl, nausea controlled with Ativan, Zofran. Patient's labs are notable for leukocytosis, and hypokalemia, requiring IV supplementation. Patient is intolerant of oral intake, the patient on IV supplementation. CT scan reassuring, but with leukocytosis, hypokalemia, ongoing nausea, vomiting, by mouth intolerance, patient was admitted for continued IV fluid resuscitation, IV potassium.   Gerhard Munch, MD 05/26/16 [phone]

## 2016-05-25 NOTE — H&P (Signed)
History and Physical    CLAY SOLUM EAV:[id number] DOB: Mar 20, 1996 DOA: 05/25/2016  PCP: Pete Glatter, MD Consultants:  None Patient coming from: home - lives with wife and 77mo baby  Chief Complaint: n/v  HPI: Jaime Nixon is a 20 y.o. male with medical history significant of seizure disorder (recent diagnosis, 3/17) presenting with recurrent n/v (3rd episode since 3/17).  Patient reports n/v since early Thursday morning.  Was seen at [facility] later Thursday AM.  Had been drinking alcohol the night before, "very drunk" and also smoked marijuana.  N/V, dry heaving, couldn't stop.  Was given IVF, nausea medicine and sent home.  Was given Phenergan Thursday evening, has taken 11 of them since (taking 2 at a time by mouth).  Has continued to have symptoms.  Was able to eat this morning without difficulty (bacon egg and cheese biscuit), but then developed nausea and ate [employer] and started vomiting again.  Started having abdominal spasms, hands cramping, heart beating fast, whole body went numb.  Last emesis was about 530pm today.  +epigastric abdominal pain.  Last marijuana was yesterday, uses regularly if not daily.   ED Course: Persistent n/v/abd pain; given Fentanyl, Ativan, Zofran.  Significant hypokalemia, also with leukocytosis.  Negative CT.  Review of Systems: As per HPI; otherwise 10 point review of systems reviewed and negative.   Ambulatory Status:  Ambulatory without assistance  Past Medical History:  Diagnosis Date  . Seizures (HCC)     History reviewed. No pertinent surgical history.  Social History   Social History  . Marital status: Single    Spouse name: N/A  . Number of children: N/A  . Years of education: N/A   Occupational History  . landscaper    Social History Main Topics  . Smoking status: Current Every Day Smoker    Packs/day: 1.00    Years: 10.00    Types: Cigarettes  . Smokeless tobacco: Never Used  . Alcohol use Yes     Comment: rare use, but  binges occasionally  . Drug use:     Types: Marijuana     Comment: every other day  . Sexual activity: Not on file   Other Topics Concern  . Not on file   Social History Narrative  . No narrative on file    Allergies  Allergen Reactions  . Dexamethasone Shortness Of Breath and Other (See Comments)    Reaction:  Makes pt angry   . Prednisone Other (See Comments)    Reaction:  Makes pt angry     Family History  Problem Relation Age of Onset  . Seizures Father   . Seizures Brother     Prior to Admission medications   Medication Sig Start Date End Date Taking? Authorizing Provider  acetaminophen (TYLENOL) 500 MG tablet Take 1,000 mg by mouth every 6 (six) hours as needed for mild pain, moderate pain or headache.   Yes Historical Provider, MD  levETIRAcetam (KEPPRA) 500 MG tablet Take 1 tablet (500 mg total) by mouth 2 (two) times daily. 04/18/16  Yes Roxy Horseman, PA-C  promethazine (PHENERGAN) 25 MG tablet Take 1 tablet (25 mg total) by mouth every 6 (six) hours as needed for nausea or vomiting. 05/23/16  Yes Geoffery Lyons, MD  nicotine (NICODERM CQ) 21 mg/24hr patch Place 1 patch (21 mg total) onto the skin daily. Patient not taking: Reported on 04/18/2016 01/01/16   Pete Glatter, MD    Physical Exam: Vitals:   05/25/16 1910  05/25/16 1922 05/25/16 2128 05/25/16 2302  BP:  121/99 121/99 123/89  Pulse:  96 67 71  Resp:  24 20 20   Temp:  98.3 F (36.8 C) 98.6 F (37 C) 97.7 F (36.5 C)  TempSrc:  Oral Oral Oral  SpO2: 100% 100% 98% 99%     General: Appears uncomfortable, defers questions to his wife, poor interaction Eyes:  PERRL, EOMI, normal lids, iris ENT:  grossly normal hearing, lips & tongue, mildly dry mucous membranes despite 3L IVF given in ER so far Neck:  no LAD, masses or thyromegaly Cardiovascular:  RRR, no m/r/g. No LE edema.  Respiratory:  CTA bilaterally, no w/r/r. Normal respiratory effort. Abdomen:  soft, nt other than with deep palpation in the  midepigastric region, nd, NABS Skin:  no rash or induration seen on limited exam Musculoskeletal:  grossly normal tone BUE/BLE, good ROM, no bony abnormality Psychiatric:  Flat affect, speech fluent and appropriate, AOx3, poor eye contact Neurologic:  CN 2-12 grossly intact, moves all extremities in coordinated fashion, sensation intact  Labs on Admission: I have personally reviewed following labs and imaging studies  CBC:  Recent Labs Lab 05/23/16 0810 05/25/16 1957  WBC 18.9* 21.0*  NEUTROABS 15.8* 17.8*  HGB 15.4 16.1  HCT 43.3 45.3  MCV 87.3 87.1  PLT 229 254   Basic Metabolic Panel:  Recent Labs Lab 05/23/16 0810 05/25/16 2108  NA 139 141  K 3.6 2.7*  CL 104 114*  CO2 23 18*  GLUCOSE 121* 116*  BUN 12 21*  CREATININE 0.79 0.77  CALCIUM 9.9 7.5*   GFR: CrCl cannot be calculated (Unknown ideal weight.). Liver Function Tests:  Recent Labs Lab 05/23/16 0810 05/25/16 2108  AST 33 36  ALT 17 24  ALKPHOS 65 54  BILITOT 0.8 0.8  PROT 7.1 6.4*  ALBUMIN 4.4 4.0    Recent Labs Lab 05/23/16 0810 05/25/16 2108  LIPASE 19 18   No results for input(s): AMMONIA in the last 168 hours. Coagulation Profile: No results for input(s): INR, PROTIME in the last 168 hours. Cardiac Enzymes: No results for input(s): CKTOTAL, CKMB, CKMBINDEX, TROPONINI in the last 168 hours. BNP (last 3 results) No results for input(s): PROBNP in the last 8760 hours. HbA1C: No results for input(s): HGBA1C in the last 72 hours. CBG: No results for input(s): GLUCAP in the last 168 hours. Lipid Profile: No results for input(s): CHOL, HDL, LDLCALC, TRIG, CHOLHDL, LDLDIRECT in the last 72 hours. Thyroid Function Tests: No results for input(s): TSH, T4TOTAL, FREET4, T3FREE, THYROIDAB in the last 72 hours. Anemia Panel: No results for input(s): VITAMINB12, FOLATE, FERRITIN, TIBC, IRON, RETICCTPCT in the last 72 hours. Urine analysis:    Component Value Date/Time   COLORURINE YELLOW  05/25/2016 1937   APPEARANCEUR CLEAR 05/25/2016 1937   LABSPEC 1.024 05/25/2016 1937   PHURINE 7.5 05/25/2016 1937   GLUCOSEU NEGATIVE 05/25/2016 1937   HGBUR NEGATIVE 05/25/2016 1937   BILIRUBINUR NEGATIVE 05/25/2016 1937   KETONESUR 15 (A) 05/25/2016 1937   PROTEINUR NEGATIVE 05/25/2016 1937   UROBILINOGEN 0.2 06/01/2015 0503   NITRITE NEGATIVE 05/25/2016 1937   LEUKOCYTESUR NEGATIVE 05/25/2016 1937    Creatinine Clearance: CrCl cannot be calculated (Unknown ideal weight.).  Sepsis Labs: @LABRCNTIP (procalcitonin:4,lacticidven:4) )No results found for this or any previous visit (from the past 240 hour(s)).   Radiological Exams on Admission: Ct Abdomen Pelvis W Contrast  Result Date: 05/25/2016 CLINICAL DATA:  Global abd pain for several days with N/V. leucocytosis EXAM: CT ABDOMEN  AND PELVIS WITH CONTRAST TECHNIQUE: Multidetector CT imaging of the abdomen and pelvis was performed using the standard protocol following bolus administration of intravenous contrast. CONTRAST:  ISOVUE-300 IOPAMIDOL (ISOVUE-300) INJECTION 61% COMPARISON:  Pelvic CT, 06/01/2015 FINDINGS: Lung bases:  Clear.  Heart normal size. Liver, spleen, gallbladder, pancreas, adrenal glands:  Normal. Kidneys, ureters, bladder:  Normal. Lymph nodes:  No adenopathy. Ascites:  None. Gastrointestinal:  Normal.  Normal appendix visualized. Musculoskeletal:  Unremarkable. IMPRESSION: Normal enhanced CT scan of the abdomen pelvis. Electronically Signed   By: Amie Portland M.D.   On: 05/25/2016 22:59   EKG: Independently reviewed.  NSR with rate 74; nonspecific ST changes with no evidence of acute ischemia  Assessment/Plan Principal Problem:   Cannabinoid hyperemesis syndrome (HCC) Active Problems:   Leukocytosis   Seizure disorder (HCC)   Hypokalemia due to loss of potassium   Tobacco dependence   Cannabinoid hyperemesis syndrome -Patient with regular use of marijuana -This is at least his 3rd episode of  hyperemesis requiring ER visit -Evaluation otherwise unremarkable, CT negative -Suspect that chronic marijuana use is the source of his recurrent vomiting -Suggest cessation -Will request SW consult for substance abuse -Will place in observation status (should not need telemetry) -Nausea control with Zofran -Pain control with non-narcotic options like Tylenol and Toradol -UDS positive for BZD - unclear if this was before or after given Ativan in ER; question whether he is attempting to treat an underlying psychiatric disorder like anxiety with his continued marijuana use; consider [facility] consultation when feeling better.  Will not give additional BZD for now -Requesting sleep control medication - will give trazodone  Leukocytosis -Elevated on both recent ER visits but not prior -Suspect demargination -Will recheck in AM  Hypokalemia -Resulting from persistent vomiting -Repleted with x 6 IV runs in ER, which would be expected to rise serum potassium to 3.3 -Will also run d51/2NS with 40 mEq KCL/L at 125 cc/hr x 12 hours which would be expected to fully correct deficiency  Seizure disorder -Diagnosed in March 2017 -Was not regularly taking Keppra and returned to ER in 6/17 -Will continue Keppra.  Patient should be queried regarding compliance.  Tobacco dependence -Encourage cessation.  This was discussed with the patient and should be reviewed on an ongoing basis.   -Patch ordered at patient request.    DVT prophylaxis: Lovenox  Code Status: Full - confirmed with patient/family Family Communication: Wife (?) present at bedside throughout Disposition Plan:  Home once clinically improved, likely tomorrow or Monday Consults called: SW for substance abuse; consider [facility] Admission status: Observation to Med Surg   Jonah Blue MD [facility]  If 7PM-7AM, please contact night-coverage [email or web address] Password [email or web address]  05/26/2016, 12:26 AM

## 2016-05-25 NOTE — ED Notes (Signed)
hospitalist in room  

## 2016-05-25 NOTE — ED Triage Notes (Signed)
Per EMS pt presents d/t generalized abdominal pain.  Seen at a facility several days ago w/ same sx.  Pt is tender to palpation.  +N/V.

## 2016-05-25 NOTE — ED Notes (Signed)
Bed: OL07 Expected date:  Expected time:  Means of arrival:  Comments: 20 yo abd pain

## 2016-05-25 NOTE — ED Notes (Signed)
Critical K+ of 2.2.  Notified Dr. Jeraldine Loots and Cletis Athens, RN.

## 2016-05-26 ENCOUNTER — Encounter (HOSPITAL_COMMUNITY): Payer: Self-pay

## 2016-05-26 DIAGNOSIS — E876 Hypokalemia: Secondary | ICD-10-CM

## 2016-05-26 DIAGNOSIS — F17200 Nicotine dependence, unspecified, uncomplicated: Secondary | ICD-10-CM | POA: Diagnosis present

## 2016-05-26 DIAGNOSIS — D72829 Elevated white blood cell count, unspecified: Secondary | ICD-10-CM

## 2016-05-26 DIAGNOSIS — R112 Nausea with vomiting, unspecified: Secondary | ICD-10-CM | POA: Diagnosis present

## 2016-05-26 DIAGNOSIS — F12988 Cannabis use, unspecified with other cannabis-induced disorder: Secondary | ICD-10-CM

## 2016-05-26 DIAGNOSIS — R1116 Cannabis hyperemesis syndrome: Secondary | ICD-10-CM | POA: Diagnosis present

## 2016-05-26 DIAGNOSIS — G40909 Epilepsy, unspecified, not intractable, without status epilepticus: Secondary | ICD-10-CM

## 2016-05-26 DIAGNOSIS — F1290 Cannabis use, unspecified, uncomplicated: Secondary | ICD-10-CM | POA: Diagnosis present

## 2016-05-26 LAB — BASIC METABOLIC PANEL
Anion gap: 5 (ref 5–15)
Anion gap: 5 (ref 5–15)
BUN: 14 mg/dL (ref 6–20)
BUN: 11 mg/dL (ref 6–20)
CALCIUM: 8.8 mg/dL — AB (ref 8.9–10.3)
CO2: 24 mmol/L (ref 22–32)
CO2: 26 mmol/L (ref 22–32)
CREATININE: 0.71 mg/dL (ref 0.61–1.24)
Calcium: 8 mg/dL — ABNORMAL LOW (ref 8.9–10.3)
Chloride: 109 mmol/L (ref 101–111)
Chloride: 108 mmol/L (ref 101–111)
Creatinine, Ser: 0.79 mg/dL (ref 0.61–1.24)
GFR calc Af Amer: >60 mL/min (ref >60)
GFR calc non Af Amer: >60 mL/min (ref >60)
GFR calc non Af Amer: >60 mL/min (ref >60)
Glucose, Bld: 127 mg/dL — ABNORMAL HIGH (ref 65–99)
Glucose, Bld: 103 mg/dL — ABNORMAL HIGH (ref 65–99)
Potassium: 3.7 mmol/L (ref 3.5–5.1)
Potassium: 3.4 mmol/L — ABNORMAL LOW (ref 3.5–5.1)
SODIUM: 139 mmol/L (ref 135–145)
Sodium: 138 mmol/L (ref 135–145)

## 2016-05-26 LAB — MAGNESIUM: Magnesium: 2.1 mg/dL (ref 1.7–2.4)

## 2016-05-26 LAB — CBC
HCT: 39 % (ref 39.0–52.0)
Hemoglobin: 14.1 g/dL (ref 13.0–17.0)
MCH: 31.6 pg (ref 26.0–34.0)
MCHC: 36.2 g/dL — ABNORMAL HIGH (ref 30.0–36.0)
MCV: 87.4 fL (ref 78.0–100.0)
Platelets: 204 10*3/uL (ref 150–400)
RBC: 4.46 MIL/uL (ref 4.22–5.81)
RDW: 12.9 % (ref 11.5–15.5)
WBC: 13.3 10*3/uL — ABNORMAL HIGH (ref 4.0–10.5)

## 2016-05-26 MED ORDER — ACETAMINOPHEN 500 MG PO TABS
1000.0000 mg | ORAL_TABLET | Freq: Four times a day (QID) | ORAL | Status: DC | PRN
  Administered 2016-05-26 – 2016-05-28 (×2): 1000 mg via ORAL
  Filled 2016-05-26 (×2): qty 2

## 2016-05-26 MED ORDER — PROCHLORPERAZINE EDISYLATE 5 MG/ML IJ SOLN
10.0000 mg | Freq: Once | INTRAMUSCULAR | Status: AC
  Administered 2016-05-26: 10 mg via INTRAVENOUS
  Filled 2016-05-26: qty 2

## 2016-05-26 MED ORDER — KCL IN DEXTROSE-NACL 40-5-0.45 MEQ/L-%-% IV SOLN
INTRAVENOUS | Status: AC
  Administered 2016-05-26: 04:00:00 via INTRAVENOUS
  Filled 2016-05-26 (×2): qty 1000

## 2016-05-26 MED ORDER — ENOXAPARIN SODIUM 40 MG/0.4ML ~~LOC~~ SOLN
40.0000 mg | SUBCUTANEOUS | Status: DC
  Administered 2016-05-26 – 2016-05-27 (×2): 40 mg via SUBCUTANEOUS
  Filled 2016-05-26 (×3): qty 0.4

## 2016-05-26 MED ORDER — ONDANSETRON HCL 4 MG/2ML IJ SOLN
4.0000 mg | Freq: Four times a day (QID) | INTRAMUSCULAR | Status: DC | PRN
  Administered 2016-05-26 – 2016-05-27 (×3): 4 mg via INTRAVENOUS
  Filled 2016-05-26 (×5): qty 2

## 2016-05-26 MED ORDER — ONDANSETRON HCL 4 MG/2ML IJ SOLN: 4.0000 mg | Freq: Three times a day (TID) | INTRAMUSCULAR | Status: DC | PRN

## 2016-05-26 MED ORDER — LEVETIRACETAM 500 MG PO TABS
500.0000 mg | ORAL_TABLET | Freq: Two times a day (BID) | ORAL | Status: DC
  Administered 2016-05-26 – 2016-05-28 (×5): 500 mg via ORAL
  Filled 2016-05-26 (×6): qty 1

## 2016-05-26 MED ORDER — NICOTINE 21 MG/24HR TD PT24
21.0000 mg | MEDICATED_PATCH | Freq: Every day | TRANSDERMAL | Status: DC
  Administered 2016-05-26 – 2016-05-27 (×2): 21 mg via TRANSDERMAL
  Filled 2016-05-26 (×4): qty 1

## 2016-05-26 MED ORDER — ONDANSETRON HCL 4 MG PO TABS: 4.0000 mg | ORAL_TABLET | Freq: Four times a day (QID) | ORAL | Status: DC | PRN

## 2016-05-26 MED ORDER — SODIUM CHLORIDE 0.9 % IV SOLN
INTRAVENOUS | Status: DC
  Administered 2016-05-26 – 2016-05-28 (×6): via INTRAVENOUS

## 2016-05-26 MED ORDER — PROCHLORPERAZINE EDISYLATE 5 MG/ML IJ SOLN
10.0000 mg | Freq: Four times a day (QID) | INTRAMUSCULAR | Status: DC | PRN
  Administered 2016-05-26: 10 mg via INTRAVENOUS
  Filled 2016-05-26: qty 2

## 2016-05-26 MED ORDER — TRAZODONE HCL 50 MG PO TABS: 25.0000 mg | ORAL_TABLET | Freq: Every evening | ORAL | Status: DC | PRN

## 2016-05-26 MED ORDER — KETOROLAC TROMETHAMINE 15 MG/ML IJ SOLN
15.0000 mg | Freq: Four times a day (QID) | INTRAMUSCULAR | Status: DC | PRN
  Administered 2016-05-26 (×2): 15 mg via INTRAVENOUS
  Filled 2016-05-26 (×2): qty 1

## 2016-05-26 MED ORDER — SODIUM CHLORIDE 0.9 % IV SOLN
INTRAVENOUS | Status: AC
  Administered 2016-05-26: 02:00:00 via INTRAVENOUS

## 2016-05-26 NOTE — Progress Notes (Signed)
Patient was brought up to 6 east with a potassium of 2.7, asymptomatic, and an episode of desaturation in the ED. There were no transfer orders for telemetry or continuous pulse ox. This RN notified on call NP who placed a continuous pulse ox order but stated that tele was not indicated. Upon notifying the on call NP the indications for tele, she instructed this RN to call Dr. Ophelia Charter, who admitted the patient. Dr Ophelia Charter voiced that she was not informed of the patient's change of status and agreed that the patient needs to be on continuous pulse ox as well as tele. Dr Ophelia Charter had already left the hospital and delegated orders to be inputted by night coverage. Patient will be transferred to 1422.

## 2016-05-26 NOTE — Progress Notes (Signed)
pt having irregular rhythm, HR ranging 50s-70s. tele called about questionable junctional beats.EKG performed- sinus rhythm w/ sinus arrthythmia. MD notified. New orders received. Will continue to monitor patient on telemetry.  Earnest Conroy. Clelia Croft, RN

## 2016-05-26 NOTE — Progress Notes (Signed)
Dr Ophelia Charter has requested order be placed for transfer to telemetry floor d/t hypokalemia. Pt currently asymptomatic. EKG pending.

## 2016-05-26 NOTE — Progress Notes (Signed)
PROGRESS NOTE    Jaime Nixon  ZOX:[id number] DOB: February 14, 1996 DOA: 05/25/2016  PCP: Pete Glatter, MD   Brief Narrative:   Jaime Nixon is a 20 y.o. male with medical history significant of seizure disorder (recent diagnosis, 3/17) presenting with recurrent n/v (3rd episode since 3/17).  Failed outpatient phenergan.   Subjective: Currently no nausea but has no appetite and does not want to eat. No abdominal pain. No diarrhea or constipation.   Assessment & Plan:   Principal Problem:  vomiting-  Cannabinoid hyperemesis syndrome ? - normal CT of abdomen and pelvis - symptomatic management- advance diet as tolerated, cont IVF  Active Problems:    Leukocytosis - stress response? - improving    Seizure disorder - Keppra    Hypokalemia due to loss of potassium - replace as needed    Tobacco dependence - advised to stop smoking   DVT prophylaxis: Lovenox Code Status: full code Family Communication:  Disposition Plan: home in 1-2 days Consultants:   none Procedures:   none Antimicrobials:  Anti-infectives    None       Objective: Vitals:   05/26/16 0130 05/26/16 0247 05/26/16 0501 05/26/16 1106  BP: (!) 97/59 137/75 135/75   Pulse: 60 74 76   Resp: Temp: 99.5 F (37.5 C) 99.6 F (37.6 C) 99.1 F (37.3 C) 98.1 F (36.7 C)  TempSrc: Oral Oral  Oral  SpO2: 100% 100% 100%   Weight:  83.6 kg (184 lb 3.2 oz)    Height:  6' (1.829 m)      Intake/Output Summary (Last 24 hours) at 05/26/16 1156 Last data filed at 05/26/16 1000  Gross per 24 hour  Intake             1215 ml  Output                0 ml  Net             1215 ml   Filed Weights   05/26/16 0100 05/26/16 0247  Weight: 83.4 kg (183 lb 13.8 oz) 83.6 kg (184 lb 3.2 oz)    Examination: General exam: Appears comfortable  HEENT: PERRLA, oral mucosa moist, no sclera icterus or thrush Respiratory system: Clear to auscultation. Respiratory effort normal. Cardiovascular system:  S1 & S2 heard, RRR.  No murmurs  Gastrointestinal system: Abdomen soft, non-tender, nondistended. Normal bowel sound. No organomegaly Central nervous system: Alert and oriented. No focal neurological deficits. Extremities: No cyanosis, clubbing or edema Skin: No rashes or ulcers Psychiatry:  Mood & affect appropriate.     Data Reviewed: I have personally reviewed following labs and imaging studies  CBC:  Recent Labs Lab 05/23/16 0810 05/25/16 1957 05/26/16 0501  WBC 18.9* 21.0* 13.3*  NEUTROABS 15.8* 17.8*  --   HGB 15.4 16.1 14.1  HCT 43.3 45.3 39.0  MCV 87.3 87.1 87.4  PLT 229 254 204   Basic Metabolic Panel:  Recent Labs Lab 05/23/16 0810 05/25/16 2108 05/26/16 0501  NA 139 141 138  K 3.6 2.7* 3.7  CL 104 114* 109  CO2 23 18* 24  GLUCOSE 121* 116* 127*  BUN 12 21* 14  CREATININE 0.79 0.77 0.79  CALCIUM 9.9 7.5* 8.0*   GFR: Estimated Creatinine Clearance: 160.3 mL/min (by C-G formula based on SCr of 0.8 mg/dL). Liver Function Tests:  Recent Labs Lab 05/23/16 0810 05/25/16 2108  AST 33 36  ALT 17 24  ALKPHOS 65 54  BILITOT 0.8 0.8  PROT 7.1 6.4*  ALBUMIN 4.4 4.0    Recent Labs Lab 05/23/16 0810 05/25/16 2108  LIPASE 19 18   No results for input(s): AMMONIA in the last 168 hours. Coagulation Profile: No results for input(s): INR, PROTIME in the last 168 hours. Cardiac Enzymes: No results for input(s): CKTOTAL, CKMB, CKMBINDEX, TROPONINI in the last 168 hours. BNP (last 3 results) No results for input(s): PROBNP in the last 8760 hours. HbA1C: No results for input(s): HGBA1C in the last 72 hours. CBG: No results for input(s): GLUCAP in the last 168 hours. Lipid Profile: No results for input(s): CHOL, HDL, LDLCALC, TRIG, CHOLHDL, LDLDIRECT in the last 72 hours. Thyroid Function Tests: No results for input(s): TSH, T4TOTAL, FREET4, T3FREE, THYROIDAB in the last 72 hours. Anemia Panel: No results for input(s): VITAMINB12, FOLATE, FERRITIN,  TIBC, IRON, RETICCTPCT in the last 72 hours. Urine analysis:    Component Value Date/Time   COLORURINE YELLOW 05/25/2016 1937   APPEARANCEUR CLEAR 05/25/2016 1937   LABSPEC 1.024 05/25/2016 1937   PHURINE 7.5 05/25/2016 1937   GLUCOSEU NEGATIVE 05/25/2016 1937   HGBUR NEGATIVE 05/25/2016 1937   BILIRUBINUR NEGATIVE 05/25/2016 1937   KETONESUR 15 (A) 05/25/2016 1937   PROTEINUR NEGATIVE 05/25/2016 1937   UROBILINOGEN 0.2 06/01/2015 0503   NITRITE NEGATIVE 05/25/2016 1937   LEUKOCYTESUR NEGATIVE 05/25/2016 1937   Sepsis Labs: @LABRCNTIP (procalcitonin:4,lacticidven:4) )No results found for this or any previous visit (from the past 240 hour(s)).       Radiology Studies: Ct Abdomen Pelvis W Contrast  Result Date: 05/25/2016 CLINICAL DATA:  Global abd pain for several days with N/V. leucocytosis EXAM: CT ABDOMEN AND PELVIS WITH CONTRAST TECHNIQUE: Multidetector CT imaging of the abdomen and pelvis was performed using the standard protocol following bolus administration of intravenous contrast. CONTRAST:  ISOVUE-300 IOPAMIDOL (ISOVUE-300) INJECTION 61% COMPARISON:  Pelvic CT, 06/01/2015 FINDINGS: Lung bases:  Clear.  Heart normal size. Liver, spleen, gallbladder, pancreas, adrenal glands:  Normal. Kidneys, ureters, bladder:  Normal. Lymph nodes:  No adenopathy. Ascites:  None. Gastrointestinal:  Normal.  Normal appendix visualized. Musculoskeletal:  Unremarkable. IMPRESSION: Normal enhanced CT scan of the abdomen pelvis. Electronically Signed   By: Amie Portland M.D.   On: 05/25/2016 22:59     Scheduled Meds: . sodium chloride   Intravenous STAT  . enoxaparin (LOVENOX) injection  40 mg Subcutaneous Q24H  . levETIRAcetam  500 mg Oral BID  . nicotine  21 mg Transdermal Daily   Continuous Infusions: . dextrose 5 % and 0.45 % NaCl with KCl 40 mEq/L 125 mL/hr at 05/26/16 0400     LOS: 0 days    Time spent in minutes: 35    Jaime Bartosik, MD [facility] Pager: [email or web address] Password [email or web address] 05/26/2016, 11:56 AM

## 2016-05-26 NOTE — Progress Notes (Addendum)
Patient ate 100% of breakfast (pancake, sausage, cereal) around 10am. Denies any nausea at this time, will continue to monitor. Update: patient states he threw up all his breakfast around 11am. States he had 3 episodes of emesis with particles of food and bile.  Earnest Conroy. Clelia Croft, RN

## 2016-05-26 NOTE — Clinical Social Work Note (Signed)
CSW received consult for patient having current substance abuse issues.  CSW attempted to see patient, but patient was sleeping and did not want to wake up.  CSW will attempt at a later time.  Ervin Knack. Tieasha Larsen, MSW, LCSWA [phone] 05/26/2016 2:07 PM

## 2016-05-26 NOTE — Discharge Summary (Addendum)
Physician Discharge Summary  Jaime Nixon VHQ:[id number] DOB: 09-17-1996 DOA: 05/25/2016  PCP: Pete Glatter, MD  Admit date: 05/25/2016 Discharge date: 05/28/2016  Admitted From: home Disposition:  home   Recommendations for Outpatient Follow-up:  1. Needs to stop drug use and adhere to medications  Home Health:  none Equipment/Devices:  none    Discharge Condition:  stable   CODE STATUS:  Full code   Diet recommendation:  Heart healthy Consultations:  none    Discharge Diagnoses:  Principal Problem:   Nausea and vomiting--Cannabinoid hyperemesis syndrome (HCC) Active Problems:   Leukocytosis   Seizure disorder (HCC)   Hypokalemia due to loss of potassium   Tobacco dependence       Subjective: Was feeling great this AM. IV fell out in the bathroom and he decided to go outside to smoke. He returned and began having abdominal cramping and a panic attack. Given Ativan and Zofran and all symptoms resolved.   Brief Summary: Jaime Nixon a 20 y.o.malewith medical history significant of seizure disorder (recent diagnosis, 3/17) presenting with recurrent n/v (3rd episode since 3/17). Failed outpatient phenergan.   Hospital Course:  Principal Problem:  vomiting-  Cannabinoid hyperemesis syndrome ? - normal CT of abdomen and pelvis - symptomatic management - left the hospital today to smoke again and returned with recurrent cramping pain - stablized with Ativan and Zofran - can go home with oral Zofran and Phenergan-  - case management to assist with prescriptions  Active Problems:    Leukocytosis - stress response - improving    Seizure disorder - Keppra- non-compliant    Hypokalemia due to loss of potassium - replace as needed    Tobacco dependence- marijuana abuse - advised to stop    Discharge Instructions  Discharge Instructions    Diet - low sodium heart healthy    Complete by:  As directed   Discharge instructions    Complete by:  As  directed   You need to stop smoking cigarettes and stop using drugs   Increase activity slowly    Complete by:  As directed       Medication List    TAKE these medications   acetaminophen 500 MG tablet Commonly known as:  TYLENOL Take 1,000 mg by mouth every 6 (six) hours as needed for mild pain, moderate pain or headache.   levETIRAcetam 500 MG tablet Commonly known as:  KEPPRA Take 1 tablet (500 mg total) by mouth 2 (two) times daily.   ondansetron 4 MG tablet Commonly known as:  ZOFRAN Take 1 tablet (4 mg total) by mouth every 6 (six) hours as needed for nausea.   promethazine 25 MG tablet Commonly known as:  PHENERGAN Take 1 tablet (25 mg total) by mouth every 6 (six) hours as needed for nausea or vomiting.      Follow-up Information    [facility] Follow up on 07/03/2016.   Why:  Appointment on Weds. 07/03/16 at 9:30 AM. Please take discharge papers and all medications that you are taking with you.  telephone number [phone] Contact information: [address] [place] [address]         Allergies  Allergen Reactions  . Dexamethasone Shortness Of Breath and Other (See Comments)    Reaction:  Makes pt angry   . Prednisone Other (See Comments)    Reaction:  Makes pt angry      Procedures/Studies:   Ct Abdomen Pelvis W Contrast  Result Date: 05/25/2016 CLINICAL DATA:  Global abd pain for several days with N/V. leucocytosis EXAM: CT ABDOMEN AND PELVIS WITH CONTRAST TECHNIQUE: Multidetector CT imaging of the abdomen and pelvis was performed using the standard protocol following bolus administration of intravenous contrast. CONTRAST:  ISOVUE-300 IOPAMIDOL (ISOVUE-300) INJECTION 61% COMPARISON:  Pelvic CT, 06/01/2015 FINDINGS: Lung bases:  Clear.  Heart normal size. Liver, spleen, gallbladder, pancreas, adrenal glands:  Normal. Kidneys, ureters, bladder:  Normal. Lymph nodes:  No adenopathy. Ascites:  None. Gastrointestinal:   Normal.  Normal appendix visualized. Musculoskeletal:  Unremarkable. IMPRESSION: Normal enhanced CT scan of the abdomen pelvis. Electronically Signed   By: Amie Portland M.D.   On: 05/25/2016 22:59  Dg Abd 2 Views  Result Date: 05/27/2016 CLINICAL DATA:  Acute onset of vomiting early this morning, abdominal pain EXAM: ABDOMEN - 2 VIEW COMPARISON:  Abdominal CT scan of May 25, 2016 FINDINGS: The colonic stool burden is moderate. No free extraluminal gas collections are observed. There are no abnormal soft tissue calcifications. The bony structures are unremarkable. IMPRESSION: No acute intra-abdominal abnormality. Moderately increase colonic stool burden may reflect constipation in the appropriate clinical setting. Electronically Signed   By: David  [place] M.D.   On: 05/27/2016 07:14      Discharge Exam: Vitals:   05/28/16 0500 05/28/16 1408  BP: 119/69 138/86  Pulse: 64 70  Resp: 20 16  Temp: 98 F (36.7 C) 98.9 F (37.2 C)   Vitals:   05/27/16 1409 05/27/16 2203 05/28/16 0500 05/28/16 1408  BP: (!) 107/44 119/79 119/69 138/86  Pulse: 80 64 64 70  Resp: 19 20 20 16   Temp: 98.4 F (36.9 C) 98.6 F (37 C) 98 F (36.7 C) 98.9 F (37.2 C)  TempSrc: Oral Oral Oral Oral  SpO2: 100% 100% 100% 100%  Weight:      Height:        General: Pt is alert, awake, not in acute distress Cardiovascular: RRR, S1/S2 +, no rubs, no gallops Respiratory: CTA bilaterally, no wheezing, no rhonchi Abdominal: Soft, NT, ND, bowel sounds + Extremities: no edema, no cyanosis    The results of significant diagnostics from this hospitalization (including imaging, microbiology, ancillary and laboratory) are listed below for reference.     Microbiology: No results found for this or any previous visit (from the past 240 hour(s)).   Labs: BNP (last 3 results) No results for input(s): BNP in the last 8760 hours. Basic Metabolic Panel:  Recent Labs Lab 05/25/16 2108 05/26/16 0501 05/26/16 1521  05/27/16 0441 05/28/16 0453  NA 141 138 139 139 140  K 2.7* 3.7 3.4* 3.6 3.5  CL 114* 109 108 107 108  CO2 18* 24 26 28 26   GLUCOSE 116* 127* 103* 105* 98  BUN 21* 14 11 10 10   CREATININE 0.77 0.79 0.71 0.84 0.84  CALCIUM 7.5* 8.0* 8.8* 8.9 8.8*  MG  --   --  2.1  --   --    Liver Function Tests:  Recent Labs Lab 05/23/16 0810 05/25/16 2108  AST 33 36  ALT 17 24  ALKPHOS 65 54  BILITOT 0.8 0.8  PROT 7.1 6.4*  ALBUMIN 4.4 4.0    Recent Labs Lab 05/23/16 0810 05/25/16 2108  LIPASE 19 18   No results for input(s): AMMONIA in the last 168 hours. CBC:  Recent Labs Lab 05/23/16 0810 05/25/16 1957 05/26/16 0501 05/27/16 0441 05/28/16 0453  WBC 18.9* 21.0* 13.3* 14.7* 7.6  NEUTROABS 15.8* 17.8*  --   --   --  HGB 15.4 16.1 14.1 14.7 13.3  HCT 43.3 45.3 39.0 42.7 38.0*  MCV 87.3 87.1 87.4 88.4 88.2  PLT 229 254 204 220 193   Cardiac Enzymes: No results for input(s): CKTOTAL, CKMB, CKMBINDEX, TROPONINI in the last 168 hours. BNP: Invalid input(s): POCBNP CBG: No results for input(s): GLUCAP in the last 168 hours. D-Dimer No results for input(s): DDIMER in the last 72 hours. Hgb A1c No results for input(s): HGBA1C in the last 72 hours. Lipid Profile No results for input(s): CHOL, HDL, LDLCALC, TRIG, CHOLHDL, LDLDIRECT in the last 72 hours. Thyroid function studies No results for input(s): TSH, T4TOTAL, T3FREE, THYROIDAB in the last 72 hours.  Invalid input(s): FREET3 Anemia work up No results for input(s): VITAMINB12, FOLATE, FERRITIN, TIBC, IRON, RETICCTPCT in the last 72 hours. Urinalysis    Component Value Date/Time   COLORURINE YELLOW 05/25/2016 1937   APPEARANCEUR CLEAR 05/25/2016 1937   LABSPEC 1.024 05/25/2016 1937   PHURINE 7.5 05/25/2016 1937   GLUCOSEU NEGATIVE 05/25/2016 1937   HGBUR NEGATIVE 05/25/2016 1937   BILIRUBINUR NEGATIVE 05/25/2016 1937   KETONESUR 15 (A) 05/25/2016 1937   PROTEINUR NEGATIVE 05/25/2016 1937   UROBILINOGEN 0.2  06/01/2015 0503   NITRITE NEGATIVE 05/25/2016 1937   LEUKOCYTESUR NEGATIVE 05/25/2016 1937   Sepsis Labs Invalid input(s): PROCALCITONIN,  WBC,  LACTICIDVEN Microbiology No results found for this or any previous visit (from the past 240 hour(s)).   Time coordinating discharge: Over 30 minutes  SIGNED:   Calvert Cantor, MD  [facility] 05/28/2016, 2:20 PM Pager   If 7PM-7AM, please contact night-coverage [email or web address] Password [email or web address]

## 2016-05-26 NOTE — ED Notes (Signed)
Called report to [facility], and moving pt to [address] east

## 2016-05-27 ENCOUNTER — Inpatient Hospital Stay (HOSPITAL_COMMUNITY): Payer: Self-pay

## 2016-05-27 DIAGNOSIS — R112 Nausea with vomiting, unspecified: Principal | ICD-10-CM

## 2016-05-27 LAB — BASIC METABOLIC PANEL
ANION GAP: 4 — AB (ref 5–15)
BUN: 10 mg/dL (ref 6–20)
CHLORIDE: 107 mmol/L (ref 101–111)
CO2: 28 mmol/L (ref 22–32)
CREATININE: 0.84 mg/dL (ref 0.61–1.24)
Calcium: 8.9 mg/dL (ref 8.9–10.3)
GFR calc non Af Amer: >60 mL/min (ref >60)
Glucose, Bld: 105 mg/dL — ABNORMAL HIGH (ref 65–99)
POTASSIUM: 3.6 mmol/L (ref 3.5–5.1)
SODIUM: 139 mmol/L (ref 135–145)

## 2016-05-27 LAB — CBC
HEMATOCRIT: 42.7 % (ref 39.0–52.0)
HEMOGLOBIN: 14.7 g/dL (ref 13.0–17.0)
MCH: 30.4 pg (ref 26.0–34.0)
MCHC: 34.4 g/dL (ref 30.0–36.0)
MCV: 88.4 fL (ref 78.0–100.0)
PLATELETS: 220 10*3/uL (ref 150–400)
RBC: 4.83 MIL/uL (ref 4.22–5.81)
RDW: 13 % (ref 11.5–15.5)
WBC: 14.7 10*3/uL — AB (ref 4.0–10.5)

## 2016-05-27 MED ORDER — POTASSIUM CHLORIDE 10 MEQ/100ML IV SOLN
10.0000 meq | INTRAVENOUS | Status: AC
  Administered 2016-05-27 (×4): 10 meq via INTRAVENOUS
  Filled 2016-05-27 (×6): qty 100

## 2016-05-27 MED ORDER — LORAZEPAM 2 MG/ML IJ SOLN
1.0000 mg | Freq: Once | INTRAMUSCULAR | Status: AC
  Administered 2016-05-27: 1 mg via INTRAVENOUS
  Filled 2016-05-27: qty 1

## 2016-05-27 NOTE — Progress Notes (Signed)
Patient noted sitting at bedside recliner around 0015 with fiance in room, complain that  he vomited. He was shivering and complaining of abd pain. Denies eating or drinking any thing at the moment. Zofran prn given. Patient becoming restless, afebrile, 100% on RA, bp slightly  elevated. Patient assisted back to bed with two person  assist. While on the phone with NP, Fiance  came out of room saying that patient is choking and have stop breathing at one point. Patient was alert, breathing upon assessment but attempting to vomit. NP made aware that patient and fiance admitted  patient last intake of drug and xanax was Wednesday,  it looks like patient is having withdrawal symptoms and will notify  rapid response nurse.
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
## 2016-05-27 NOTE — Progress Notes (Signed)
Patient tolerated diet all day, denies any N/V.

## 2016-05-27 NOTE — Progress Notes (Signed)
PROGRESS NOTE    Jaime Nixon  EKC:[id number] DOB: July 14, 1996 DOA: 05/25/2016  PCP: Pete Glatter, MD   Brief Narrative:   Jaime Nixon is a 20 y.o. male with medical history significant of seizure disorder (recent diagnosis, 3/17) presenting with recurrent n/v (3rd episode since 3/17).  Failed outpatient phenergan.   Subjective: Currently no nausea but has no appetite and does not want to eat. No abdominal pain. No diarrhea or constipation.   Assessment & Plan:   Principal Problem:  vomiting-  Cannabinoid hyperemesis syndrome ? - normal CT of abdomen and pelvis - symptomatic management- vomiting 2 x yesterday- diet downgraded to liquids - advance diet as tolerated, cont IVF  Active Problems:    Leukocytosis - stress response? -  follow    Seizure disorder - Keppra    Hypokalemia due to loss of potassium - replaced      Tobacco dependence - advised to stop smoking   DVT prophylaxis: Lovenox Code Status: full code Family Communication:  Disposition Plan: home in 1-2 days Consultants:   none Procedures:   none Antimicrobials:  Anti-infectives    None       Objective: Vitals:   05/27/16 0044 05/27/16 0120 05/27/16 0125 05/27/16 0623  BP:  103/72 118/75 (!) 103/56  Pulse: 86 76  86  Resp:    20  Temp: 98.2 F (36.8 C)   98.2 F (36.8 C)  TempSrc: Oral   Oral  SpO2: 100% 100%  100%  Weight:      Height:        Intake/Output Summary (Last 24 hours) at 05/27/16 1325 Last data filed at 05/27/16 0700  Gross per 24 hour  Intake          1926.25 ml  Output                0 ml  Net          1926.25 ml   Filed Weights   05/26/16 0100 05/26/16 0247  Weight: 83.4 kg (183 lb 13.8 oz) 83.6 kg (184 lb 3.2 oz)    Examination: General exam: Appears comfortable  HEENT: PERRLA, oral mucosa moist, no sclera icterus or thrush Respiratory system: Clear to auscultation. Respiratory effort normal. Cardiovascular system: S1 & S2 heard, RRR.  No  murmurs  Gastrointestinal system: Abdomen soft, non-tender, nondistended. Normal bowel sound. No organomegaly Central nervous system: Alert and oriented. No focal neurological deficits. Extremities: No cyanosis, clubbing or edema Skin: No rashes or ulcers Psychiatry:  Mood & affect appropriate.     Data Reviewed: I have personally reviewed following labs and imaging studies  CBC:  Recent Labs Lab 05/23/16 0810 05/25/16 1957 05/26/16 0501 05/27/16 0441  WBC 18.9* 21.0* 13.3* 14.7*  NEUTROABS 15.8* 17.8*  --   --   HGB 15.4 16.1 14.1 14.7  HCT 43.3 45.3 39.0 42.7  MCV 87.3 87.1 87.4 88.4  PLT 229 254 204 220   Basic Metabolic Panel:  Recent Labs Lab 05/23/16 0810 05/25/16 2108 05/26/16 0501 05/26/16 1521 05/27/16 0441  NA 139 141 138 139 139  K 3.6 2.7* 3.7 3.4* 3.6  CL 104 114* 109 108 107  CO2 23 18* 24 26 28   GLUCOSE 121* 116* 127* 103* 105*  BUN 12 21* 14 11 10   CREATININE 0.79 0.77 0.79 0.71 0.84  CALCIUM 9.9 7.5* 8.0* 8.8* 8.9  MG  --   --   --  2.1  --    GFR: Estimated  Creatinine Clearance: 152.7 mL/min (by C-G formula based on SCr of 0.84 mg/dL). Liver Function Tests:  Recent Labs Lab 05/23/16 0810 05/25/16 2108  AST 33 36  ALT 17 24  ALKPHOS 65 54  BILITOT 0.8 0.8  PROT 7.1 6.4*  ALBUMIN 4.4 4.0    Recent Labs Lab 05/23/16 0810 05/25/16 2108  LIPASE 19 18   No results for input(s): AMMONIA in the last 168 hours. Coagulation Profile: No results for input(s): INR, PROTIME in the last 168 hours. Cardiac Enzymes: No results for input(s): CKTOTAL, CKMB, CKMBINDEX, TROPONINI in the last 168 hours. BNP (last 3 results) No results for input(s): PROBNP in the last 8760 hours. HbA1C: No results for input(s): HGBA1C in the last 72 hours. CBG: No results for input(s): GLUCAP in the last 168 hours. Lipid Profile: No results for input(s): CHOL, HDL, LDLCALC, TRIG, CHOLHDL, LDLDIRECT in the last 72 hours. Thyroid Function Tests: No results for  input(s): TSH, T4TOTAL, FREET4, T3FREE, THYROIDAB in the last 72 hours. Anemia Panel: No results for input(s): VITAMINB12, FOLATE, FERRITIN, TIBC, IRON, RETICCTPCT in the last 72 hours. Urine analysis:    Component Value Date/Time   COLORURINE YELLOW 05/25/2016 1937   APPEARANCEUR CLEAR 05/25/2016 1937   LABSPEC 1.024 05/25/2016 1937   PHURINE 7.5 05/25/2016 1937   GLUCOSEU NEGATIVE 05/25/2016 1937   HGBUR NEGATIVE 05/25/2016 1937   BILIRUBINUR NEGATIVE 05/25/2016 1937   KETONESUR 15 (A) 05/25/2016 1937   PROTEINUR NEGATIVE 05/25/2016 1937   UROBILINOGEN 0.2 06/01/2015 0503   NITRITE NEGATIVE 05/25/2016 1937   LEUKOCYTESUR NEGATIVE 05/25/2016 1937   Sepsis Labs: (procalcitonin:4,lacticidven:4) )No results found for this or any previous visit (from the past 240 hour(s)).       Radiology Studies: Ct Abdomen Pelvis W Contrast  Result Date: 05/25/2016 CLINICAL DATA:  Global abd pain for several days with N/V. leucocytosis EXAM: CT ABDOMEN AND PELVIS WITH CONTRAST TECHNIQUE: Multidetector CT imaging of the abdomen and pelvis was performed using the standard protocol following bolus administration of intravenous contrast. CONTRAST:  ISOVUE-300 IOPAMIDOL (ISOVUE-300) INJECTION 61% COMPARISON:  Pelvic CT, 06/01/2015 FINDINGS: Lung bases:  Clear.  Heart normal size. Liver, spleen, gallbladder, pancreas, adrenal glands:  Normal. Kidneys, ureters, bladder:  Normal. Lymph nodes:  No adenopathy. Ascites:  None. Gastrointestinal:  Normal.  Normal appendix visualized. Musculoskeletal:  Unremarkable. IMPRESSION: Normal enhanced CT scan of the abdomen pelvis. Electronically Signed   By: Amie Portland M.D.   On: 05/25/2016 22:59  Dg Abd 2 Views  Result Date: 05/27/2016 CLINICAL DATA:  Acute onset of vomiting early this morning, abdominal pain EXAM: ABDOMEN - 2 VIEW COMPARISON:  Abdominal CT scan of May 25, 2016 FINDINGS: The colonic stool burden is moderate. No free extraluminal gas  collections are observed. There are no abnormal soft tissue calcifications. The bony structures are unremarkable. IMPRESSION: No acute intra-abdominal abnormality. Moderately increase colonic stool burden may reflect constipation in the appropriate clinical setting. Electronically Signed   By: David  [place] M.D.   On: 05/27/2016 07:14     Scheduled Meds: . enoxaparin (LOVENOX) injection  40 mg Subcutaneous Q24H  . levETIRAcetam  500 mg Oral BID  . nicotine  21 mg Transdermal Daily  . potassium chloride  10 mEq Intravenous Q1 Hr x 4   Continuous Infusions: . sodium chloride 125 mL/hr at 05/27/16 0442     LOS: 1 day    Time spent in minutes: 35    Sequoia Witz, MD [facility] Pager: [email or web address] Password [email or web address] 05/27/2016, 1:25  PM

## 2016-05-27 NOTE — Progress Notes (Signed)
Appointment on Weds. 06/29/16 at 0930 AM. Pt is aware and encouraged to keep this appointmtne.

## 2016-05-27 NOTE — Significant Event (Signed)
Rapid Response Event Note  Overview: Time Called: 0110 Event Type: Other (Comment) ("Second Pair of Eyes"  feels pt is starting to go into withdrawls)  Initial Focused Assessment:  Primary RN called regarding that she would like the Rapid Response Nurse to check on her patient due to her thinking pt is going into withdrawals.  On my arrival pt lying in bed curled up in fetal position, no obvious distress noted at present time.  Primary RN met me in the room and advised pt was vomiting and dry heaving and "Finace" who is at the bedside stated that she thought he had stopped breathing at one point.  Pt alert to voice, A&Ox4, stated that he wasn't doing to well, when asked how he was doing.  Upon answering pt moved to the side of the bed and leaned over trash can like he was going to vomit.  Noticed pt was hyperventilating at a rate of approx. 40 per minute.  Pt also complained of head and arms tingling.  Pt already on telemetry, viewed pattern on tele box, NSR rate 72.  02 sat probe applied to finger with reading of 100% on room air, Breath sounds clear bilat.  Noticed when pt responding to questions ventilatory rate more controlled.  When assessing, pt stated that he normally does 2 "Xani-Bars" every day.  Last time he took a "Xani-Bar" was Wednesday prior to admission.  Pt also complaining of mid to lower abd pain.  Noticed CT on the 29th was negative.   Interventions:  Discussed plan of care with Zandra Abts.  I felt that he was starting to withdrawal from Xanax and I recommended Ativan for withdrawal symptoms and a KUB to further assess his abd pain.  Dionne Milo advised she would place the orders.  Ativan '1mg'$  IV given by primary RN.  Stayed in room for approx. 15 minutes after Ativan given.  Pt stated his abd pain was better and his RR was approx 20-24, more relaxed.  Plan of Care (if not transferred):  Advised primary RN that she might need to call Dionne Milo back and see if she can get a PRN order for  Ativan or a scheduled PO order.  I advised her to wait approx 45 minutes to an hour to see how the Ativan would work then call Dionne Milo back to discuss further doses.  I also advised the primary RN to call the Rapid Response team back for any changes in condition.  Event Summary: Name of Physician Notified: Lucina Mellow at [facility]    at    Outcome: Stayed in room and stabalized  Event End Time: [place]  Elio Forget

## 2016-05-27 NOTE — Progress Notes (Addendum)
This RN went to give nighttime dose of Keppra, pt states "I already took a pill." Pt showed this RN home-supply bottle. Pt stated "I usually take 2 pills in the morning so I don't forget. I just wanted to be sure I got the full dose today since you guys are giving me my medications differently." This RN did not administer nighttime dose. Educated patient on medication policy, counted medication with Driscilla Moats, RN and sent home-supply of medication to pharmacy.

## 2016-05-27 NOTE — Progress Notes (Signed)
Patient resting quietly in bed at this time in no acute episode. Call bell within reach.

## 2016-05-28 DIAGNOSIS — R1110 Vomiting, unspecified: Secondary | ICD-10-CM

## 2016-05-28 DIAGNOSIS — R1084 Generalized abdominal pain: Secondary | ICD-10-CM

## 2016-05-28 DIAGNOSIS — E875 Hyperkalemia: Secondary | ICD-10-CM

## 2016-05-28 LAB — BASIC METABOLIC PANEL
ANION GAP: 6 (ref 5–15)
BUN: 10 mg/dL (ref 6–20)
CHLORIDE: 108 mmol/L (ref 101–111)
CO2: 26 mmol/L (ref 22–32)
Calcium: 8.8 mg/dL — ABNORMAL LOW (ref 8.9–10.3)
Creatinine, Ser: 0.84 mg/dL (ref 0.61–1.24)
GFR calc Af Amer: >60 mL/min (ref >60)
GLUCOSE: 98 mg/dL (ref 65–99)
POTASSIUM: 3.5 mmol/L (ref 3.5–5.1)
Sodium: 140 mmol/L (ref 135–145)

## 2016-05-28 LAB — CBC
HEMATOCRIT: 38 % — AB (ref 39.0–52.0)
HEMOGLOBIN: 13.3 g/dL (ref 13.0–17.0)
MCH: 30.9 pg (ref 26.0–34.0)
MCHC: 35 g/dL (ref 30.0–36.0)
MCV: 88.2 fL (ref 78.0–100.0)
Platelets: 193 10*3/uL (ref 150–400)
RBC: 4.31 MIL/uL (ref 4.22–5.81)
RDW: 13.1 % (ref 11.5–15.5)
WBC: 7.6 10*3/uL (ref 4.0–10.5)

## 2016-05-28 MED ORDER — PROMETHAZINE HCL 25 MG PO TABS: 25.0000 mg | ORAL_TABLET | Freq: Four times a day (QID) | ORAL | 0 refills | Status: DC | PRN

## 2016-05-28 MED ORDER — LORAZEPAM 2 MG/ML PO CONC
1.0000 mg | Freq: Once | ORAL | Status: AC
  Administered 2016-05-28: 1 mg via ORAL
  Filled 2016-05-28: qty 1

## 2016-05-28 MED ORDER — HYDROCOD POLST-CPM POLST ER 10-8 MG/5ML PO SUER: 5.0000 mL | Freq: Once | ORAL | Status: DC

## 2016-05-28 MED ORDER — ONDANSETRON HCL 4 MG PO TABS: 4.0000 mg | ORAL_TABLET | Freq: Four times a day (QID) | ORAL | 0 refills | Status: DC | PRN

## 2016-05-28 MED ORDER — ONDANSETRON 4 MG PO TBDP
8.0000 mg | ORAL_TABLET | Freq: Once | ORAL | Status: AC
  Administered 2016-05-28: 8 mg via ORAL
  Filled 2016-05-28: qty 2

## 2016-05-28 MED ORDER — MENTHOL 3 MG MT LOZG: 1.0000 | LOZENGE | OROMUCOSAL | Status: DC | PRN

## 2016-05-28 NOTE — Progress Notes (Signed)
Pt complaining of gas like abdominal pain.  RN suggested pt walk and try to have a BM.  Pt disappeared from the floor after taking off telemetry and removing IV. Reappeared after 15 minutes, and stated that he had gone to the parking lot to smoke a cigarette.  He was holding abdomen, crying, pacing stating that it hurt so bad.  He vomited small amount yellow fluid at that time.  MD aware and at bedside.

## 2016-06-04 MED FILL — levETIRAcetam 500 MG TABS: 500 | 30 days supply | Qty: 60 | Fill #3

## 2016-06-20 ENCOUNTER — Emergency Department (HOSPITAL_COMMUNITY): Payer: Self-pay

## 2016-06-20 ENCOUNTER — Emergency Department (HOSPITAL_COMMUNITY)
Admission: EM | Admit: 2016-06-20 | Discharge: 2016-06-20 | Disposition: A | Discharge status: Home / Self Care | Payer: Self-pay | Attending: Emergency Medicine | Admitting: Emergency Medicine

## 2016-06-20 ENCOUNTER — Encounter (HOSPITAL_COMMUNITY): Payer: Self-pay | Admitting: Emergency Medicine

## 2016-06-20 DIAGNOSIS — F17210 Nicotine dependence, cigarettes, uncomplicated: Secondary | ICD-10-CM | POA: Insufficient documentation

## 2016-06-20 DIAGNOSIS — Y9301 Activity, walking, marching and hiking: Secondary | ICD-10-CM | POA: Insufficient documentation

## 2016-06-20 DIAGNOSIS — Y929 Unspecified place or not applicable: Secondary | ICD-10-CM | POA: Insufficient documentation

## 2016-06-20 DIAGNOSIS — S060X0A Concussion without loss of consciousness, initial encounter: Secondary | ICD-10-CM | POA: Insufficient documentation

## 2016-06-20 DIAGNOSIS — Y999 Unspecified external cause status: Secondary | ICD-10-CM | POA: Insufficient documentation

## 2016-06-20 DIAGNOSIS — W01198A Fall on same level from slipping, tripping and stumbling with subsequent striking against other object, initial encounter: Secondary | ICD-10-CM | POA: Insufficient documentation

## 2016-06-20 LAB — CBC WITH DIFFERENTIAL/PLATELET
Basophils Absolute: 0 10*3/uL (ref 0.0–0.1)
Basophils Relative: 0 %
Eosinophils Absolute: 0.3 10*3/uL (ref 0.0–0.7)
Eosinophils Relative: 3 %
HCT: 41.7 % (ref 39.0–52.0)
Hemoglobin: 14.4 g/dL (ref 13.0–17.0)
Lymphocytes Relative: 24 %
Lymphs Abs: 2.9 10*3/uL (ref 0.7–4.0)
MCH: 31 pg (ref 26.0–34.0)
MCHC: 34.5 g/dL (ref 30.0–36.0)
MCV: 89.9 fL (ref 78.0–100.0)
Monocytes Absolute: 1.1 10*3/uL — ABNORMAL HIGH (ref 0.1–1.0)
Monocytes Relative: 9 %
Neutro Abs: 7.7 10*3/uL (ref 1.7–7.7)
Neutrophils Relative %: 64 %
Platelets: 208 10*3/uL (ref 150–400)
RBC: 4.64 MIL/uL (ref 4.22–5.81)
RDW: 13.3 % (ref 11.5–15.5)
WBC: 12.1 10*3/uL — ABNORMAL HIGH (ref 4.0–10.5)

## 2016-06-20 LAB — BASIC METABOLIC PANEL
Anion gap: 6 (ref 5–15)
BUN: 17 mg/dL (ref 6–20)
CO2: 24 mmol/L (ref 22–32)
Calcium: 9.2 mg/dL (ref 8.9–10.3)
Chloride: 107 mmol/L (ref 101–111)
Creatinine, Ser: 0.82 mg/dL (ref 0.61–1.24)
GFR calc Af Amer: >60 mL/min (ref >60)
GFR calc non Af Amer: >60 mL/min (ref >60)
Glucose, Bld: 108 mg/dL — ABNORMAL HIGH (ref 65–99)
Potassium: 4.3 mmol/L (ref 3.5–5.1)
Sodium: 137 mmol/L (ref 135–145)

## 2016-06-20 MED ORDER — KETOROLAC TROMETHAMINE 15 MG/ML IJ SOLN
15.0000 mg | Freq: Once | INTRAMUSCULAR | Status: AC
  Administered 2016-06-20: 15 mg via INTRAVENOUS
  Filled 2016-06-20: qty 1

## 2016-06-20 MED ORDER — SODIUM CHLORIDE 0.9 % IV BOLUS (SEPSIS)
1000.0000 mL | Freq: Once | INTRAVENOUS | Status: AC
  Administered 2016-06-20: 1000 mL via INTRAVENOUS

## 2016-06-20 NOTE — Discharge Instructions (Signed)
Please rest drink plenty fluids, follow-up with primary care provider in 3 days for reevaluation. Please return to the emergency room immediately if you experience any new or worsening signs or symptoms

## 2016-06-20 NOTE — ED Provider Notes (Signed)
[facility]-EMERGENCY DEPT Provider Note   CSN: [id number] Arrival date & time: 06/20/16  [id number]     History   Chief Complaint Chief Complaint  Patient presents with  . Blurred Vision  . Fall    HPI Jaime Nixon is a 20 y.o. male.  HPI   20 year old male presents today with complaints of hitting his head. Patient reports yesterday he was walking and stumbled and fell hitting his head on the concrete. Patient reports pain at his right upper shoulder extending up into the neck to the base of the skull. Patient also reports pain to the right side of his head. Patient notes since then he's had a small amount of dizziness upon standing, and complaints that "lights and colors or brighter than normal". Significant other notes that he has had episodes of minor confusion. Patient reports that he smokes marijuana, denies any other drugs or alcohol. Patient notes a history of seizure, taking Keppra for this. Patient denies any decreased range of motion of the neck, denies any fever or chills, nausea vomiting, chest pain shortness of breath, abdominal pain, palpitations, rash. Patient reports he's eating and drinking well. He reports the dizziness is worse with standing, and sinusitis head movements. None at baseline.   Past Medical History:  Diagnosis Date  . Current smoker   . History of substance use   . Seizures [facility](HCC)     Patient Active Problem List   Diagnosis Date Noted  . Generalized abdominal pain   . Hyperkalemia   . Uncontrollable vomiting   . Seizure disorder (HCC) 05/26/2016  . Cannabinoid hyperemesis syndrome (HCC) 05/26/2016  . Hypokalemia due to loss of potassium 05/26/2016  . Tobacco dependence 05/26/2016  . Nausea and vomiting 05/26/2016  . Leukocytosis 12/27/2015  . Abnormal finding on MRI of brain   . Pachymeningitis     History reviewed. No pertinent surgical history.     Home Medications    Prior to Admission medications   Medication Sig Start Date End Date  Taking? Authorizing Provider  levETIRAcetam (KEPPRA) 500 MG tablet Take 1 tablet (500 mg total) by mouth 2 (two) times daily. 04/18/16  Yes Roxy Horsemanobert Browning, PA-C  ondansetron (ZOFRAN) 4 MG tablet Take 1 tablet (4 mg total) by mouth every 6 (six) hours as needed for nausea. Patient not taking: Reported on 06/20/2016 05/28/16   Calvert CantorSaima Rizwan, MD  promethazine (PHENERGAN) 25 MG tablet Take 1 tablet (25 mg total) by mouth every 6 (six) hours as needed for nausea or vomiting. Patient not taking: Reported on 06/20/2016 05/28/16   Calvert CantorSaima Rizwan, MD    Family History Family History  Problem Relation Age of Onset  . Seizures Father   . Seizures Brother     Social History Social History  Substance Use Topics  . Smoking status: Current Every Day Smoker    Packs/day: 1.00    Years: 10.00    Types: Cigarettes  . Smokeless tobacco: Never Used  . Alcohol use Yes     Comment: rare use, but binges occasionally     Allergies   Dexamethasone and Prednisone   Review of Systems Review of Systems  All other systems reviewed and are negative.  Physical Exam Updated Vital Signs BP 104/73   Pulse (!) 58   Temp 97.5 F (36.4 C) (Oral)   Resp 10   Ht 6\' 1"  (1.854 m)   Wt 81.6 kg   SpO2 99%   BMI 23.75 kg/m   Physical Exam  Constitutional: He  is oriented to person, place, and time. He appears well-developed and well-nourished.  HENT:  Head: Normocephalic and atraumatic.  Eyes: Conjunctivae are normal. Pupils are equal, round, and reactive to light. Right eye exhibits no discharge. Left eye exhibits no discharge. No scleral icterus.  Neck: Normal range of motion. No JVD present. No tracheal deviation present.  Pulmonary/Chest: Effort normal. No stridor.  Neurological: He is alert and oriented to person, place, and time. He has normal strength. No cranial nerve deficit or sensory deficit. Coordination normal. GCS eye subscore is 4. GCS verbal subscore is 5. GCS motor subscore is 6.  Reflex  Scores:      Patellar reflexes are 2+ on the right side and 2+ on the left side. Normal finger to nose, visual fields intact  Psychiatric: He has a normal mood and affect. His behavior is normal. Judgment and thought content normal.  Nursing note and vitals reviewed.    ED Treatments / Results  Labs (all labs ordered are listed, but only abnormal results are displayed) Labs Reviewed  CBC WITH DIFFERENTIAL/PLATELET - Abnormal; Notable for the following:       Result Value   WBC 12.1 (*)    Monocytes Absolute 1.1 (*)    All other components within normal limits  BASIC METABOLIC PANEL - Abnormal; Notable for the following:    Glucose, Bld 108 (*)    All other components within normal limits    EKG  EKG Interpretation None       Radiology Ct Head Wo Contrast  Result Date: 06/20/2016 CLINICAL DATA:  Pain and blurred vision following fall EXAM: CT HEAD WITHOUT CONTRAST CT CERVICAL SPINE WITHOUT CONTRAST TECHNIQUE: Multidetector CT imaging of the head and cervical spine was performed following the standard protocol without intravenous contrast. Multiplanar CT image reconstructions of the cervical spine were also generated. COMPARISON:  Head CT April 18, 2016 FINDINGS: CT HEAD FINDINGS The ventricles are normal in size and configuration. There is no intracranial mass, hemorrhage, extra-axial fluid collection, or midline shift. Gray-white compartments appear normal. No acute infarct evident. There is no hyperdense vessel or vascular calcification evident. The bony calvarium appears intact. The mastoid air cells are clear. There is opacification in multiple ethmoid air cells bilaterally. Orbits appear symmetric bilaterally. CT CERVICAL SPINE FINDINGS There is no fracture or spondylolisthesis. Prevertebral soft tissues and predental space regions are normal. The disc spaces appear normal. There is no nerve root edema or effacement. No disc extrusion or stenosis. IMPRESSION: CT head: Ethmoid sinus  disease bilaterally. No intracranial mass, hemorrhage, or extra-axial fluid collection. Gray-white compartments appear normal. CT cervical spine: No fracture or spondylolisthesis. No appreciable arthropathic change. Electronically Signed   By: Bretta Bang III M.D.   On: 06/20/2016 08:03   Ct Cervical Spine Wo Contrast  Result Date: 06/20/2016 CLINICAL DATA:  Pain and blurred vision following fall EXAM: CT HEAD WITHOUT CONTRAST CT CERVICAL SPINE WITHOUT CONTRAST TECHNIQUE: Multidetector CT imaging of the head and cervical spine was performed following the standard protocol without intravenous contrast. Multiplanar CT image reconstructions of the cervical spine were also generated. COMPARISON:  Head CT April 18, 2016 FINDINGS: CT HEAD FINDINGS The ventricles are normal in size and configuration. There is no intracranial mass, hemorrhage, extra-axial fluid collection, or midline shift. Gray-white compartments appear normal. No acute infarct evident. There is no hyperdense vessel or vascular calcification evident. The bony calvarium appears intact. The mastoid air cells are clear. There is opacification in multiple ethmoid air cells bilaterally.  Orbits appear symmetric bilaterally. CT CERVICAL SPINE FINDINGS There is no fracture or spondylolisthesis. Prevertebral soft tissues and predental space regions are normal. The disc spaces appear normal. There is no nerve root edema or effacement. No disc extrusion or stenosis. IMPRESSION: CT head: Ethmoid sinus disease bilaterally. No intracranial mass, hemorrhage, or extra-axial fluid collection. Gray-white compartments appear normal. CT cervical spine: No fracture or spondylolisthesis. No appreciable arthropathic change. Electronically Signed   By: Bretta BangWilliam  Woodruff III M.D.   On: 06/20/2016 08:03    Procedures Procedures (including critical care time)  Medications Ordered in ED Medications  sodium chloride 0.9 % bolus 1,000 mL (0 mLs Intravenous Stopped  06/20/16 0927)  ketorolac (TORADOL) 15 MG/ML injection 15 mg (15 mg Intravenous Given 06/20/16 0721)     Initial Impression / Assessment and Plan / ED Course  I have reviewed the triage vital signs and the nursing notes.  Pertinent labs & imaging results that were available during my care of the patient were reviewed by me and considered in my medical decision making (see chart for details).  Clinical Course     Labs:  Imaging:  Consults:  Therapeutics:  Discharge Meds:   Assessment/Plan:  Well-appearing male in no obvious distress. Patient notes a fall with head trauma last night. No obvious trauma on exam.  Concern for concussion, significant other reports some confusion. Patient has no confusion, and a completely normal neurological exam. CT head normal, CT neck normal, no significant findings on laboratory analysis. Patient has no signs of meningitis, no signs of stroke or TIA, no other need for further evaluation and management here in the ED setting. He'll be discharged home with symptomatic care instructions and strict return precautions. He verbalized understanding and agreement to today's plan had no further questions or concerns      Final Clinical Impressions(s) / ED Diagnoses   Final diagnoses:  Concussion, without loss of consciousness, initial encounter    New Prescriptions New Prescriptions   No medications on file     Eyvonne MechanicJeffrey Yukio Bisping, PA-C 06/20/16 0940    Gilda Creasehristopher J Pollina, MD 06/21/16 [phone]

## 2016-06-20 NOTE — ED Notes (Signed)
D/C instructions reviewed and patient verbalizes understanding of follow up care

## 2016-06-20 NOTE — ED Triage Notes (Signed)
Pt reports falling at home last night and waking up this morning with head and neck pain as well as blurry vision.

## 2016-07-03 ENCOUNTER — Ambulatory Visit: Payer: Self-pay | Admitting: Family Medicine

## 2016-07-10 MED FILL — ?LEVETIRACETAM 500 MG TABLE: 500 | 30 days supply | Qty: 60 | Fill #4

## 2016-09-10 ENCOUNTER — Inpatient Hospital Stay (HOSPITAL_COMMUNITY)
Admission: EM | Admit: 2016-09-10 | Discharge: 2016-09-18 | DRG: 918 | Disposition: A | Discharge status: Other Acute Inpatient Hospital | Payer: Federal, State, Local not specified - Other | Attending: Internal Medicine | Admitting: Internal Medicine

## 2016-09-10 ENCOUNTER — Encounter (HOSPITAL_COMMUNITY): Payer: Self-pay | Admitting: Emergency Medicine

## 2016-09-10 DIAGNOSIS — F410 Panic disorder [episodic paroxysmal anxiety] without agoraphobia: Secondary | ICD-10-CM

## 2016-09-10 DIAGNOSIS — F1210 Cannabis abuse, uncomplicated: Secondary | ICD-10-CM | POA: Diagnosis present

## 2016-09-10 DIAGNOSIS — Z8679 Personal history of other diseases of the circulatory system: Secondary | ICD-10-CM

## 2016-09-10 DIAGNOSIS — T50901A Poisoning by unspecified drugs, medicaments and biological substances, accidental (unintentional), initial encounter: Secondary | ICD-10-CM | POA: Diagnosis present

## 2016-09-10 DIAGNOSIS — R001 Bradycardia, unspecified: Secondary | ICD-10-CM | POA: Diagnosis present

## 2016-09-10 DIAGNOSIS — F329 Major depressive disorder, single episode, unspecified: Secondary | ICD-10-CM | POA: Diagnosis present

## 2016-09-10 DIAGNOSIS — F17210 Nicotine dependence, cigarettes, uncomplicated: Secondary | ICD-10-CM | POA: Diagnosis present

## 2016-09-10 DIAGNOSIS — T450X2A Poisoning by antiallergic and antiemetic drugs, intentional self-harm, initial encounter: Secondary | ICD-10-CM | POA: Diagnosis present

## 2016-09-10 DIAGNOSIS — Z653 Problems related to other legal circumstances: Secondary | ICD-10-CM

## 2016-09-10 DIAGNOSIS — G40909 Epilepsy, unspecified, not intractable, without status epilepticus: Secondary | ICD-10-CM | POA: Diagnosis not present

## 2016-09-10 DIAGNOSIS — Z888 Allergy status to other drugs, medicaments and biological substances status: Secondary | ICD-10-CM

## 2016-09-10 DIAGNOSIS — T424X2A Poisoning by benzodiazepines, intentional self-harm, initial encounter: Secondary | ICD-10-CM | POA: Diagnosis present

## 2016-09-10 DIAGNOSIS — T426X2A Poisoning by other antiepileptic and sedative-hypnotic drugs, intentional self-harm, initial encounter: Principal | ICD-10-CM | POA: Diagnosis present

## 2016-09-10 DIAGNOSIS — I952 Hypotension due to drugs: Secondary | ICD-10-CM | POA: Diagnosis present

## 2016-09-10 DIAGNOSIS — Z79899 Other long term (current) drug therapy: Secondary | ICD-10-CM

## 2016-09-10 DIAGNOSIS — T50902A Poisoning by unspecified drugs, medicaments and biological substances, intentional self-harm, initial encounter: Secondary | ICD-10-CM | POA: Diagnosis not present

## 2016-09-10 DIAGNOSIS — R5383 Other fatigue: Secondary | ICD-10-CM | POA: Diagnosis present

## 2016-09-10 DIAGNOSIS — T50905A Adverse effect of unspecified drugs, medicaments and biological substances, initial encounter: Secondary | ICD-10-CM

## 2016-09-10 LAB — COMPREHENSIVE METABOLIC PANEL
ALT: 10 U/L — AB (ref 17–63)
AST: 27 U/L (ref 15–41)
Albumin: 4.6 g/dL (ref 3.5–5.0)
Alkaline Phosphatase: 56 U/L (ref 38–126)
Anion gap: 7 (ref 5–15)
BUN: 17 mg/dL (ref 6–20)
CHLORIDE: 106 mmol/L (ref 101–111)
CO2: 24 mmol/L (ref 22–32)
CREATININE: 0.86 mg/dL (ref 0.61–1.24)
Calcium: 9.6 mg/dL (ref 8.9–10.3)
Glucose, Bld: 104 mg/dL — ABNORMAL HIGH (ref 65–99)
Potassium: 4.7 mmol/L (ref 3.5–5.1)
Sodium: 137 mmol/L (ref 135–145)
TOTAL PROTEIN: 7.3 g/dL (ref 6.5–8.1)
Total Bilirubin: 1.6 mg/dL — ABNORMAL HIGH (ref 0.3–1.2)

## 2016-09-10 LAB — CBC WITH DIFFERENTIAL/PLATELET
Basophils Absolute: 0 10*3/uL (ref 0.0–0.1)
Basophils Relative: 0 %
EOS PCT: 1 %
Eosinophils Absolute: 0.1 10*3/uL (ref 0.0–0.7)
HCT: 44 % (ref 39.0–52.0)
Hemoglobin: 15.3 g/dL (ref 13.0–17.0)
LYMPHS ABS: 1.9 10*3/uL (ref 0.7–4.0)
LYMPHS PCT: 20 %
MCH: 31.2 pg (ref 26.0–34.0)
MCHC: 34.8 g/dL (ref 30.0–36.0)
MCV: 89.8 fL (ref 78.0–100.0)
MONO ABS: 0.7 10*3/uL (ref 0.1–1.0)
Monocytes Relative: 7 %
Neutro Abs: 6.8 10*3/uL (ref 1.7–7.7)
Neutrophils Relative %: 72 %
PLATELETS: 215 10*3/uL (ref 150–400)
RBC: 4.9 MIL/uL (ref 4.22–5.81)
RDW: 13.1 % (ref 11.5–15.5)
WBC: 9.4 10*3/uL (ref 4.0–10.5)

## 2016-09-10 LAB — RAPID URINE DRUG SCREEN, HOSP PERFORMED
Amphetamines: NOT DETECTED
BENZODIAZEPINES: POSITIVE — AB
Barbiturates: NOT DETECTED
Cocaine: NOT DETECTED
Opiates: NOT DETECTED
Tetrahydrocannabinol: POSITIVE — AB

## 2016-09-10 LAB — SALICYLATE LEVEL

## 2016-09-10 LAB — ETHANOL

## 2016-09-10 LAB — ACETAMINOPHEN LEVEL

## 2016-09-10 MED ORDER — ATROPINE SULFATE 1 MG/10ML IJ SOSY
0.5000 mg | PREFILLED_SYRINGE | Freq: Once | INTRAMUSCULAR | Status: AC
  Administered 2016-09-10: 0.5 mg via INTRAVENOUS
  Filled 2016-09-10: qty 10

## 2016-09-10 MED ORDER — ENOXAPARIN SODIUM 40 MG/0.4ML ~~LOC~~ SOLN
40.0000 mg | SUBCUTANEOUS | Status: DC
  Administered 2016-09-11 – 2016-09-14 (×2): 40 mg via SUBCUTANEOUS
  Filled 2016-09-10 (×5): qty 0.4

## 2016-09-10 MED ORDER — ATROPINE SULFATE 1 MG/10ML IJ SOSY
1.0000 mg | PREFILLED_SYRINGE | Freq: Once | INTRAMUSCULAR | Status: AC
  Administered 2016-09-10: 1 mg via INTRAVENOUS
  Filled 2016-09-10: qty 10

## 2016-09-10 MED ORDER — SODIUM CHLORIDE 0.9 % IV SOLN
INTRAVENOUS | Status: DC
  Administered 2016-09-10 – 2016-09-12 (×4): via INTRAVENOUS

## 2016-09-10 MED ORDER — SODIUM CHLORIDE 0.9 % IV BOLUS (SEPSIS)
1000.0000 mL | Freq: Once | INTRAVENOUS | Status: AC
  Administered 2016-09-10: 1000 mL via INTRAVENOUS

## 2016-09-10 NOTE — ED Provider Notes (Addendum)
Clinical Course as of Sep 10 1948  Tue Sep 10, 2016  1756 Heart rate in the 50s.  BP in the low 90s.  I think he will need a longer period of observation.   [JK]    Clinical Course User Index [JK] Linwood DibblesJon Keevan Wolz, MD   Pt continues to remain alert.  No complaints. Repeat BP now is in the 80s.  I will give him 0.5 mg atropine.  Clinically he is asymptomatic.  I think he may benefit from admission, overnight observation considering his overdose and abnormal vital signs.     Linwood DibblesJon Kierrah Kilbride, MD 09/10/16 1952  I discussed findings with patient.  He now admits he might have taken some of his grandmothers medications.  She does take BP meds.  I suspect this is causing his bardycardia and low BP.  Last is in the 90s.     Linwood DibblesJon Kenlea Woodell, MD 09/10/16 2025  Updated poison center.  No new recommendations at this time.  Agree with atropine.  If sx worsen, start pressors. Continue to monitor closely.  Repeat atropine ordered  .Critical Care Performed by: Linwood DibblesKNAPP, Gracyn Allor Authorized by: Linwood DibblesKNAPP, Lendora Keys   Critical care provider statement:    Critical care time (minutes):  45   Critical care was time spent personally by me on the following activities:  Discussions with consultants, evaluation of patient's response to treatment, examination of patient, ordering and performing treatments and interventions, ordering and review of laboratory studies, ordering and review of radiographic studies, pulse oximetry, re-evaluation of patient's condition, obtaining history from patient or surrogate and review of old charts       Linwood DibblesJon Betzy Barbier, MD 09/10/16 2046

## 2016-09-10 NOTE — Progress Notes (Signed)
Pt resting with eyes closed  2 Male visitors at bedside who confirmed with Cm that pt "generally does not go to the doctor but suppose to go to [facility] off [address]"

## 2016-09-10 NOTE — ED Notes (Signed)
Spoke with poison control. Requested cardiac monitoring for pt, and tylenol level. Stated to not administer charcoal due to aspiration risk. State to use benzos if pt seizes. Monitor pt for CNS depression, bradycardia and hypotension. State to monitor pt for 6 hours and do a repeat EKG.

## 2016-09-10 NOTE — ED Triage Notes (Signed)
Per EMS pt overdosed on keppra, xanax and benadryl. States he took 2 handfuls. Unsure of when he took it. Admitted to [facility]. EMS states pt texted friend after ingesting. Denies any ETOH on board.

## 2016-09-10 NOTE — ED Notes (Signed)
Condom  catheter  Has been placed. Waiting for a urine sample

## 2016-09-10 NOTE — H&P (Addendum)
History and Physical    Jaime Keenshomas J Nuno ZOX:[id number] DOB: 1996-09-15 DOA: 09/10/2016   PCP: Pete Glatterawn T Langeland, MD Chief Complaint:  Chief Complaint  Patient presents with  . Drug Overdose    HPI: Jaime Nixon is a 20 y.o. male with medical history significant of substance abuse and seizure disorder.  He presents via EMS with OD.  He texted his girlfriend and said he was going to kill himself by taking large amounts of keppra, benadryl, and xanax.  Patient also admits to taking unknown medication of his grandmothers.  Grandma is on BP pills.  Initially lethargic on arrival.  ED Course: Patient is more alert since arrival; however, BP has dropped to the 70s and he became bradycardic to the 40s.  Has been given IVF, and 2 rounds of atropine thus far with improvement in HR and BP.  Review of Systems: As per HPI otherwise 10 point review of systems negative.    Past Medical History:  Diagnosis Date  . Current smoker   . History of substance use   . Seizures (HCC)     History reviewed. No pertinent surgical history.   reports that he has been smoking Cigarettes.  He has a 10.00 pack-year smoking history. He has never used smokeless tobacco. He reports that he drinks alcohol. He reports that he uses drugs, including Marijuana.  Allergies  Allergen Reactions  . Dexamethasone Shortness Of Breath and Other (See Comments)    Reaction:  Makes pt angry   . Prednisone Other (See Comments)    Reaction:  Makes pt angry     Family History  Problem Relation Age of Onset  . Seizures Father   . Seizures Brother       Prior to Admission medications   Medication Sig Start Date End Date Taking? Authorizing Provider  levETIRAcetam (KEPPRA) 500 MG tablet Take 1 tablet (500 mg total) by mouth 2 (two) times daily. 04/18/16  Yes Roxy Horsemanobert Browning, PA-C    Physical Exam: Vitals:   09/10/16 1907 09/10/16 1942 09/10/16 2000 09/10/16 2019  BP: (!) 98/54 (!) 81/40 (!) 78/59 90/60  Pulse: (!)  58 (!) 53 (!) 59 (!) 50  Resp: 19 15 16 16   Temp:      SpO2: 98% 100% 100% 100%      Constitutional: NAD, calm, comfortable Eyes: PERRL, lids and conjunctivae normal ENMT: Mucous membranes are moist. Posterior pharynx clear of any exudate or lesions.Normal dentition.  Neck: normal, supple, no masses, no thyromegaly Respiratory: clear to auscultation bilaterally, no wheezing, no crackles. Normal respiratory effort. No accessory muscle use.  Cardiovascular: Regular rate and rhythm, no murmurs / rubs / gallops. No extremity edema. 2+ pedal pulses. No carotid bruits.  Abdomen: no tenderness, no masses palpated. No hepatosplenomegaly. Bowel sounds positive.  Musculoskeletal: no clubbing / cyanosis. No joint deformity upper and lower extremities. Good ROM, no contractures. Normal muscle tone.  Skin: no rashes, lesions, ulcers. No induration Neurologic: CN 2-12 grossly intact. Sensation intact, DTR normal. Strength 5/5 in all 4.  Psychiatric: Normal judgment and insight. Alert and oriented x 3. Normal mood.    Labs on Admission: I have personally reviewed following labs and imaging studies  CBC:  Recent Labs Lab 09/10/16 1120  WBC 9.4  NEUTROABS 6.8  HGB 15.3  HCT 44.0  MCV 89.8  PLT 215   Basic Metabolic Panel:  Recent Labs Lab 09/10/16 1120  NA 137  K 4.7  CL 106  CO2 24  GLUCOSE 104*  BUN 17  CREATININE 0.86  CALCIUM 9.6   GFR: CrCl cannot be calculated (Unknown ideal weight.). Liver Function Tests:  Recent Labs Lab 09/10/16 1120  AST 27  ALT 10*  ALKPHOS 56  BILITOT 1.6*  PROT 7.3  ALBUMIN 4.6   No results for input(s): LIPASE, AMYLASE in the last 168 hours. No results for input(s): AMMONIA in the last 168 hours. Coagulation Profile: No results for input(s): INR, PROTIME in the last 168 hours. Cardiac Enzymes: No results for input(s): CKTOTAL, CKMB, CKMBINDEX, TROPONINI in the last 168 hours. BNP (last 3 results) No results for input(s): PROBNP in  the last 8760 hours. HbA1C: No results for input(s): HGBA1C in the last 72 hours. CBG: No results for input(s): GLUCAP in the last 168 hours. Lipid Profile: No results for input(s): CHOL, HDL, LDLCALC, TRIG, CHOLHDL, LDLDIRECT in the last 72 hours. Thyroid Function Tests: No results for input(s): TSH, T4TOTAL, FREET4, T3FREE, THYROIDAB in the last 72 hours. Anemia Panel: No results for input(s): VITAMINB12, FOLATE, FERRITIN, TIBC, IRON, RETICCTPCT in the last 72 hours. Urine analysis:    Component Value Date/Time   COLORURINE YELLOW 05/25/2016 1937   APPEARANCEUR CLEAR 05/25/2016 1937   LABSPEC 1.024 05/25/2016 1937   PHURINE 7.5 05/25/2016 1937   GLUCOSEU NEGATIVE 05/25/2016 1937   HGBUR NEGATIVE 05/25/2016 1937   BILIRUBINUR NEGATIVE 05/25/2016 1937   KETONESUR 15 (A) 05/25/2016 1937   PROTEINUR NEGATIVE 05/25/2016 1937   UROBILINOGEN 0.2 06/01/2015 0503   NITRITE NEGATIVE 05/25/2016 1937   LEUKOCYTESUR NEGATIVE 05/25/2016 1937   Sepsis Labs: @LABRCNTIP (procalcitonin:4,lacticidven:4) )No results found for this or any previous visit (from the past 240 hour(s)).   Radiological Exams on Admission: No results found.  EKG: Independently reviewed.  Assessment/Plan Principal Problem:   OD (overdose of drug), intentional self-harm, initial encounter [facility](HCC) Active Problems:   Seizure disorder (HCC)   Bradycardia, drug induced   Hypotension due to drugs   Overdose    1. OD - with bradycardia and hypotension 1. Trying to find out what exactly grandma is taking, suspect a beta blocker or CCB given bradycardia / hypotension 2. Atropine PRN for now since this is working.  If HR drop and BP drop become persistent then would go to atropine gtt vs pressors. 3. Tele monitor 4. Admit to SDU 5. Hold all meds for now 6. Suicide precautions 2. Seizure disorder - holding keppra for the moment given overdose   DVT prophylaxis: Lovenox Code Status: Full Family Communication: No  family in room Consults called: TTS Admission status: Admit to inpatient, patient will need to be inpatient status given requirement for repeated atropine injections for hemodynamic instability   Hillary BowGARDNER, Maanav Kassabian M. DO [facility] Pager [phone] from 7PM-7AM  If 7AM-7PM, please contact the day physician for the patient [email or web address] Password [email or web address]  09/10/2016, 8:47 PM

## 2016-09-10 NOTE — ED Notes (Signed)
Pt given sprite to drink. 

## 2016-09-10 NOTE — ED Notes (Signed)
Bed: RESB Expected date:  Expected time:  Means of arrival:  Comments: EMS-OD 

## 2016-09-10 NOTE — ED Notes (Signed)
TTS states switching shifts and oncoming shift will evaluate patient.

## 2016-09-10 NOTE — ED Notes (Signed)
Bed: WA15 Expected date:  Expected time:  Means of arrival:  Comments: RES B 

## 2016-09-10 NOTE — ED Notes (Signed)
MD made aware of patient vital signs.

## 2016-09-10 NOTE — ED Notes (Signed)
TTS at bedside. 

## 2016-09-10 NOTE — Significant Event (Addendum)
Per grandmother, only meds he could have gotten into are lisinopril, HCTZ, and a "sugar pill".  (no beta blockers or CCBs).  Putting on NS at 125 cc/hr.  Repeat BMP in AM.

## 2016-09-10 NOTE — ED Notes (Signed)
Spoke with patients grandmother. States she has lisinopril 40mg  and HCTZ 25 mg at home that patient could have ingested. Unsure if any pills are missing. [phone]

## 2016-09-10 NOTE — ED Notes (Signed)
Did not have to bladder scan.

## 2016-09-10 NOTE — ED Provider Notes (Addendum)
[facility]-EMERGENCY DEPT Provider Note   CSN: [id number] Arrival date & time: 09/10/16  1108     History   Chief Complaint Chief Complaint  Patient presents with  . Drug Overdose    HPI Jaime Nixon is a 20 y.o. male.  HPI 20 year old male with past medical history of substance abuse and seizure disorder who presents with intentional overdose. Per report from EMS, patient notified his girlfriend via text that he had committed suicide by taking a large amount of Keppra, Benadryl, and Xanax. Police were called and picked him up. EMS was called and he was brought to the ED. According to EMS, patient has been drowsy but arousable en route. Vital signs have been stable. Patient did endorse suicidal ideation to EMS as well as on interview. He states that he feels "out of it" and is unable or unwilling to provide further elaboration. Denies any other self-harm.  Level 5 caveat invoked as remainder of history, ROS, and physical exam limited due to patient's drowsiness, intoxication.   Past Medical History:  Diagnosis Date  . Current smoker   . History of substance use   . Seizures [facility](HCC)     Patient Active Problem List   Diagnosis Date Noted  . Generalized abdominal pain   . Hyperkalemia   . Uncontrollable vomiting   . Seizure disorder (HCC) 05/26/2016  . Cannabinoid hyperemesis syndrome (HCC) 05/26/2016  . Hypokalemia due to loss of potassium 05/26/2016  . Tobacco dependence 05/26/2016  . Nausea and vomiting 05/26/2016  . Leukocytosis 12/27/2015  . Abnormal finding on MRI of brain   . Pachymeningitis     History reviewed. No pertinent surgical history.     Home Medications    Prior to Admission medications   Medication Sig Start Date End Date Taking? Authorizing Provider  levETIRAcetam (KEPPRA) 500 MG tablet Take 1 tablet (500 mg total) by mouth 2 (two) times daily. 04/18/16  Yes Roxy Horsemanobert Browning, PA-C  ondansetron (ZOFRAN) 4 MG tablet Take 1 tablet (4 mg total) by mouth  every 6 (six) hours as needed for nausea. Patient not taking: Reported on 09/10/2016 05/28/16   Calvert CantorSaima Rizwan, MD  promethazine (PHENERGAN) 25 MG tablet Take 1 tablet (25 mg total) by mouth every 6 (six) hours as needed for nausea or vomiting. Patient not taking: Reported on 09/10/2016 05/28/16   Calvert CantorSaima Rizwan, MD    Family History Family History  Problem Relation Age of Onset  . Seizures Father   . Seizures Brother     Social History Social History  Substance Use Topics  . Smoking status: Current Every Day Smoker    Packs/day: 1.00    Years: 10.00    Types: Cigarettes  . Smokeless tobacco: Never Used  . Alcohol use Yes     Comment: rare use, but binges occasionally     Allergies   Dexamethasone and Prednisone   Review of Systems Review of Systems  Constitutional: Positive for fatigue. Negative for chills and fever.  HENT: Negative for congestion and rhinorrhea.   Eyes: Negative for visual disturbance.  Respiratory: Negative for cough, shortness of breath and wheezing.   Cardiovascular: Negative for chest pain and leg swelling.  Gastrointestinal: Negative for abdominal pain, diarrhea, nausea and vomiting.  Genitourinary: Negative for dysuria and flank pain.  Musculoskeletal: Negative for neck pain and neck stiffness.  Skin: Negative for rash and wound.  Allergic/Immunologic: Negative for immunocompromised state.  Neurological: Negative for syncope, weakness and headaches.  Psychiatric/Behavioral: Positive for dysphoric mood and  suicidal ideas.  All other systems reviewed and are negative.    Physical Exam Updated Vital Signs BP 95/60   Pulse (!) 50   Temp 98.6 F (37 C)   Resp 16   SpO2 98%   Physical Exam  Constitutional: He appears well-developed and well-nourished. No distress.  HENT:  Head: Normocephalic and atraumatic.  Eyes: Conjunctivae are normal.  Neck: Neck supple.  Cardiovascular: Normal rate, regular rhythm and normal heart sounds.  Exam reveals no  friction rub.   No murmur heard. Pulmonary/Chest: Effort normal and breath sounds normal. No respiratory distress. He has no wheezes. He has no rales.  Abdominal: He exhibits no distension.  Musculoskeletal: He exhibits no edema.  Neurological: He exhibits normal muscle tone.  Drowsy but arousable. Pupils dilated but reactive bilaterally. No facial asymmetry. Moves all extremities with 5 out of 5 strength. Endorses normal sensation to light touch.  Skin: Skin is warm. Capillary refill takes less than 2 seconds. No rash noted.  Psychiatric:  Active suicidal ideation  Nursing note and vitals reviewed.    ED Treatments / Results  Labs (all labs ordered are listed, but only abnormal results are displayed) Labs Reviewed  COMPREHENSIVE METABOLIC PANEL - Abnormal; Notable for the following:       Result Value   Glucose, Bld 104 (*)    ALT 10 (*)    Total Bilirubin 1.6 (*)    All other components within normal limits  ACETAMINOPHEN LEVEL - Abnormal; Notable for the following:    Acetaminophen (Tylenol), Serum <10 (*)    All other components within normal limits  RAPID URINE DRUG SCREEN, HOSP PERFORMED - Abnormal; Notable for the following:    Benzodiazepines POSITIVE (*)    Tetrahydrocannabinol POSITIVE (*)    All other components within normal limits  CBC WITH DIFFERENTIAL/PLATELET  SALICYLATE LEVEL    EKG  EKG Interpretation  Date/Time:  Tuesday September 10 2016 11:18:07 EST Ventricular Rate:  61 PR Interval:    QRS Duration: 86 QT Interval:  371 QTC Calculation: 374 R Axis:   60 Text Interpretation:  Sinus rhythm Baseline wander in lead(s) V5 No significant change since last tracing Confirmed by Raissa Dam MD, Sheria LangAMERON [phone]([id number]) on 09/10/2016 3:26:09 PM       Radiology No results found.  Procedures Procedures (including critical care time)  Medications Ordered in ED Medications  sodium chloride 0.9 % bolus 1,000 mL (0 mLs Intravenous Stopped 09/10/16 1221)      Initial Impression / Assessment and Plan / ED Course  I have reviewed the triage vital signs and the nursing notes.  Pertinent labs & imaging results that were available during my care of the patient were reviewed by me and considered in my medical decision making (see chart for details).  Clinical Course as of Sep 10 2058  Tue Sep 10, 2016  1756 Heart rate in the 7250s.  BP in the low 90s.  I think he will need a longer period of observation.   [JK]    Clinical Course User Index [JK] Linwood DibblesJon Knapp, MD    20 year old male with past medical history as above who presents with intentional overdose on Keppra, Xanax, and Benadryl. Poison control notified. On arrival, vital signs are stable. Patient drowsy but protecting his airway. No evidence of significant respiratory depression or anticholinergic toxidrome. Per discussion with poison control, will monitor for 6 hours, repeat EKG, and consult psych once medically stable.  Patient remains hemodynamically stable. He is increasingly alert and  oriented. Vital signs remain stable but pt noted to be mildly bradycardic at this time. BP with nadir in 90s - per review of records, baseline HR 50-60s. Denies any other co-ingestants, but will need continued monitoring. Pt o/w without lightheadedness, CP, or other sx.. Will continue to monitor with repeat EKG at 1700 and psychiatric consultation if EKG is unremarkable and HR/BP improves.  Final Clinical Impressions(s) / ED Diagnoses   Final diagnoses:  Intentional drug overdose, initial encounter [facility])    New Prescriptions New Prescriptions   No medications on file     Shaune Pollack, MD 09/10/16 1606    Shaune Pollack, MD 09/10/16 2100

## 2016-09-10 NOTE — BH Assessment (Addendum)
Tele Assessment Note   Jaime Nixon is an 20 y.o. male presenting due to intentional overdose of "Xanax, Benadryl, seizure medication" and additional unknown prescription. Pt unsure of quantities and dosages consumed. Pt stated overdose was a suicide attempt and continues to endorse suicidal ideation at this interview. Pt states "I've been through more than plenty. More than somebody my age should have to go through". Pt denies psychiatric history/treatment and states he is not prescribed Xanax. Pt reports history of seizure. Pt estimates last seizure to have occurred approximately 6 months ago. Pt reports multiple stressors including current probation for domestic violence, probation violation, wife leaving unexpectedly with child, job loss, grandmother having cancer, "my father can't walk", and risk of losing home. Pt states "I'm getting ready to lose it all".  Pt denies lifetime history of violence/aggression. Pt denies homicidal ideation. Pt reports auditory hallucinations with command to harm himself. Pt reports daily THC and Xanax ("footballs") use. Pt denies regular alcohol use.  Pt UDS + Benzos, +Cocaine Pt BAL <5  Diagnosis: Major depressive disorder Cocaine Use  Sedative, Hypnotic and Anxiolytic use    Past Medical History:  Past Medical History:  Diagnosis Date  . Current smoker   . History of substance use   . Seizures (HCC)     History reviewed. No pertinent surgical history.  Family History:  Family History  Problem Relation Age of Onset  . Seizures Father   . Seizures Brother     Social History:  reports that he has been smoking Cigarettes.  He has a 10.00 pack-year smoking history. He has never used smokeless tobacco. He reports that he drinks alcohol. He reports that he uses drugs, including Marijuana.  Additional Social History:  Alcohol / Drug Use Pain Medications: Pt denies abuse Prescriptions: daily Xanax use "school buses" Over the Counter: Pt denies  abuse History of alcohol / drug use?: Yes Negative Consequences of Use:  (Not Reported) Withdrawal Symptoms:  (None Endorsed) Substance #1 Name of Substance 1: THC 1 - Age of First Use: 10 1 - Amount (size/oz): 3-4 blunts 1 - Frequency: daily 1 - Duration: ongoing 1 - Last Use / Amount: 11.13.17/Not Reported  CIWA: CIWA-Ar BP: (!) 78/59 Pulse Rate: (!) 59 COWS:    PATIENT STRENGTHS: (choose at least two) Average or above average intelligence Supportive family/friends  Allergies:  Allergies  Allergen Reactions  . Dexamethasone Shortness Of Breath and Other (See Comments)    Reaction:  Makes pt angry   . Prednisone Other (See Comments)    Reaction:  Makes pt angry     Home Medications:  (Not in a hospital admission)  OB/GYN Status:  No LMP for male patient.  General Assessment Data Location of Assessment: [facility] ED TTS Assessment: In system Is this a Tele or Face-to-Face Assessment?: Face-to-Face Is this an Initial Assessment or a Re-assessment for this encounter?: Initial Assessment Marital status: Separated Is patient pregnant?: No Pregnancy Status: No Living Arrangements: Alone Can pt return to current living arrangement?: Yes Admission Status: Voluntary Is patient capable of signing voluntary admission?: Yes Referral Source: Self/Family/Friend Insurance type: Self-Pay     Crisis Care Plan Living Arrangements: Alone Name of Psychiatrist: None Name of Therapist: None  Education Status Is patient currently in school?: No Highest grade of school patient has completed: 9th  Risk to self with the past 6 months Suicidal Ideation: Yes-Currently Present Has patient been a risk to self within the past 6 months prior to admission? : No Suicidal  Intent: Yes-Currently Present Has patient had any suicidal intent within the past 6 months prior to admission? : Yes Is patient at risk for suicide?: Yes Suicidal Plan?: Yes-Currently Present Has patient had any suicidal  plan within the past 6 months prior to admission? : Yes Previous Attempts/Gestures: Yes How many times?: 1 Triggers for Past Attempts: None known Family Suicide History: Yes ("yes a long long line") Depression: Yes Depression Symptoms: Despondent, Tearfulness, Insomnia, Feeling angry/irritable, Loss of interest in usual pleasures, Feeling worthless/self pity Substance abuse history and/or treatment for substance abuse?: Yes Suicide prevention information given to non-admitted patients: Not applicable  Risk to Others within the past 6 months Homicidal Ideation: No Does patient have any lifetime risk of violence toward others beyond the six months prior to admission? : Unknown Thoughts of Harm to Others: No Current Homicidal Intent: No Current Homicidal Plan: No Access to Homicidal Means: No History of harm to others?: No Does patient have access to weapons?: No Criminal Charges Pending?: Yes Describe Pending Criminal Charges: failure to appear, probation violation Does patient have a court date: Yes Court Date:  (09/18/16, 09/27/16) Is patient on probation?: Yes  Psychosis Hallucinations: Auditory, With command (onset 69yrago) Delusions: None noted  Mental Status Report Appearance/Hygiene: In scrubs Eye Contact: Good Motor Activity: Unremarkable Speech: Logical/coherent Level of Consciousness: Alert Mood: Depressed Affect: Appropriate to circumstance Anxiety Level: Minimal Thought Processes: Relevant, Coherent Judgement: Partial Orientation: Person, Time, Place, Situation Obsessive Compulsive Thoughts/Behaviors: None  Cognitive Functioning Concentration: Decreased Memory: Recent Intact, Remote Intact IQ: Average Insight: Fair Impulse Control: Poor Appetite: Poor Weight Loss: 10 Weight Gain: 0 Sleep: Decreased Total Hours of Sleep: 2 Vegetative Symptoms: Unable to Assess  ADLScreening [facility] Assessment Services) Patient's cognitive ability adequate to safely  complete daily activities?: Yes Patient able to express need for assistance with ADLs?: Yes Independently performs ADLs?: Yes (appropriate for developmental age)  Prior Inpatient Therapy Prior Inpatient Therapy: No  Prior Outpatient Therapy Prior Outpatient Therapy: No Does patient have an ACCT team?: No Does patient have Intensive In-House Services?  : No Does patient have [facility] services? : No Does patient have P4CC services?: No  ADL Screening (condition at time of admission) Patient's cognitive ability adequate to safely complete daily activities?: Yes Is the patient deaf or have difficulty hearing?: No Does the patient have difficulty seeing, even when wearing glasses/contacts?: No Does the patient have difficulty concentrating, remembering, or making decisions?: Yes Patient able to express need for assistance with ADLs?: Yes Does the patient have difficulty dressing or bathing?: No Independently performs ADLs?: Yes (appropriate for developmental age) Does the patient have difficulty walking or climbing stairs?: No Weakness of Legs: None Weakness of Arms/Hands: None  Home Assistive Devices/Equipment Home Assistive Devices/Equipment: None  Therapy Consults (therapy consults require a physician order) PT Evaluation Needed: No OT Evalulation Needed: No SLP Evaluation Needed: No       Advance Directives (For Healthcare) Does patient have an advance directive?: No Would patient like information on creating an advanced directive?: No - patient declined information    Additional Information 1:1 In Past 12 Months?: Yes CIRT Risk: No Elopement Risk: No Does patient have medical clearance?: No     Disposition: Clinician consulted with Donell SievertSpencer Simon, PA. Pt disposition pending medical clearance.  Disposition Initial Assessment Completed for this Encounter: Yes Disposition of Patient: Other dispositions Other disposition(s): Other (Comment)  Jaime Nixon  [place] 09/10/2016 8:09 PM

## 2016-09-11 DIAGNOSIS — I952 Hypotension due to drugs: Secondary | ICD-10-CM | POA: Diagnosis not present

## 2016-09-11 DIAGNOSIS — G40909 Epilepsy, unspecified, not intractable, without status epilepticus: Secondary | ICD-10-CM | POA: Diagnosis not present

## 2016-09-11 DIAGNOSIS — T50902A Poisoning by unspecified drugs, medicaments and biological substances, intentional self-harm, initial encounter: Secondary | ICD-10-CM | POA: Diagnosis not present

## 2016-09-11 DIAGNOSIS — T50905A Adverse effect of unspecified drugs, medicaments and biological substances, initial encounter: Secondary | ICD-10-CM | POA: Diagnosis not present

## 2016-09-11 DIAGNOSIS — R001 Bradycardia, unspecified: Secondary | ICD-10-CM | POA: Diagnosis not present

## 2016-09-11 DIAGNOSIS — T50902D Poisoning by unspecified drugs, medicaments and biological substances, intentional self-harm, subsequent encounter: Secondary | ICD-10-CM

## 2016-09-11 DIAGNOSIS — F410 Panic disorder [episodic paroxysmal anxiety] without agoraphobia: Secondary | ICD-10-CM | POA: Diagnosis not present

## 2016-09-11 LAB — BASIC METABOLIC PANEL
Anion gap: 4 — ABNORMAL LOW (ref 5–15)
BUN: 16 mg/dL (ref 6–20)
CHLORIDE: 109 mmol/L (ref 101–111)
CO2: 27 mmol/L (ref 22–32)
Calcium: 8.9 mg/dL (ref 8.9–10.3)
Creatinine, Ser: 0.92 mg/dL (ref 0.61–1.24)
Glucose, Bld: 105 mg/dL — ABNORMAL HIGH (ref 65–99)
POTASSIUM: 4 mmol/L (ref 3.5–5.1)
SODIUM: 140 mmol/L (ref 135–145)

## 2016-09-11 LAB — MRSA PCR SCREENING: MRSA BY PCR: NEGATIVE

## 2016-09-11 MED ORDER — LEVETIRACETAM 500 MG PO TABS
500.0000 mg | ORAL_TABLET | Freq: Two times a day (BID) | ORAL | Status: DC
  Administered 2016-09-12 – 2016-09-18 (×13): 500 mg via ORAL
  Filled 2016-09-11 (×13): qty 1

## 2016-09-11 MED ORDER — LORAZEPAM 2 MG/ML IJ SOLN
0.5000 mg | Freq: Four times a day (QID) | INTRAMUSCULAR | Status: DC | PRN
  Administered 2016-09-11 – 2016-09-12 (×2): 0.5 mg via INTRAVENOUS
  Filled 2016-09-11 (×5): qty 1

## 2016-09-11 MED ORDER — SODIUM CHLORIDE 0.9 % IV BOLUS (SEPSIS)
1000.0000 mL | Freq: Once | INTRAVENOUS | Status: AC
  Administered 2016-09-11: 1000 mL via INTRAVENOUS

## 2016-09-11 MED ORDER — LORAZEPAM 2 MG/ML IJ SOLN
1.0000 mg | Freq: Four times a day (QID) | INTRAMUSCULAR | Status: DC | PRN
Start: 2016-09-11 — End: 2016-09-11

## 2016-09-11 MED ORDER — NICOTINE 21 MG/24HR TD PT24
21.0000 mg | MEDICATED_PATCH | Freq: Every day | TRANSDERMAL | Status: DC
  Administered 2016-09-11 – 2016-09-15 (×4): 21 mg via TRANSDERMAL
  Filled 2016-09-11 (×4): qty 1

## 2016-09-11 MED ORDER — LORAZEPAM 2 MG/ML IJ SOLN
2.0000 mg | Freq: Once | INTRAMUSCULAR | Status: AC
  Administered 2016-09-11: 2 mg via INTRAMUSCULAR

## 2016-09-11 MED ORDER — ZOLPIDEM TARTRATE 5 MG PO TABS
5.0000 mg | ORAL_TABLET | Freq: Every evening | ORAL | Status: DC | PRN
  Administered 2016-09-11 – 2016-09-17 (×5): 5 mg via ORAL
  Filled 2016-09-11 (×5): qty 1

## 2016-09-11 NOTE — Progress Notes (Signed)
IVC paperwork completed. Faxed to Magistrate, confirmed it has been received. Patient will be served by [employer].   Vivi BarrackNicole Briarrose Shor, Theresia MajorsLCSWA, MSW Clinical Social Worker 5E and Psychiatric Service Line [phone] 09/11/2016  2:39 PM

## 2016-09-11 NOTE — Progress Notes (Signed)
Around 1345 pt became very anxious and agitated standing in room stating that he wanted to go outside.  Pt then left room and got on elevator because he "needed to go outside".  Security called and patient was found on 1st floor. Patient was then escorted back to room 1426.  MD paged and orders received.  IVC paperwork in process. Family at bedside and left to assist in calming patient down.  Patient Aunt stated that patients girlfriend is 6 months pregnant and patient just found out that the baby is not his. Sitter at bedside. Will continue to monitor. Jaime Nixon A

## 2016-09-11 NOTE — Progress Notes (Signed)
PROGRESS NOTE    Jaime Keenshomas J Avitabile  ZOX:[id number] DOB: December 16, 1995 DOA: 09/10/2016  PCP: Pete Glatterawn T Langeland, MD   Brief Narrative:  Jaime Nixon is a 20 y.o. male with medical history significant of substance abuse and seizure disorder.  He presents via EMS with OD.  He texted his girlfriend and said he was going to kill himself by taking large amounts of keppra, benadryl, xanax and one other pill which was his grandmothers.  Initially lethargic on arrival.  Subjective: Alert this AM, calm, without complaints. Stated he felt well. Later, per RN he became anxious, restless, pulled out IV and tried to leave the hospital.   Assessment & Plan:   Principal Problem:   OD (overdose of drug), intentional self-harm, initial encounter - sitter at bedside - psych eval requested  Active Problems:  Panic attack - PRN Ativan- given 2 mg IM today after he pulled out IV - psych eval     Seizure disorder  - hold Keppra for now    Bradycardia - suspected to be drug induced although not sure which drug he took- he was here recently and did not have this issue - was given 2 doses of Ativan to help HR and BP - asymptomatic    Hypotension  - due to drugs and possibly also bradycardia   DVT prophylaxis: Lovenox Code Status: Full  Family Communication:  Disposition Plan: per psych eval Consultants:   psych Procedures:    Antimicrobials:  Anti-infectives    None       Objective: Vitals:   09/11/16 0800 09/11/16 0900 09/11/16 1103 09/11/16 1512  BP: (!) 107/53 (!) 102/59 (!) 118/56 127/63  Pulse: (!) 59 65 (!) 50 63  Resp: (!) 22 (!) 24  20  Temp: 97.8 F (36.6 C)  97.7 F (36.5 C) 99.6 F (37.6 C)  TempSrc: Oral  Oral Oral  SpO2: 99% 100% 100% 100%  Weight:      Height:        Intake/Output Summary (Last 24 hours) at 09/11/16 1605 Last data filed at 09/11/16 1536  Gross per 24 hour  Intake          3063.75 ml  Output                0 ml  Net          3063.75 ml    Filed Weights   09/10/16 2200  Weight: 85.7 kg (188 lb 15 oz)    Examination: General exam: Appears comfortable  HEENT: PERRLA, oral mucosa moist, no sclera icterus or thrush Respiratory system: Clear to auscultation. Respiratory effort normal. Cardiovascular system: S1 & S2 heard, RRR.  No murmurs - HR 40-60s Gastrointestinal system: Abdomen soft, non-tender, nondistended. Normal bowel sound. No organomegaly Central nervous system: Alert and oriented. No focal neurological deficits. Extremities: No cyanosis, clubbing or edema Skin: No rashes or ulcers Psychiatry:  Mood & affect appropriate.     Data Reviewed: I have personally reviewed following labs and imaging studies  CBC:  Recent Labs Lab 09/10/16 1120  WBC 9.4  NEUTROABS 6.8  HGB 15.3  HCT 44.0  MCV 89.8  PLT 215   Basic Metabolic Panel:  Recent Labs Lab 09/10/16 1120 09/11/16 0315  NA 137 140  K 4.7 4.0  CL 106 109  CO2 24 27  GLUCOSE 104* 105*  BUN 17 16  CREATININE 0.86 0.92  CALCIUM 9.6 8.9   GFR: Estimated Creatinine Clearance: 142.3 mL/min (by C-G formula  based on SCr of 0.92 mg/dL). Liver Function Tests:  Recent Labs Lab 09/10/16 1120  AST 27  ALT 10*  ALKPHOS 56  BILITOT 1.6*  PROT 7.3  ALBUMIN 4.6   No results for input(s): LIPASE, AMYLASE in the last 168 hours. No results for input(s): AMMONIA in the last 168 hours. Coagulation Profile: No results for input(s): INR, PROTIME in the last 168 hours. Cardiac Enzymes: No results for input(s): CKTOTAL, CKMB, CKMBINDEX, TROPONINI in the last 168 hours. BNP (last 3 results) No results for input(s): PROBNP in the last 8760 hours. HbA1C: No results for input(s): HGBA1C in the last 72 hours. CBG: No results for input(s): GLUCAP in the last 168 hours. Lipid Profile: No results for input(s): CHOL, HDL, LDLCALC, TRIG, CHOLHDL, LDLDIRECT in the last 72 hours. Thyroid Function Tests: No results for input(s): TSH, T4TOTAL, FREET4, T3FREE,  THYROIDAB in the last 72 hours. Anemia Panel: No results for input(s): VITAMINB12, FOLATE, FERRITIN, TIBC, IRON, RETICCTPCT in the last 72 hours. Urine analysis:    Component Value Date/Time   COLORURINE YELLOW 05/25/2016 1937   APPEARANCEUR CLEAR 05/25/2016 1937   LABSPEC 1.024 05/25/2016 1937   PHURINE 7.5 05/25/2016 1937   GLUCOSEU NEGATIVE 05/25/2016 1937   HGBUR NEGATIVE 05/25/2016 1937   BILIRUBINUR NEGATIVE 05/25/2016 1937   KETONESUR 15 (A) 05/25/2016 1937   PROTEINUR NEGATIVE 05/25/2016 1937   UROBILINOGEN 0.2 06/01/2015 0503   NITRITE NEGATIVE 05/25/2016 1937   LEUKOCYTESUR NEGATIVE 05/25/2016 1937   Sepsis Labs: @LABRCNTIP (procalcitonin:4,lacticidven:4) ) Recent Results (from the past 240 hour(s))  MRSA PCR Screening     Status: None   Collection Time: 09/10/16 10:59 PM  Result Value Ref Range Status   MRSA by PCR NEGATIVE NEGATIVE Final    Comment:        The GeneXpert MRSA Assay (FDA approved for NASAL specimens only), is one component of a comprehensive MRSA colonization surveillance program. It is not intended to diagnose MRSA infection nor to guide or monitor treatment for MRSA infections.          Radiology Studies: No results found.    Scheduled Meds: . enoxaparin (LOVENOX) injection  40 mg Subcutaneous Q24H  . nicotine  21 mg Transdermal Daily   Continuous Infusions: . sodium chloride 125 mL/hr at 09/11/16 0829     LOS: 1 day    Time spent in minutes: 35    Hayla Hinger, MD [facility] Pager: [email or web address] Password [email or web address] 09/11/2016, 4:05 PM

## 2016-09-11 NOTE — Progress Notes (Signed)
Nutrition Brief Note  Patient identified on the Malnutrition Screening Tool (MST) Report. Pt admitted following OD. He reported 8-10 lb weight loss in the past 2-3 weeks. Weight has been trending up since July/August 2017.  Wt Readings from Last 15 Encounters:  09/10/16 188 lb 15 oz (85.7 kg)  06/20/16 180 lb (81.6 kg)  05/26/16 184 lb 3.2 oz (83.6 kg)  04/18/16 181 lb (82.1 kg)  01/01/16 181 lb (82.1 kg)  12/27/15 176 lb (79.8 kg)  06/01/15 167 lb 2 oz (75.8 kg)  12/05/14 179 lb (81.2 kg)  05/22/14 177 lb (80.3 kg) (78 %, Z= 0.76)*  01/28/14 160 lb (72.6 kg) (58 %, Z= 0.21)*   * Growth percentiles are based on CDC 2-20 Years data.    Body mass index is 24.93 kg/m. Patient meets criteria for normal weight/borderline overweight based on current BMI.   Current diet order is Heart Healthy. Pt ate 50% of dinner last night and 50% of breakfast this AM (which consisted of pancakes with scrambled eggs, pancakes with syrup, English muffin with cream cheese, and orange juice).   Labs and medications reviewed.  IVF: NS @ 125 mL/hr.   No nutrition interventions warranted at this time. If nutrition issues arise, please consult RD.     Trenton GammonJessica Brelynn Wheller, MS, RD, LDN Inpatient Clinical Dietitian Pager # [phone] After hours/weekend pager # [phone]

## 2016-09-11 NOTE — Care Management Note (Signed)
Case Management Note  Patient Details  Name: Jaime Nixon MRN: [id number] Date of Birth: [birth year]  Subjective/Objective:        overdose            Action/Plan:  From home  Date:  September 11, 2016 Chart reviewed for concurrent status and case management needs. Will continue to follow patient progress. Discharge Planning: following for needs Expected discharge date: [id number] [name], BSN, [place]3, [place]   [phone]   Expected Discharge Date:   (unknown)               Expected Discharge Plan:  Home/Self Care  In-House Referral:  Clinical Social Work  Discharge planning Services     Post Acute Care Choice:    Choice offered to:     DME Arranged:    DME Agency:     HH Arranged:    HH Agency:     Status of Service:  In process, will continue to follow  If discussed at Long Length of Stay Meetings, dates discussed:    Additional Comments:  [name], [name], RN 09/11/2016, 9:43 AM

## 2016-09-12 DIAGNOSIS — R45851 Suicidal ideations: Secondary | ICD-10-CM

## 2016-09-12 DIAGNOSIS — I952 Hypotension due to drugs: Secondary | ICD-10-CM | POA: Diagnosis not present

## 2016-09-12 DIAGNOSIS — T1491XA Suicide attempt, initial encounter: Secondary | ICD-10-CM

## 2016-09-12 DIAGNOSIS — Z888 Allergy status to other drugs, medicaments and biological substances status: Secondary | ICD-10-CM

## 2016-09-12 DIAGNOSIS — R001 Bradycardia, unspecified: Secondary | ICD-10-CM | POA: Diagnosis not present

## 2016-09-12 DIAGNOSIS — F1310 Sedative, hypnotic or anxiolytic abuse, uncomplicated: Secondary | ICD-10-CM

## 2016-09-12 DIAGNOSIS — F1210 Cannabis abuse, uncomplicated: Secondary | ICD-10-CM

## 2016-09-12 DIAGNOSIS — T426X2A Poisoning by other antiepileptic and sedative-hypnotic drugs, intentional self-harm, initial encounter: Principal | ICD-10-CM

## 2016-09-12 DIAGNOSIS — F322 Major depressive disorder, single episode, severe without psychotic features: Secondary | ICD-10-CM

## 2016-09-12 DIAGNOSIS — T424X2A Poisoning by benzodiazepines, intentional self-harm, initial encounter: Secondary | ICD-10-CM

## 2016-09-12 DIAGNOSIS — T450X2A Poisoning by antiallergic and antiemetic drugs, intentional self-harm, initial encounter: Secondary | ICD-10-CM

## 2016-09-12 DIAGNOSIS — T50902A Poisoning by unspecified drugs, medicaments and biological substances, intentional self-harm, initial encounter: Secondary | ICD-10-CM | POA: Diagnosis not present

## 2016-09-12 DIAGNOSIS — T50902D Poisoning by unspecified drugs, medicaments and biological substances, intentional self-harm, subsequent encounter: Secondary | ICD-10-CM | POA: Diagnosis not present

## 2016-09-12 DIAGNOSIS — F17210 Nicotine dependence, cigarettes, uncomplicated: Secondary | ICD-10-CM

## 2016-09-12 DIAGNOSIS — Z79899 Other long term (current) drug therapy: Secondary | ICD-10-CM

## 2016-09-12 MED ORDER — LORAZEPAM 2 MG/ML IJ SOLN
1.0000 mg | Freq: Four times a day (QID) | INTRAMUSCULAR | Status: DC | PRN
  Administered 2016-09-12: 1 mg via INTRAMUSCULAR
  Filled 2016-09-12: qty 1

## 2016-09-12 MED ORDER — CLONAZEPAM 0.5 MG PO TABS
0.5000 mg | ORAL_TABLET | Freq: Two times a day (BID) | ORAL | Status: DC | PRN
  Administered 2016-09-12 – 2016-09-13 (×2): 0.5 mg via ORAL
  Filled 2016-09-12 (×3): qty 1

## 2016-09-12 MED ORDER — CLONAZEPAM 0.5 MG PO TABS
0.5000 mg | ORAL_TABLET | Freq: Once | ORAL | Status: AC
  Administered 2016-09-12: 0.5 mg via ORAL
  Filled 2016-09-12: qty 1

## 2016-09-12 MED ORDER — LORAZEPAM 1 MG PO TABS
2.0000 mg | ORAL_TABLET | Freq: Once | ORAL | Status: AC
  Administered 2016-09-12: 2 mg via ORAL
  Filled 2016-09-12: qty 2
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
## 2016-09-12 NOTE — Progress Notes (Signed)
Patient removed IV and refuses new IV start. States he understands that he cannot receive Ativan IV without an IV site. Dr Butler Denmarkizwan notified via text page and order obtained for telemetry to be discontinued.

## 2016-09-12 NOTE — Progress Notes (Signed)
PROGRESS NOTE    Jaime Keenshomas J Shoun  GNF:[id number] DOB: 27-Oct-1996 DOA: 09/10/2016  PCP: Pete Glatterawn T Langeland, MD   Brief Narrative:  Jaime Nixon is a 20 y.o. male with medical history significant of substance abuse and seizure disorder.  He presents via EMS with OD.  He texted his girlfriend and said he was going to kill himself by taking large amounts of keppra, benadryl, xanax and one other pill which was his grandmothers.  Initially lethargic on arrival.  Subjective:   He had a panic attack prior to me seeing him but was calm on my eval.. He wants to go home. No complaints.   Assessment & Plan:   Principal Problem:   OD (overdose of drug), intentional self-harm, initial encounter - sitter at bedside - psych eval requested- recommended to be admitted to psych facility- [facility] has no [facility] to go elsewhere  Active Problems:  Panic attack - PRN Ativan- given 2 mg IM after he pulled out IV which helped him settle down - pulled out IV again today in the middle of a panic attack- have placed him on clonazepam    Seizure disorder  - resumed Keppra today    Bradycardia - suspected to be drug induced although not sure which drug he took- he was here recently and did not have this issue - was given 2 doses of Ativan to help HR and BP - asymptomatic - HR now improved to 70-80s- d/c telemetry    Hypotension  - due to drugs and possibly also bradycardia- improved   DVT prophylaxis: Lovenox Code Status: Full  Family Communication:  [facility] has no insurance to go elsewhere Consultants:   psych Procedures:    Antimicrobials:  Anti-infectives    None       Objective: Vitals:   09/11/16 1103 09/11/16 1512 09/11/16 2200 09/12/16 0547  BP: (!) 118/56 127/63 119/66 119/73  Pulse: (!) 50 63 77 81  Resp:  20 20 16   Temp: 97.7 F (36.5 C) 99.6 F (37.6 C) 98.7 F (37.1 C) 98.9 F (37.2 C)  TempSrc: Oral Oral  Oral Oral  SpO2: 100% 100% 100% 100%  Weight:      Height:        Intake/Output Summary (Last 24 hours) at 09/12/16 1352 Last data filed at 09/12/16 0200  Gross per 24 hour  Intake          2462.92 ml  Output                0 ml  Net          2462.92 ml   Filed Weights   09/10/16 2200  Weight: 85.7 kg (188 lb 15 oz)    Examination: General exam: Appears comfortable  HEENT: PERRLA, oral mucosa moist, no sclera icterus or thrush Respiratory system: Clear to auscultation. Respiratory effort normal. Cardiovascular system: S1 & S2 heard, RRR.  No murmurs - HR 470-80s Gastrointestinal system: Abdomen soft, non-tender, nondistended. Normal bowel sound. No organomegaly Central nervous system: Alert and oriented. No focal neurological deficits. Extremities: No cyanosis, clubbing or edema Skin: No rashes or ulcers Psychiatry:  Mood & affect appropriate.     Data Reviewed: I have personally reviewed following labs and imaging studies  CBC:  Recent Labs Lab 09/10/16 1120  WBC 9.4  NEUTROABS 6.8  HGB 15.3  HCT 44.0  MCV 89.8  PLT 215  Basic Metabolic Panel:  Recent Labs Lab 09/10/16 1120 09/11/16 0315  NA 137 140  K 4.7 4.0  CL 106 109  CO2 24 27  GLUCOSE 104* 105*  BUN 17 16  CREATININE 0.86 0.92  CALCIUM 9.6 8.9   GFR: Estimated Creatinine Clearance: 142.3 mL/min (by C-G formula based on SCr of 0.92 mg/dL). Liver Function Tests:  Recent Labs Lab 09/10/16 1120  AST 27  ALT 10*  ALKPHOS 56  BILITOT 1.6*  PROT 7.3  ALBUMIN 4.6   No results for input(s): LIPASE, AMYLASE in the last 168 hours. No results for input(s): AMMONIA in the last 168 hours. Coagulation Profile: No results for input(s): INR, PROTIME in the last 168 hours. Cardiac Enzymes: No results for input(s): CKTOTAL, CKMB, CKMBINDEX, TROPONINI in the last 168 hours. BNP (last 3 results) No results for input(s): PROBNP in the last 8760 hours. HbA1C: No results for input(s): HGBA1C in the  last 72 hours. CBG: No results for input(s): GLUCAP in the last 168 hours. Lipid Profile: No results for input(s): CHOL, HDL, LDLCALC, TRIG, CHOLHDL, LDLDIRECT in the last 72 hours. Thyroid Function Tests: No results for input(s): TSH, T4TOTAL, FREET4, T3FREE, THYROIDAB in the last 72 hours. Anemia Panel: No results for input(s): VITAMINB12, FOLATE, FERRITIN, TIBC, IRON, RETICCTPCT in the last 72 hours. Urine analysis:    Component Value Date/Time   COLORURINE YELLOW 05/25/2016 1937   APPEARANCEUR CLEAR 05/25/2016 1937   LABSPEC 1.024 05/25/2016 1937   PHURINE 7.5 05/25/2016 1937   GLUCOSEU NEGATIVE 05/25/2016 1937   HGBUR NEGATIVE 05/25/2016 1937   BILIRUBINUR NEGATIVE 05/25/2016 1937   KETONESUR 15 (A) 05/25/2016 1937   PROTEINUR NEGATIVE 05/25/2016 1937   UROBILINOGEN 0.2 06/01/2015 0503   NITRITE NEGATIVE 05/25/2016 1937   LEUKOCYTESUR NEGATIVE 05/25/2016 1937   Sepsis Labs: @LABRCNTIP (procalcitonin:4,lacticidven:4) ) Recent Results (from the past 240 hour(s))  MRSA PCR Screening     Status: None   Collection Time: 09/10/16 10:59 PM  Result Value Ref Range Status   MRSA by PCR NEGATIVE NEGATIVE Final    Comment:        The GeneXpert MRSA Assay (FDA approved for NASAL specimens only), is one component of a comprehensive MRSA colonization surveillance program. It is not intended to diagnose MRSA infection nor to guide or monitor treatment for MRSA infections.          Radiology Studies: No results found.    Scheduled Meds: . enoxaparin (LOVENOX) injection  40 mg Subcutaneous Q24H  . levETIRAcetam  500 mg Oral BID  . nicotine  21 mg Transdermal Daily   Continuous Infusions: . sodium chloride 125 mL/hr at 09/12/16 0240     LOS: 2 days    Time spent in minutes: 35    Lajune Perine, MD [facility] Pager: [email or web address] Password [email or web address] 09/12/2016, 1:52 PM

## 2016-09-13 DIAGNOSIS — G40909 Epilepsy, unspecified, not intractable, without status epilepticus: Secondary | ICD-10-CM | POA: Diagnosis not present

## 2016-09-13 DIAGNOSIS — R001 Bradycardia, unspecified: Secondary | ICD-10-CM

## 2016-09-13 DIAGNOSIS — T50902A Poisoning by unspecified drugs, medicaments and biological substances, intentional self-harm, initial encounter: Secondary | ICD-10-CM | POA: Diagnosis not present

## 2016-09-13 MED ORDER — CLONAZEPAM 0.5 MG PO TABS
0.5000 mg | ORAL_TABLET | Freq: Two times a day (BID) | ORAL | Status: DC | PRN
  Administered 2016-09-13 – 2016-09-18 (×11): 0.5 mg via ORAL
  Filled 2016-09-13 (×11): qty 1

## 2016-09-13 MED ORDER — LORAZEPAM 2 MG/ML IJ SOLN: 1.0000 mg | Freq: Four times a day (QID) | INTRAMUSCULAR | Status: DC | PRN

## 2016-09-13 MED ORDER — FLUOXETINE HCL 20 MG PO CAPS
20.0000 mg | ORAL_CAPSULE | Freq: Every day | ORAL | Status: DC
  Administered 2016-09-13 – 2016-09-18 (×6): 20 mg via ORAL
  Filled 2016-09-13 (×5): qty 1

## 2016-09-13 MED ORDER — LORAZEPAM 1 MG PO TABS
1.0000 mg | ORAL_TABLET | Freq: Four times a day (QID) | ORAL | Status: DC | PRN
  Administered 2016-09-13 – 2016-09-14 (×3): 1 mg via ORAL
  Filled 2016-09-13 (×3): qty 1

## 2016-09-13 NOTE — Progress Notes (Signed)
PROGRESS NOTE    Jaime Nixon  [id number] DOB: [birth year] DOA: 09/10/2016  PCP: Pete Glatterawn T Langeland, MD   Brief Narrative:  Jaime Nixon is a 20 y.o. male with medical history significant of substance abuse and seizure disorder.  He presents via EMS with OD.  He texted his girlfriend and said he was going to kill himself by taking large amounts of keppra, benadryl, xanax and one other pill which was his grandmothers.  Initially lethargic on arrival.  Subjective: No complaints today.   Assessment & Plan:   Principal Problem:   OD (overdose of drug), intentional self-harm, initial encounter - sitter at bedside - psych eval requested- recommended to be admitted to psych facility- [facility] to go elsewhere  Active Problems:  Panic attack - PRN Ativan- given 2 mg IM after he pulled out IV which helped him settle down - pulled out IV again on 11/16 in the middle of a panic attack- have placed him on clonazepam and PRN IM Ativan    Seizure disorder  - resumed Keppra 11/16    Bradycardia - suspected to be drug induced although not sure which drug he took- he was here recently and did not have this issue - was given 2 doses of Ativan to help HR and BP - asymptomatic - HR improved to 70-80s- d/c telemetry    Hypotension  - BP in 90-100 systolic appears to be his baseline   DVT prophylaxis: Lovenox Code Status: Full  Family Communication:  Disposition Plan[facility] has no insurance to go elsewhere Consultants:   psych Procedures:    Antimicrobials:  Anti-infectives    None       Objective: Vitals:   09/11/16 2200 09/12/16 0547 09/12/16 2232 09/13/16 0622  BP: 119/66 119/73 (!) 107/53 (!) 97/52  Pulse: 77 81 68 69  Resp: 20 16 20 20   Temp: 98.7 F (37.1 C) 98.9 F (37.2 C) 97.7 F (36.5 C) 97.7 F (36.5 C)  TempSrc: Oral Oral Oral Oral  SpO2: 100% 100% 97% 99%  Weight:      Height:       No  intake or output data in the 24 hours ending 09/13/16 1315 Filed Weights   09/10/16 2200  Weight: 85.7 kg (188 lb 15 oz)    Examination: General exam: Appears comfortable  HEENT: PERRLA, oral mucosa moist, no sclera icterus or thrush Respiratory system: Clear to auscultation. Respiratory effort normal. Cardiovascular system: S1 & S2 heard, RRR.  No murmurs -   Gastrointestinal system: Abdomen soft, non-tender, nondistended. Normal bowel sound. No organomegaly Central nervous system: Alert and oriented. No focal neurological deficits. Extremities: No cyanosis, clubbing or edema Skin: No rashes or ulcers Psychiatry:  Mood & affect appropriate.     Data Reviewed: I have personally reviewed following labs and imaging studies  CBC:  Recent Labs Lab 09/10/16 1120  WBC 9.4  NEUTROABS 6.8  HGB 15.3  HCT 44.0  MCV 89.8  PLT 215   Basic Metabolic Panel:  Recent Labs Lab 09/10/16 1120 09/11/16 0315  NA 137 140  K 4.7 4.0  CL 106 109  CO2 24 27  GLUCOSE 104* 105*  BUN 17 16  CREATININE 0.86 0.92  CALCIUM 9.6 8.9   GFR: Estimated Creatinine Clearance: 142.3 mL/min (by C-G formula based on SCr of 0.92 mg/dL). Liver Function Tests:  Recent Labs Lab 09/10/16 1120  AST  27  ALT 10*  ALKPHOS 56  BILITOT 1.6*  PROT 7.3  ALBUMIN 4.6   No results for input(s): LIPASE, AMYLASE in the last 168 hours. No results for input(s): AMMONIA in the last 168 hours. Coagulation Profile: No results for input(s): INR, PROTIME in the last 168 hours. Cardiac Enzymes: No results for input(s): CKTOTAL, CKMB, CKMBINDEX, TROPONINI in the last 168 hours. BNP (last 3 results) No results for input(s): PROBNP in the last 8760 hours. HbA1C: No results for input(s): HGBA1C in the last 72 hours. CBG: No results for input(s): GLUCAP in the last 168 hours. Lipid Profile: No results for input(s): CHOL, HDL, LDLCALC, TRIG, CHOLHDL, LDLDIRECT in the last 72 hours. Thyroid Function Tests: No  results for input(s): TSH, T4TOTAL, FREET4, T3FREE, THYROIDAB in the last 72 hours. Anemia Panel: No results for input(s): VITAMINB12, FOLATE, FERRITIN, TIBC, IRON, RETICCTPCT in the last 72 hours. Urine analysis:    Component Value Date/Time   COLORURINE YELLOW 05/25/2016 1937   APPEARANCEUR CLEAR 05/25/2016 1937   LABSPEC 1.024 05/25/2016 1937   PHURINE 7.5 05/25/2016 1937   GLUCOSEU NEGATIVE 05/25/2016 1937   HGBUR NEGATIVE 05/25/2016 1937   BILIRUBINUR NEGATIVE 05/25/2016 1937   KETONESUR 15 (A) 05/25/2016 1937   PROTEINUR NEGATIVE 05/25/2016 1937   UROBILINOGEN 0.2 06/01/2015 0503   NITRITE NEGATIVE 05/25/2016 1937   LEUKOCYTESUR NEGATIVE 05/25/2016 1937   Sepsis Labs: @LABRCNTIP (procalcitonin:4,lacticidven:4) ) Recent Results (from the past 240 hour(s))  MRSA PCR Screening     Status: None   Collection Time: 09/10/16 10:59 PM  Result Value Ref Range Status   MRSA by PCR NEGATIVE NEGATIVE Final    Comment:        The GeneXpert MRSA Assay (FDA approved for NASAL specimens only), is one component of a comprehensive MRSA colonization surveillance program. It is not intended to diagnose MRSA infection nor to guide or monitor treatment for MRSA infections.          Radiology Studies: No results found.    Scheduled Meds: . enoxaparin (LOVENOX) injection  40 mg Subcutaneous Q24H  . FLUoxetine  20 mg Oral Daily  . levETIRAcetam  500 mg Oral BID  . nicotine  21 mg Transdermal Daily   Continuous Infusions: . sodium chloride Stopped (09/12/16 0800)     LOS: 3 days    Time spent in minutes: 35    Maksym Pfiffner, MD [facility] Pager: [email or web address] Password [email or web address] 09/13/2016, 1:15 PM

## 2016-09-13 NOTE — Progress Notes (Signed)
Clinical information [facility] to [facility]

## 2016-09-13 NOTE — Progress Notes (Signed)
Patient is sitting in the bathroom with door open, listening to music on his cell phone. Sitter tells RN that patient reports getting angry after taking Prozac. Paged MD, new orders placed. Will continue to monitor.
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
## 2016-09-13 NOTE — Clinical Social Work Psych Note (Addendum)
Clinical Social Worker Psych Service Line Progress Note  Clinical Social Worker: Lia Hopping, LCSW Date/Time: 09/13/2016, 10:19 AM   Review of Patient  Overall Medical Condition:  Medically Stable for discharge  Participation Level:  Active Participation Quality: Appropriate Other Participation Quality:    Affect: Appropriate Cognitive: Oriented, Alert Reaction to Medications/Concerns:  Concerned with taking Fluoxetine for Depression  Modes of Intervention: Support, Exploration   Summary of Progress/Plan at Discharge  Summary of Progress/Plan at Discharge: [place] and Psychiatrist met with patient at bedside, patient agreeable to talk. Patient reports he is feeling better this morning. Discussed medication management and helping with patient depression. The patient reports no symptoms of a panic attack. The patient reports he has not allowed the nursing staff to place his IV,  as he does not feel he needs it. [place] educated patient about good mental and physical health.  LCSWA explained inpatient psych process, patient agreeable.   [place] faxed patient clinical information to facilities. Patient under IVC.  Waiting bed offers.

## 2016-09-14 DIAGNOSIS — T50902A Poisoning by unspecified drugs, medicaments and biological substances, intentional self-harm, initial encounter: Secondary | ICD-10-CM | POA: Diagnosis not present

## 2016-09-14 DIAGNOSIS — T50905A Adverse effect of unspecified drugs, medicaments and biological substances, initial encounter: Secondary | ICD-10-CM | POA: Diagnosis not present

## 2016-09-14 DIAGNOSIS — R001 Bradycardia, unspecified: Secondary | ICD-10-CM | POA: Diagnosis not present

## 2016-09-14 DIAGNOSIS — G40909 Epilepsy, unspecified, not intractable, without status epilepticus: Secondary | ICD-10-CM | POA: Diagnosis not present

## 2016-09-14 MED ORDER — LORAZEPAM 2 MG/ML IJ SOLN
INTRAMUSCULAR | Status: AC
  Filled 2016-09-14: qty 1

## 2016-09-14 MED ORDER — LORAZEPAM 1 MG PO TABS
1.0000 mg | ORAL_TABLET | Freq: Four times a day (QID) | ORAL | Status: DC | PRN
  Administered 2016-09-15 – 2016-09-18 (×8): 1 mg via ORAL
  Filled 2016-09-14 (×8): qty 1

## 2016-09-14 MED ORDER — LORAZEPAM 2 MG/ML IJ SOLN
1.0000 mg | Freq: Four times a day (QID) | INTRAMUSCULAR | Status: DC | PRN
  Administered 2016-09-14 – 2016-09-17 (×3): 2 mg via INTRAMUSCULAR
  Filled 2016-09-14 (×4): qty 1

## 2016-09-14 NOTE — Progress Notes (Signed)
PROGRESS NOTE    Jaime Nixon  ZOX:[id number] DOB: Feb 14, 1996 DOA: 09/10/2016  PCP: Pete Glatterawn T Langeland, MD   Brief Narrative:  Jaime Nixon is a 20 y.o. male with medical history significant of substance abuse and seizure disorder.  He presents via EMS with OD.  He texted his girlfriend and said he was going to kill himself by taking large amounts of keppra, benadryl, xanax and one other pill which was his grandmothers.  Initially lethargic on arrival.  Subjective: No complaints today.   Assessment & Plan:   Principal Problem:   OD (overdose of drug), intentional self-harm, initial encounter - sitter at bedside - psych eval requested- recommended to be admitted to psych facility- [facility] has no insurance to go elsewhere  Active Problems:  Panic attack - PRN Ativan- given 2 mg IM after he pulled out IV which helped him settle down - pulled out IV again on 11/16 in the middle of a panic attack - have placed him on clonazepam and PRN IM or POAtivan    Seizure disorder  - resumed Keppra 11/16    Bradycardia - suspected to be drug induced although not sure which drug he took- he was here recently and did not have this issue - was given 2 doses of Ativan to help HR and BP - asymptomatic - HR improved to 70-80s- d/c telemetry    Hypotension  - BP in 90-100 systolic appears to be his baseline   DVT prophylaxis: Lovenox Code Status: Full  Family Communication:  Disposition Plan: [facility] as he has no insurance to go elsewhere Consultants:   psych Procedures:    Antimicrobials:  Anti-infectives    None       Objective: Vitals:   09/12/16 2232 09/13/16 0622 09/13/16 2156 09/14/16 0525  BP: (!) 107/53 (!) 97/52 113/79 100/63  Pulse: 68 69 70 (!) 59  Resp: 20 20 18 19   Temp: 97.7 F (36.5 C) 97.7 F (36.5 C) 98.3 F (36.8 C) 97.5 F (36.4 C)  TempSrc: Oral Oral Oral Oral  SpO2: 97% 99% 99% 100%  Weight:      Height:         Intake/Output Summary (Last 24 hours) at 09/14/16 0959 Last data filed at 09/14/16 0914  Gross per 24 hour  Intake                0 ml  Output                0 ml  Net                0 ml   Filed Weights   09/10/16 2200  Weight: 85.7 kg (188 lb 15 oz)    Examination: General exam: Appears comfortable  HEENT: PERRLA, oral mucosa moist, no sclera icterus or thrush Respiratory system: Clear to auscultation. Respiratory effort normal. Cardiovascular system: S1 & S2 heard, RRR.  No murmurs -   Gastrointestinal system: Abdomen soft, non-tender, nondistended. Normal bowel sound. No organomegaly Central nervous system: Alert and oriented. No focal neurological deficits. Extremities: No cyanosis, clubbing or edema Skin: No rashes or ulcers Psychiatry:  Mood & affect appropriate.     Data Reviewed: I have personally reviewed following labs and imaging studies  CBC:  Recent Labs Lab 09/10/16 1120  WBC 9.4  NEUTROABS 6.8  HGB 15.3  HCT 44.0  MCV 89.8  PLT 215   Basic  Metabolic Panel:  Recent Labs Lab 09/10/16 1120 09/11/16 0315  NA 137 140  K 4.7 4.0  CL 106 109  CO2 24 27  GLUCOSE 104* 105*  BUN 17 16  CREATININE 0.86 0.92  CALCIUM 9.6 8.9   GFR: Estimated Creatinine Clearance: 142.3 mL/min (by C-G formula based on SCr of 0.92 mg/dL). Liver Function Tests:  Recent Labs Lab 09/10/16 1120  AST 27  ALT 10*  ALKPHOS 56  BILITOT 1.6*  PROT 7.3  ALBUMIN 4.6   No results for input(s): LIPASE, AMYLASE in the last 168 hours. No results for input(s): AMMONIA in the last 168 hours. Coagulation Profile: No results for input(s): INR, PROTIME in the last 168 hours. Cardiac Enzymes: No results for input(s): CKTOTAL, CKMB, CKMBINDEX, TROPONINI in the last 168 hours. BNP (last 3 results) No results for input(s): PROBNP in the last 8760 hours. HbA1C: No results for input(s): HGBA1C in the last 72 hours. CBG: No results for input(s): GLUCAP in the last 168  hours. Lipid Profile: No results for input(s): CHOL, HDL, LDLCALC, TRIG, CHOLHDL, LDLDIRECT in the last 72 hours. Thyroid Function Tests: No results for input(s): TSH, T4TOTAL, FREET4, T3FREE, THYROIDAB in the last 72 hours. Anemia Panel: No results for input(s): VITAMINB12, FOLATE, FERRITIN, TIBC, IRON, RETICCTPCT in the last 72 hours. Urine analysis:    Component Value Date/Time   COLORURINE YELLOW 05/25/2016 1937   APPEARANCEUR CLEAR 05/25/2016 1937   LABSPEC 1.024 05/25/2016 1937   PHURINE 7.5 05/25/2016 1937   GLUCOSEU NEGATIVE 05/25/2016 1937   HGBUR NEGATIVE 05/25/2016 1937   BILIRUBINUR NEGATIVE 05/25/2016 1937   KETONESUR 15 (A) 05/25/2016 1937   PROTEINUR NEGATIVE 05/25/2016 1937   UROBILINOGEN 0.2 06/01/2015 0503   NITRITE NEGATIVE 05/25/2016 1937   LEUKOCYTESUR NEGATIVE 05/25/2016 1937   Sepsis Labs: @LABRCNTIP (procalcitonin:4,lacticidven:4) ) Recent Results (from the past 240 hour(s))  MRSA PCR Screening     Status: None   Collection Time: 09/10/16 10:59 PM  Result Value Ref Range Status   MRSA by PCR NEGATIVE NEGATIVE Final    Comment:        The GeneXpert MRSA Assay (FDA approved for NASAL specimens only), is one component of a comprehensive MRSA colonization surveillance program. It is not intended to diagnose MRSA infection nor to guide or monitor treatment for MRSA infections.          Radiology Studies: No results found.    Scheduled Meds: . enoxaparin (LOVENOX) injection  40 mg Subcutaneous Q24H  . FLUoxetine  20 mg Oral Daily  . levETIRAcetam  500 mg Oral BID  . nicotine  21 mg Transdermal Daily   Continuous Infusions: . sodium chloride Stopped (09/12/16 0800)     LOS: 4 days    Time spent in minutes: 35    Eldor Conaway, MD [facility] Pager: [email or web address] Password [email or web address] 09/14/2016, 9:59 AM

## 2016-09-15 DIAGNOSIS — F410 Panic disorder [episodic paroxysmal anxiety] without agoraphobia: Secondary | ICD-10-CM

## 2016-09-15 DIAGNOSIS — I952 Hypotension due to drugs: Secondary | ICD-10-CM | POA: Diagnosis not present

## 2016-09-15 DIAGNOSIS — G40909 Epilepsy, unspecified, not intractable, without status epilepticus: Secondary | ICD-10-CM | POA: Diagnosis not present

## 2016-09-15 DIAGNOSIS — R001 Bradycardia, unspecified: Secondary | ICD-10-CM | POA: Diagnosis not present

## 2016-09-15 DIAGNOSIS — T50902A Poisoning by unspecified drugs, medicaments and biological substances, intentional self-harm, initial encounter: Secondary | ICD-10-CM | POA: Diagnosis not present

## 2016-09-15 MED ORDER — LIP MEDEX EX OINT
TOPICAL_OINTMENT | CUTANEOUS | Status: AC
  Administered 2016-09-15: 1
  Filled 2016-09-15: qty 7

## 2016-09-15 MED ORDER — NICOTINE 21 MG/24HR TD PT24
21.0000 mg | MEDICATED_PATCH | Freq: Every day | TRANSDERMAL | Status: DC
  Administered 2016-09-15 – 2016-09-18 (×4): 21 mg via TRANSDERMAL
  Filled 2016-09-15 (×4): qty 1

## 2016-09-15 NOTE — Progress Notes (Signed)
PROGRESS NOTE    Jaime Nixon  [id number] DOB: [birth year] DOA: 09/10/2016  PCP: Pete Glatterawn T Langeland, MD   Brief Narrative:  Jaime Nixon is a 20 y.o. male with medical history significant of substance abuse and seizure disorder.  He presents via EMS with OD.  He texted his girlfriend and said he was going to kill himself by taking large amounts of keppra, benadryl, xanax and one other pill which was his grandmothers.  Initially lethargic on arrival.  Subjective: No complaints today. He had a panic attack yesteray and required 1mg  oral Ativan and 2mg  IM Ativan.   Assessment & Plan:   Principal Problem:   OD (overdose of drug), intentional self-harm, initial encounter - sitter at bedside - psych eval requested- recommended to be admitted to psych facility- [facility] he has no insurance to go elsewhere  Active Problems:  Panic attack - PRN Ativan-   - pulled out IV again on 11/16 in the middle of a panic attack - have placed him on PRN clonazepam and PRN IM or PO Ativan    Seizure disorder  - resumed Keppra 11/16    Bradycardia - suspected to be drug induced although not sure which drug he took- he was here recently and did not have this issue - was given 2 doses of Ativan to help HR and BP - asymptomatic - HR improved to 70-80s- d/c telemetry    Hypotension  - BP in 90-100 systolic appears to be his baseline   DVT prophylaxis: Lovenox Code Status: Full  Family Communication:  Disposition Plan[facility] has no insurance to go elsewhere Consultants:   psych Procedures:    Antimicrobials:  Anti-infectives    None       Objective: Vitals:   09/14/16 0525 09/14/16 1400 09/14/16 2211 09/15/16 0500  BP: 100/63 (!) 125/54 110/62 (!) 91/48  Pulse: (!) 59 71 (!) 55 (!) 56  Resp: 19 18 18 18   Temp: 97.5 F (36.4 C) 98.1 F (36.7 C) 97.8 F (36.6 C) 97.5 F (36.4 C)  TempSrc: Oral Oral Oral Oral  SpO2: 100% 100% 99%  100%  Weight:      Height:        Intake/Output Summary (Last 24 hours) at 09/15/16 1100 Last data filed at 09/14/16 1330  Gross per 24 hour  Intake              480 ml  Output                0 ml  Net              480 ml   Filed Weights   09/10/16 2200  Weight: 85.7 kg (188 lb 15 oz)    Examination: General exam: Appears comfortable  HEENT: PERRLA, oral mucosa moist, no sclera icterus or thrush Respiratory system: Clear to auscultation. Respiratory effort normal. Cardiovascular system: S1 & S2 heard, RRR.  No murmurs -   Gastrointestinal system: Abdomen soft, non-tender, nondistended. Normal bowel sound. No organomegaly Central nervous system: Alert and oriented. No focal neurological deficits. Extremities: No cyanosis, clubbing or edema Skin: No rashes or ulcers Psychiatry:  Mood & affect appropriate.     Data Reviewed: I have personally reviewed following labs and imaging studies  CBC:  Recent Labs Lab 09/10/16 1120  WBC 9.4  NEUTROABS 6.8  HGB 15.3  HCT 44.0  MCV 89.8  PLT 215  Basic Metabolic Panel:  Recent Labs Lab 09/10/16 1120 09/11/16 0315  NA 137 140  K 4.7 4.0  CL 106 109  CO2 24 27  GLUCOSE 104* 105*  BUN 17 16  CREATININE 0.86 0.92  CALCIUM 9.6 8.9   GFR: Estimated Creatinine Clearance: 142.3 mL/min (by C-G formula based on SCr of 0.92 mg/dL). Liver Function Tests:  Recent Labs Lab 09/10/16 1120  AST 27  ALT 10*  ALKPHOS 56  BILITOT 1.6*  PROT 7.3  ALBUMIN 4.6   No results for input(s): LIPASE, AMYLASE in the last 168 hours. No results for input(s): AMMONIA in the last 168 hours. Coagulation Profile: No results for input(s): INR, PROTIME in the last 168 hours. Cardiac Enzymes: No results for input(s): CKTOTAL, CKMB, CKMBINDEX, TROPONINI in the last 168 hours. BNP (last 3 results) No results for input(s): PROBNP in the last 8760 hours. HbA1C: No results for input(s): HGBA1C in the last 72 hours. CBG: No results for  input(s): GLUCAP in the last 168 hours. Lipid Profile: No results for input(s): CHOL, HDL, LDLCALC, TRIG, CHOLHDL, LDLDIRECT in the last 72 hours. Thyroid Function Tests: No results for input(s): TSH, T4TOTAL, FREET4, T3FREE, THYROIDAB in the last 72 hours. Anemia Panel: No results for input(s): VITAMINB12, FOLATE, FERRITIN, TIBC, IRON, RETICCTPCT in the last 72 hours. Urine analysis:    Component Value Date/Time   COLORURINE YELLOW 05/25/2016 1937   APPEARANCEUR CLEAR 05/25/2016 1937   LABSPEC 1.024 05/25/2016 1937   PHURINE 7.5 05/25/2016 1937   GLUCOSEU NEGATIVE 05/25/2016 1937   HGBUR NEGATIVE 05/25/2016 1937   BILIRUBINUR NEGATIVE 05/25/2016 1937   KETONESUR 15 (A) 05/25/2016 1937   PROTEINUR NEGATIVE 05/25/2016 1937   UROBILINOGEN 0.2 06/01/2015 0503   NITRITE NEGATIVE 05/25/2016 1937   LEUKOCYTESUR NEGATIVE 05/25/2016 1937   Sepsis Labs: @LABRCNTIP (procalcitonin:4,lacticidven:4) ) Recent Results (from the past 240 hour(s))  MRSA PCR Screening     Status: None   Collection Time: 09/10/16 10:59 PM  Result Value Ref Range Status   MRSA by PCR NEGATIVE NEGATIVE Final    Comment:        The GeneXpert MRSA Assay (FDA approved for NASAL specimens only), is one component of a comprehensive MRSA colonization surveillance program. It is not intended to diagnose MRSA infection nor to guide or monitor treatment for MRSA infections.          Radiology Studies: No results found.    Scheduled Meds: . enoxaparin (LOVENOX) injection  40 mg Subcutaneous Q24H  . FLUoxetine  20 mg Oral Daily  . levETIRAcetam  500 mg Oral BID  . nicotine  21 mg Transdermal Daily   Continuous Infusions: . sodium chloride Stopped (09/12/16 0800)     LOS: 5 days    Time spent in minutes: 35    Darey Hershberger, MD [facility] Pager: [email or web address] Password [email or web address] 09/15/2016, 11:00 AM

## 2016-09-16 DIAGNOSIS — I952 Hypotension due to drugs: Secondary | ICD-10-CM | POA: Diagnosis not present

## 2016-09-16 DIAGNOSIS — T50902A Poisoning by unspecified drugs, medicaments and biological substances, intentional self-harm, initial encounter: Secondary | ICD-10-CM | POA: Diagnosis not present

## 2016-09-16 DIAGNOSIS — F410 Panic disorder [episodic paroxysmal anxiety] without agoraphobia: Secondary | ICD-10-CM | POA: Diagnosis not present

## 2016-09-16 DIAGNOSIS — R001 Bradycardia, unspecified: Secondary | ICD-10-CM | POA: Diagnosis not present

## 2016-09-16 NOTE — Progress Notes (Addendum)
Patient information faxed. [facility]-at capacity today, no beds. [facility] beds.  [facility] [place]  [facility] [place]  Will inform medical staff when bed available.   Vivi BarrackNicole Lonzy Mato, Theresia MajorsLCSWA, MSW Clinical Social Worker 5E and Psychiatric Service Line [phone] 09/16/2016  10:10 AM

## 2016-09-16 NOTE — Progress Notes (Signed)
PROGRESS NOTE    Jaime Keenshomas J Yount  ZOX:[id number] DOB: [birth year] DOA: 09/10/2016  PCP: Pete Glatterawn T Langeland, MD   Brief Narrative:  Jaime Nixon is a 20 y.o. male with medical history significant of substance abuse and seizure disorder.  He presents via EMS with OD.  He texted his girlfriend and said he was going to kill himself by taking large amounts of keppra, benadryl, xanax and one other pill which was his grandmothers.  Initially lethargic on arrival.  Subjective: No complaints.  Assessment & Plan:   Principal Problem:   OD (overdose of drug), intentional self-harm, initial encounter - sitter at bedside - psych eval requested- recommended to be admitted to psych facility- [facility] he has no insurance to go elsewhere- stable medically for d/c to Psych but no beds as or yet.   Active Problems:  Panic attack - PRN Ativan-   - pulled out IV again on 11/16 in the middle of a panic attack - have placed him on PRN clonazepam and PRN IM or PO Ativan - cont to follow    Seizure disorder  - resumed Keppra 11/16    Bradycardia - suspected to be drug induced although not sure which drug he took- he was here recently and did not have this issue - was given 2 doses of Ativan to help HR and BP - asymptomatic - HR improved to 70-80s- d/c telemetry    Hypotension  - BP in 90-100 systolic appears to be his baseline   DVT prophylaxis: Lovenox Code Status: Full  Family Communication:  Disposition Plan[facility] he has no insurance to go elsewhere Consultants:   psych Procedures:    Antimicrobials:  Anti-infectives    None       Objective: Vitals:   09/15/16 1702 09/15/16 2055 09/16/16 0628 09/16/16 0644  BP: 105/60 (!) 106/58 (!) 96/44 (!) 100/56  Pulse: 66 67 (!) 51   Resp: 20 20 18    Temp: 98 F (36.7 C) 98.1 F (36.7 C) 97.6 F (36.4 C)   TempSrc: Oral Oral Oral   SpO2: 100% 99% 100%   Weight:      Height:       No intake  or output data in the 24 hours ending 09/16/16 1400 Filed Weights   09/10/16 2200  Weight: 85.7 kg (188 lb 15 oz)    Examination: General exam: Appears comfortable  HEENT: PERRLA, oral mucosa moist, no sclera icterus or thrush Respiratory system: Clear to auscultation. Respiratory effort normal. Cardiovascular system: S1 & S2 heard, RRR.  No murmurs -   Gastrointestinal system: Abdomen soft, non-tender, nondistended. Normal bowel sound. No organomegaly Central nervous system: Alert and oriented. No focal neurological deficits. Extremities: No cyanosis, clubbing or edema Skin: No rashes or ulcers Psychiatry:  Mood & affect appropriate.     Data Reviewed: I have personally reviewed following labs and imaging studies  CBC:  Recent Labs Lab 09/10/16 1120  WBC 9.4  NEUTROABS 6.8  HGB 15.3  HCT 44.0  MCV 89.8  PLT 215   Basic Metabolic Panel:  Recent Labs Lab 09/10/16 1120 09/11/16 0315  NA 137 140  K 4.7 4.0  CL 106 109  CO2 24 27  GLUCOSE 104* 105*  BUN 17 16  CREATININE 0.86 0.92  CALCIUM 9.6 8.9   GFR: Estimated Creatinine Clearance: 142.3 mL/min (by C-G formula based on SCr of 0.92 mg/dL). Liver Function Tests:  Recent  Labs Lab 09/10/16 1120  AST 27  ALT 10*  ALKPHOS 56  BILITOT 1.6*  PROT 7.3  ALBUMIN 4.6   No results for input(s): LIPASE, AMYLASE in the last 168 hours. No results for input(s): AMMONIA in the last 168 hours. Coagulation Profile: No results for input(s): INR, PROTIME in the last 168 hours. Cardiac Enzymes: No results for input(s): CKTOTAL, CKMB, CKMBINDEX, TROPONINI in the last 168 hours. BNP (last 3 results) No results for input(s): PROBNP in the last 8760 hours. HbA1C: No results for input(s): HGBA1C in the last 72 hours. CBG: No results for input(s): GLUCAP in the last 168 hours. Lipid Profile: No results for input(s): CHOL, HDL, LDLCALC, TRIG, CHOLHDL, LDLDIRECT in the last 72 hours. Thyroid Function Tests: No results  for input(s): TSH, T4TOTAL, FREET4, T3FREE, THYROIDAB in the last 72 hours. Anemia Panel: No results for input(s): VITAMINB12, FOLATE, FERRITIN, TIBC, IRON, RETICCTPCT in the last 72 hours. Urine analysis:    Component Value Date/Time   COLORURINE YELLOW 05/25/2016 1937   APPEARANCEUR CLEAR 05/25/2016 1937   LABSPEC 1.024 05/25/2016 1937   PHURINE 7.5 05/25/2016 1937   GLUCOSEU NEGATIVE 05/25/2016 1937   HGBUR NEGATIVE 05/25/2016 1937   BILIRUBINUR NEGATIVE 05/25/2016 1937   KETONESUR 15 (A) 05/25/2016 1937   PROTEINUR NEGATIVE 05/25/2016 1937   UROBILINOGEN 0.2 06/01/2015 0503   NITRITE NEGATIVE 05/25/2016 1937   LEUKOCYTESUR NEGATIVE 05/25/2016 1937   Sepsis Labs: @LABRCNTIP (procalcitonin:4,lacticidven:4) ) Recent Results (from the past 240 hour(s))  MRSA PCR Screening     Status: None   Collection Time: 09/10/16 10:59 PM  Result Value Ref Range Status   MRSA by PCR NEGATIVE NEGATIVE Final    Comment:        The GeneXpert MRSA Assay (FDA approved for NASAL specimens only), is one component of a comprehensive MRSA colonization surveillance program. It is not intended to diagnose MRSA infection nor to guide or monitor treatment for MRSA infections.          Radiology Studies: No results found.    Scheduled Meds: . enoxaparin (LOVENOX) injection  40 mg Subcutaneous Q24H  . FLUoxetine  20 mg Oral Daily  . levETIRAcetam  500 mg Oral BID  . nicotine  21 mg Transdermal Daily   Continuous Infusions: . sodium chloride Stopped (09/12/16 0800)     LOS: 6 days    Time spent in minutes: 35    Quanah Majka, MD [facility] Pager: [email or web address] Password [email or web address] 09/16/2016, 2:00 PM

## 2016-09-17 DIAGNOSIS — R001 Bradycardia, unspecified: Secondary | ICD-10-CM | POA: Diagnosis not present

## 2016-09-17 DIAGNOSIS — I952 Hypotension due to drugs: Secondary | ICD-10-CM | POA: Diagnosis not present

## 2016-09-17 DIAGNOSIS — T50902A Poisoning by unspecified drugs, medicaments and biological substances, intentional self-harm, initial encounter: Secondary | ICD-10-CM | POA: Diagnosis not present

## 2016-09-17 DIAGNOSIS — F410 Panic disorder [episodic paroxysmal anxiety] without agoraphobia: Secondary | ICD-10-CM | POA: Diagnosis not present

## 2016-09-17 MED ORDER — LORAZEPAM 2 MG/ML IJ SOLN
2.0000 mg | Freq: Once | INTRAMUSCULAR | Status: AC
  Administered 2016-09-17: 2 mg via INTRAMUSCULAR

## 2016-09-17 MED ORDER — ACETAMINOPHEN 325 MG PO TABS
650.0000 mg | ORAL_TABLET | ORAL | Status: DC | PRN
  Administered 2016-09-17 – 2016-09-18 (×2): 650 mg via ORAL
  Filled 2016-09-17 (×2): qty 2

## 2016-09-17 NOTE — Progress Notes (Signed)
PROGRESS NOTE    Jaime Nixon  [id number] DOB: 28-Sep-1996 DOA: 09/10/2016  PCP: Pete Glatterawn T Langeland, MD   Brief Narrative:  Jaime Nixon is a 20 y.o. male with medical history significant of substance abuse and seizure disorder.  He presents via EMS with OD.  He texted his girlfriend and said he was going to kill himself by taking large amounts of keppra, benadryl, xanax and one other pill which was his grandmothers.  Initially lethargic on arrival.  Subjective: Called about him running into the elevated trying to run away.   Assessment & Plan:   Principal Problem:   OD (overdose of drug), intentional self-harm, initial encounter - sitter at bedside - psych eval requested- recommended to be admitted to psych facility- [facility] to go [facility]- stable medically for d/c to Psych but no beds as of yet.   Active Problems:  Panic attack - PRN Ativan being given IM 2 mg each time for panic attacks- tries to run out of the hospital when he has an attack   - pulled out IV again on 11/16 in the middle of a panic attack - have placed him on PRN clonazepam   - cont to follow    Seizure disorder  - resumed Keppra 11/16    Bradycardia - suspected to be drug induced although not sure which drug he took- he was here recently and did not have this issue - was given 2 doses of Ativan to help HR and BP - asymptomatic - HR improved to 70-80s- d/c telemetry    Hypotension  - BP in 90-100 systolic appears to be his baseline   DVT prophylaxis: Lovenox Code Status: Full  Family Communication:  [facility] Plan[facility] has [facility] to go elsewhere Consultants:   psych Procedures:    Antimicrobials:  Anti-infectives    None       Objective: Vitals:   09/16/16 1445 09/16/16 2000 09/17/16 0504 09/17/16 1427  BP: 113/70 99/78 98/62  (!) 116/57  Pulse: 68 93 68 70  Resp: 18 16 16 15   Temp: 97.6 F (36.4 C) 98 F  (36.7 C) 97.7 F (36.5 C) 98.7 F (37.1 C)  TempSrc:  Oral Oral Oral  SpO2: 100% 99% 100% 98%  Weight:      Height:        Intake/Output Summary (Last 24 hours) at 09/17/16 1716 Last data filed at 09/17/16 1314  Gross per 24 hour  Intake              480 ml  Output                0 ml  Net              480 ml   Filed Weights   09/10/16 2200  Weight: 85.7 kg (188 lb 15 oz)    Examination: General exam: Appears comfortable  HEENT: PERRLA, oral mucosa moist, no sclera icterus or thrush Respiratory system: Clear to auscultation. Respiratory effort normal. Cardiovascular system: S1 & S2 heard, RRR.  No murmurs -   Gastrointestinal system: Abdomen soft, non-tender, nondistended. Normal bowel sound. No organomegaly Central nervous system: Alert and oriented. No focal neurological deficits. Extremities: No cyanosis, clubbing or edema Skin: No rashes or ulcers Psychiatry:  Mood & affect appropriate.     Data Reviewed: I have personally reviewed following labs and imaging studies  CBC:  No results for input(s): WBC, NEUTROABS, HGB, HCT, MCV, PLT in the last 168 hours. Basic Metabolic Panel:  Recent Labs Lab 09/11/16 0315  NA 140  K 4.0  CL 109  CO2 27  GLUCOSE 105*  BUN 16  CREATININE 0.92  CALCIUM 8.9   GFR: Estimated Creatinine Clearance: 142.3 mL/min (by C-G formula based on SCr of 0.92 mg/dL). Liver Function Tests: No results for input(s): AST, ALT, ALKPHOS, BILITOT, PROT, ALBUMIN in the last 168 hours. No results for input(s): LIPASE, AMYLASE in the last 168 hours. No results for input(s): AMMONIA in the last 168 hours. Coagulation Profile: No results for input(s): INR, PROTIME in the last 168 hours. Cardiac Enzymes: No results for input(s): CKTOTAL, CKMB, CKMBINDEX, TROPONINI in the last 168 hours. BNP (last 3 results) No results for input(s): PROBNP in the last 8760 hours. HbA1C: No results for input(s): HGBA1C in the last 72 hours. CBG: No results for  input(s): GLUCAP in the last 168 hours. Lipid Profile: No results for input(s): CHOL, HDL, LDLCALC, TRIG, CHOLHDL, LDLDIRECT in the last 72 hours. Thyroid Function Tests: No results for input(s): TSH, T4TOTAL, FREET4, T3FREE, THYROIDAB in the last 72 hours. Anemia Panel: No results for input(s): VITAMINB12, FOLATE, FERRITIN, TIBC, IRON, RETICCTPCT in the last 72 hours. Urine analysis:    Component Value Date/Time   COLORURINE YELLOW 05/25/2016 1937   APPEARANCEUR CLEAR 05/25/2016 1937   LABSPEC 1.024 05/25/2016 1937   PHURINE 7.5 05/25/2016 1937   GLUCOSEU NEGATIVE 05/25/2016 1937   HGBUR NEGATIVE 05/25/2016 1937   BILIRUBINUR NEGATIVE 05/25/2016 1937   KETONESUR 15 (A) 05/25/2016 1937   PROTEINUR NEGATIVE 05/25/2016 1937   UROBILINOGEN 0.2 06/01/2015 0503   NITRITE NEGATIVE 05/25/2016 1937   LEUKOCYTESUR NEGATIVE 05/25/2016 1937   Sepsis Labs: @LABRCNTIP (procalcitonin:4,lacticidven:4) ) Recent Results (from the past 240 hour(s))  MRSA PCR Screening     Status: None   Collection Time: 09/10/16 10:59 PM  Result Value Ref Range Status   MRSA by PCR NEGATIVE NEGATIVE Final    Comment:        The GeneXpert MRSA Assay (FDA approved for NASAL specimens only), is one component of a comprehensive MRSA colonization surveillance program. It is not intended to diagnose MRSA infection nor to guide or monitor treatment for MRSA infections.          Radiology Studies: No results found.    Scheduled Meds: . enoxaparin (LOVENOX) injection  40 mg Subcutaneous Q24H  . FLUoxetine  20 mg Oral Daily  . levETIRAcetam  500 mg Oral BID  . nicotine  21 mg Transdermal Daily   Continuous Infusions: . sodium chloride Stopped (09/12/16 0800)     LOS: 7 days    Time spent in minutes: 35    Vernetta Dizdarevic, MD [facility] Pager: [email or web address] Password [email or web address] 09/17/2016, 5:16 PM

## 2016-09-17 NOTE — Progress Notes (Signed)
Pt calm and cooperative with visitors at bedside. Restraints removed at this time. Explained to the pt that restraints would be removed as long as he did not began to interfere with his care. Pt verbalized understanding and remains cooperative at this time.

## 2016-09-17 NOTE — Progress Notes (Signed)
Informed patient's, Grandmother Marylynn PearsonDorothy Marasigan, of his actions and the need to place him in restraints. She stated that it did not surprise her that he behaved in that manor because of how he was when she came to visit him. She stated that [facility] and radiation.

## 2016-09-17 NOTE — Progress Notes (Signed)
Per patient requested LCSWA contact his parole officer [phone]. LCSWA left confidential voicemail. Waiting for return call.   Vivi BarrackNicole Dinisha Cai, Theresia MajorsLCSWA, MSW Clinical Social Worker 5E and Psychiatric Service Line [phone] 09/17/2016  4:27 PM

## 2016-09-17 NOTE — Progress Notes (Signed)
[employer] served patient, IVC paperwork placed on chart.
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
## 2016-09-17 NOTE — Care Management Note (Signed)
Case Management Note  Patient Details  Name: Jaime Nixon MRN: [id number] Date of Birth: 21-Aug-1996  Subjective/Objective:Medically stable awaiting IP Psych-Psych CSW following.                    Action/Plan:d/c IP Psych.   Expected Discharge Date:   (unknown)               Expected Discharge Plan:  Psychiatric Hospital  In-House Referral:  Clinical Social Work  Discharge planning Services  CM Consult  Post Acute Care Choice:    Choice offered to:     DME Arranged:    DME Agency:     HH Arranged:    HH Agency:     Status of Service:  In process, will continue to follow  If discussed at Long Length of Stay Meetings, dates discussed:    Additional Comments:  Lanier ClamMahabir, Bradford Cazier, RN 09/17/2016, 1:03 PM

## 2016-09-17 NOTE — Progress Notes (Signed)
LCSWA met with patient at bedside. Patient was siting in chair and appeared to agitated. Patient reported he wanted to go home and be with his dying grandmother. Patient reported he was loosing his home, has lost his job, and had nothing else to loose. Patient reported he has been cooperative and waited patiently  to see psychiatrist.  LCSWA explained to the patient the psychiatrist might be able to see him. LCSWA explained the disposition process to inpatient psych. Patient reported he did not want to talk to [place] encouraged patient to inform nurse if/when he wanted to talk.   IVC paperwork was completed and refaxed. Magistrate confirmed they received information.   Kathrin Greathouse, Latanya Presser, MSW Clinical Social Worker 5E and Psychiatric Service Line [phone] 09/17/2016  2:55 PM

## 2016-09-18 ENCOUNTER — Inpatient Hospital Stay (HOSPITAL_COMMUNITY)
Admission: AD | Admit: 2016-09-18 | Discharge: 2016-09-20 | DRG: 885 | Disposition: A | Discharge status: Home / Self Care | Payer: Federal, State, Local not specified - Other | Attending: Psychiatry | Admitting: Psychiatry

## 2016-09-18 DIAGNOSIS — Z8489 Family history of other specified conditions: Secondary | ICD-10-CM | POA: Diagnosis not present

## 2016-09-18 DIAGNOSIS — Z79899 Other long term (current) drug therapy: Secondary | ICD-10-CM | POA: Diagnosis not present

## 2016-09-18 DIAGNOSIS — F1210 Cannabis abuse, uncomplicated: Secondary | ICD-10-CM | POA: Diagnosis present

## 2016-09-18 DIAGNOSIS — T50902A Poisoning by unspecified drugs, medicaments and biological substances, intentional self-harm, initial encounter: Secondary | ICD-10-CM | POA: Diagnosis not present

## 2016-09-18 DIAGNOSIS — Z888 Allergy status to other drugs, medicaments and biological substances status: Secondary | ICD-10-CM

## 2016-09-18 DIAGNOSIS — F333 Major depressive disorder, recurrent, severe with psychotic symptoms: Secondary | ICD-10-CM | POA: Diagnosis present

## 2016-09-18 DIAGNOSIS — G40909 Epilepsy, unspecified, not intractable, without status epilepticus: Secondary | ICD-10-CM | POA: Diagnosis present

## 2016-09-18 DIAGNOSIS — F410 Panic disorder [episodic paroxysmal anxiety] without agoraphobia: Secondary | ICD-10-CM | POA: Diagnosis present

## 2016-09-18 DIAGNOSIS — R440 Auditory hallucinations: Secondary | ICD-10-CM | POA: Diagnosis present

## 2016-09-18 DIAGNOSIS — F332 Major depressive disorder, recurrent severe without psychotic features: Principal | ICD-10-CM | POA: Diagnosis present

## 2016-09-18 DIAGNOSIS — F17210 Nicotine dependence, cigarettes, uncomplicated: Secondary | ICD-10-CM | POA: Diagnosis present

## 2016-09-18 DIAGNOSIS — Z915 Personal history of self-harm: Secondary | ICD-10-CM

## 2016-09-18 MED ORDER — TRAZODONE HCL 50 MG PO TABS
50.0000 mg | ORAL_TABLET | Freq: Every evening | ORAL | Status: DC | PRN
  Administered 2016-09-18 – 2016-09-19 (×2): 50 mg via ORAL
  Filled 2016-09-18: qty 14
  Filled 2016-09-18: qty 1
  Filled 2016-09-18: qty 14
  Filled 2016-09-18 (×6): qty 1

## 2016-09-18 MED ORDER — ALUM & MAG HYDROXIDE-SIMETH 200-200-20 MG/5ML PO SUSP: 30.0000 mL | ORAL | Status: DC | PRN

## 2016-09-18 MED ORDER — LEVETIRACETAM 500 MG PO TABS
500.0000 mg | ORAL_TABLET | Freq: Two times a day (BID) | ORAL | Status: DC
  Administered 2016-09-18 – 2016-09-20 (×4): 500 mg via ORAL
  Filled 2016-09-18: qty 1
  Filled 2016-09-18: qty 14
  Filled 2016-09-18 (×3): qty 1
  Filled 2016-09-18: qty 14
  Filled 2016-09-18 (×4): qty 1

## 2016-09-18 MED ORDER — FLUOXETINE HCL 20 MG PO CAPS
20.0000 mg | ORAL_CAPSULE | Freq: Every day | ORAL | Status: DC
  Administered 2016-09-19 – 2016-09-20 (×2): 20 mg via ORAL
  Filled 2016-09-18 (×4): qty 1
  Filled 2016-09-18: qty 7

## 2016-09-18 MED ORDER — FLUOXETINE HCL 20 MG PO CAPS: 20.0000 mg | ORAL_CAPSULE | Freq: Every day | ORAL | 0 refills | Status: DC

## 2016-09-18 MED ORDER — MAGNESIUM HYDROXIDE 400 MG/5ML PO SUSP: 30.0000 mL | Freq: Every day | ORAL | Status: DC | PRN

## 2016-09-18 MED ORDER — HYDROXYZINE HCL 25 MG PO TABS
25.0000 mg | ORAL_TABLET | Freq: Three times a day (TID) | ORAL | Status: DC | PRN
  Administered 2016-09-18 – 2016-09-20 (×3): 25 mg via ORAL
  Filled 2016-09-18: qty 1
  Filled 2016-09-18: qty 10
  Filled 2016-09-18 (×2): qty 1

## 2016-09-18 MED ORDER — ACETAMINOPHEN 325 MG PO TABS
650.0000 mg | ORAL_TABLET | Freq: Four times a day (QID) | ORAL | Status: DC | PRN
Start: 2016-09-18 — End: 2016-09-20

## 2016-09-18 NOTE — Progress Notes (Signed)
[facility] Patient will transport by [employer]. IVC information faxed.  Patient and family notified.  Patient will transport after 5:00pm. RN notified to call for transport.  IVC papers on chart.   Vivi BarrackNicole Dennie Vecchio, Theresia MajorsLCSWA, MSW Clinical Social Worker 5E and Psychiatric Service Line [phone] 09/18/2016  3:31 PM
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
## 2016-09-18 NOTE — Progress Notes (Signed)
[facility] give [facility] today.  Patient transported by [employer] with 5 bags of personal belongings.

## 2016-09-18 NOTE — Progress Notes (Signed)
Jaime Nixon is a 20 year old male being admitted involuntarily to [facility] from [facility]-med floor.  She was medically hospitalized after intentional OD on multiple medications which resulted him him having hypotension and bradycardia.  He reported multiple stressors as loss of job, relationship issues, financial problems and legal problems.  He was an elopement risk at the ED as he tried to leave multiple times.  "I am feeling better since I was in the hospital, I just want to leave.  I got to talk with all the different sitters and that really helped me."  He denies any medical issues and appears to be in no physical distress.  He is diagnosed with Major Depressive disorder, cocaine use, sedative,hypnotic and anxiolytic use.  Oriented him to the unit.  Admission paperwork completed and signed.  Belongings searched and secured in locker # 28.  Skin assessment completed and no skin issues noted.  Q 15 minute checks initiated for safety.  We will monitor the progress towards his goals.
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
## 2016-09-18 NOTE — Discharge Summary (Signed)
Discharge Summary  Jaime Keenshomas J Nasworthy UJW:[id number]:[id number] DOB: 05/01/1996  PCP: Pete Glatterawn T Langeland, MD  Admit date: 09/10/2016 Discharge date: 09/18/2016  Time spent: <5130mins  Patient is discharged to behavioral health inpatient facility  Discharge Diagnoses:  Active Hospital Problems   Diagnosis Date Noted  . OD (overdose of drug), intentional self-harm, initial encounter (HCC) 09/10/2016  . Panic attacks 09/15/2016  . Bradycardia   . Bradycardia, drug induced 09/10/2016  . Hypotension due to drugs 09/10/2016  . Overdose 09/10/2016  . Seizure disorder (HCC) 05/26/2016    Resolved Hospital Problems   Diagnosis Date Noted Date Resolved  No resolved problems to display.    Discharge Condition: stable  Diet recommendation: regular diet  Filed Weights   09/10/16 2200  Weight: 85.7 kg (188 lb 15 oz)    History of present illness:  HPI: Jaime Nixon is a 20 y.o. male with medical history significant of substance abuse and seizure disorder.  He presents via EMS with OD.  He texted his girlfriend and said he was going to kill himself by taking large amounts of keppra, benadryl, and xanax.  Patient also admits to taking unknown medication of his grandmothers.  Grandma is on BP pills.  Initially lethargic on arrival.  ED Course: Patient is more alert since arrival; however, BP has dropped to the 70s and he became bradycardic to the 40s.  Has been given IVF, and 2 rounds of atropine thus far with improvement in HR and BP.  Hospital Course:  Principal Problem:   OD (overdose of drug), intentional self-harm, initial encounter [facility](HCC) Active Problems:   Seizure disorder (HCC)   Bradycardia, drug induced   Hypotension due to drugs   Overdose   Bradycardia   Panic attacks   OD (overdose of drug), intentional self-harm, initial encounter - sitter at bedside - psych eval requested- recommended to be admitted to psych facility- [facility] he has no insurance to go  elsewhere- stable medically for d/c to inpatient psych on 11/22 He is started on prozac per psych recommendation  Active Problems:  Panic attack - PRN Ativan being given IM 2 mg each time for panic attacks- tries to run out of the hospital when he has an attack   - pulled out IV again on 11/16 in the middle of a panic attack - have placed him on PRN clonazepam   - in patient psych placement    Seizure disorder , report h/o subdural hematoma, report family history of seizure as well - resumed Keppra 11/16 -no seizure in the last 4 months    Bradycardia, resovled - suspected to be drug induced although not sure which drug he took- he was here recently and did not have this issue -  asymptomatic - HR improved to 70-80s- d/c telemetry    Hypotension  - sBP in 90-100 systolic appears to be his baseline, asymptomatic   DVT prophylaxis while in the hospital: Lovenox Code Status: Full  Family Communication: patieint and her grandmother in room [facility] has no insurance to go elsewhere Consultants:   psych  Antimicrobials:     Anti-infectives    None       Procedures:  none  Consultations:  psychiatry  Discharge Exam: BP 116/73 (BP Location: Left Arm)   Pulse 92   Temp 98.1 F (36.7 C) (Oral)   Resp 20   Ht 6\' 1"  (1.854 m)   Wt 85.7 kg (188  lb 15 oz)   SpO2 99%   BMI 24.93 kg/m   General: NAD Cardiovascular: RRR Respiratory: CTABL  Discharge Instructions You were cared for by a hospitalist during your hospital stay. If you have any questions about your discharge medications or the care you received while you were in the hospital after you are discharged, you can call the unit and asked to speak with the hospitalist on call if the hospitalist that took care of you is not available. Once you are discharged, your primary care physician will handle any further medical issues. Please note that NO REFILLS for any discharge  medications will be authorized once you are discharged, as it is imperative that you return to your primary care physician (or establish a relationship with a primary care physician if you do not have one) for your aftercare needs so that they can reassess your need for medications and monitor your lab values.  Discharge Instructions    Diet general    Complete by:  As directed    Increase activity slowly    Complete by:  As directed        Medication List    TAKE these medications   FLUoxetine 20 MG capsule Commonly known as:  PROZAC Take 1 capsule (20 mg total) by mouth daily. Start taking on:  09/19/2016   levETIRAcetam 500 MG tablet Commonly known as:  KEPPRA Take 1 tablet (500 mg total) by mouth 2 (two) times daily.      Allergies  Allergen Reactions  . Dexamethasone Shortness Of Breath and Other (See Comments)    Reaction:  Makes pt angry   . Prednisone Other (See Comments)    Reaction:  Makes pt angry       The results of significant diagnostics from this hospitalization (including imaging, microbiology, ancillary and laboratory) are listed below for reference.    Significant Diagnostic Studies: No results found.  Microbiology: Recent Results (from the past 240 hour(s))  MRSA PCR Screening     Status: None   Collection Time: 09/10/16 10:59 PM  Result Value Ref Range Status   MRSA by PCR NEGATIVE NEGATIVE Final    Comment:        The GeneXpert MRSA Assay (FDA approved for NASAL specimens only), is one component of a comprehensive MRSA colonization surveillance program. It is not intended to diagnose MRSA infection nor to guide or monitor treatment for MRSA infections.      Labs: Basic Metabolic Panel: No results for input(s): NA, K, CL, CO2, GLUCOSE, BUN, CREATININE, CALCIUM, MG, PHOS in the last 168 hours. Liver Function Tests: No results for input(s): AST, ALT, ALKPHOS, BILITOT, PROT, ALBUMIN in the last 168 hours. No results for input(s):  LIPASE, AMYLASE in the last 168 hours. No results for input(s): AMMONIA in the last 168 hours. CBC: No results for input(s): WBC, NEUTROABS, HGB, HCT, MCV, PLT in the last 168 hours. Cardiac Enzymes: No results for input(s): CKTOTAL, CKMB, CKMBINDEX, TROPONINI in the last 168 hours. BNP: BNP (last 3 results) No results for input(s): BNP in the last 8760 hours.  ProBNP (last 3 results) No results for input(s): PROBNP in the last 8760 hours.  CBG: No results for input(s): GLUCAP in the last 168 hours.     SignedAlbertine Grates:  Leeana Creer MD, PhD  [facility] 09/18/2016, 5:51 PM

## 2016-09-19 ENCOUNTER — Encounter (HOSPITAL_COMMUNITY): Payer: Self-pay

## 2016-09-19 DIAGNOSIS — F1210 Cannabis abuse, uncomplicated: Secondary | ICD-10-CM

## 2016-09-19 DIAGNOSIS — F333 Major depressive disorder, recurrent, severe with psychotic symptoms: Secondary | ICD-10-CM

## 2016-09-19 DIAGNOSIS — Z8489 Family history of other specified conditions: Secondary | ICD-10-CM

## 2016-09-19 DIAGNOSIS — Z888 Allergy status to other drugs, medicaments and biological substances status: Secondary | ICD-10-CM

## 2016-09-19 DIAGNOSIS — Z79899 Other long term (current) drug therapy: Secondary | ICD-10-CM

## 2016-09-19 LAB — LIPID PANEL
Cholesterol: 169 mg/dL (ref 0–200)
HDL: 32 mg/dL — ABNORMAL LOW (ref >40)
LDL CALC: 118 mg/dL — AB (ref 0–99)
Total CHOL/HDL Ratio: 5.3 RATIO
Triglycerides: 97 mg/dL (ref <150)
VLDL: 19 mg/dL (ref 0–40)

## 2016-09-19 LAB — TSH: TSH: 0.725 u[IU]/mL (ref 0.350–4.500)

## 2016-09-19 MED ORDER — NICOTINE POLACRILEX 2 MG MT GUM
2.0000 mg | CHEWING_GUM | OROMUCOSAL | Status: DC | PRN
  Administered 2016-09-20: 2 mg via ORAL
  Filled 2016-09-19: qty 1

## 2016-09-19 NOTE — H&P (Signed)
Psychiatric Admission Assessment Adult  Patient Identification: Jaime Nixon Bookwalter MRN:  [id number] Date of Evaluation:  09/19/2016 Chief Complaint:  MDD Principal Diagnosis: MDD (major depressive disorder), recurrent episode, severe (HCC) Diagnosis:   Patient Active Problem List   Diagnosis Date Noted  . Cannabis abuse [F12.10]   . MDD (major depressive disorder), recurrent episode, severe (HCC) [F33.2] 09/18/2016  . Panic attacks [F41.0] 09/15/2016  . Bradycardia [R00.1]   . OD (overdose of drug), intentional self-harm, initial encounter (HCC) [T50.902A] 09/10/2016  . Bradycardia, drug induced [R00.1, T50.905A] 09/10/2016  . Hypotension due to drugs [I95.2] 09/10/2016  . Overdose [T50.901A] 09/10/2016  . Generalized abdominal pain [R10.84]   . Hyperkalemia [E87.5]   . Uncontrollable vomiting [R11.10]   . Seizure disorder (HCC) [G40.909] 05/26/2016  . Cannabinoid hyperemesis syndrome (HCC) [Z30.865][F12.988] 05/26/2016  . Hypokalemia due to loss of potassium [E87.6] 05/26/2016  . Tobacco dependence [F17.200] 05/26/2016  . Nausea and vomiting [R11.2] 05/26/2016  . Leukocytosis [D72.829] 12/27/2015  . Abnormal finding on MRI of brain [R90.89]   . 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 male presenting due to intentional overdose of "Xanax, Benadryl, seizure medication" and additional unknown prescription.  Patient unsure of quantities and dosages consumed.  Upon admission, he stated the overdose was a suicide attempt.  Patient reports daily THC and Xanax ("footballs") use. Pt denies regular alcohol use.  His UDS + Benzos, +Cocaine Pt BAL <5  He has a history of seizure disorder and is on Keppra.  He states that it was "dumb thing to do.  I'm young and there will be harder times ahead, killing yourself is not the answer.  I'd really like to go home, my grandmother is sick and this may be the last time I get to see her."   Patient denies previous  psychiatric medications or past hospitalizations.  Patient reports multiple stressors including current probation for domestic violence, probation violation, wife leaving unexpectedly with child, job loss, grandmother having cancer, "my father can't walk", and risk of losing home.   Patient denies suicidal and homicidal ideation. Patient reports auditory hallucinations with command to harm himself.  He also reported a tendency to react violently when under stress.  Associated Signs/Symptoms: Depression Symptoms:  depressed mood, fatigue, anxiety, (Hypo) Manic Symptoms:  Elevated Mood, Anxiety Symptoms:  Excessive Worry, Psychotic Symptoms:  NA PTSD Symptoms: NA Total Time spent with patient: 45 minutes  Past Psychiatric History: see HPI  Is the patient at risk to self? No.  Has the patient been a risk to self in the past 6 months? No.  Has the patient been a risk to self within the distant past? No.  Is the patient a risk to others? No.  Has the patient been a risk to others in the past 6 months? No.  Has the patient been a risk to others within the distant past? No.   Prior Inpatient Therapy:   Prior Outpatient Therapy:    Alcohol Screening: 1. How often do you have a drink containing alcohol?: Never 9. Have you or someone else been injured as a result of your drinking?: No 10. Has a relative or friend or a doctor or another health worker been concerned about your drinking or suggested you cut down?: No Alcohol Use Disorder Identification Test Final Score (AUDIT): 0 Brief Intervention: AUDIT score less than 7 or less-screening does not suggest unhealthy drinking-brief intervention not indicated Substance Abuse History in the  last 12 months:  Yes.   Consequences of Substance Abuse: crisis management Previous Psychotropic Medications: Yes  Psychological Evaluations: No  Past Medical History:  Past Medical History:  Diagnosis Date  . Current smoker   . History of substance use    . Seizures (HCC)    History reviewed. No pertinent surgical history. Family History:  Family History  Problem Relation Age of Onset  . Seizures Father   . Seizures Brother    Family Psychiatric  History: see HPI Tobacco Screening: Have you used any form of tobacco in the last 30 days? (Cigarettes, Smokeless Tobacco, Cigars, and/or Pipes): Yes Tobacco use, Select all that apply: 5 or more cigarettes per day Are you interested in Tobacco Cessation Medications?: Yes, will notify MD for an order Counseled patient on smoking cessation including recognizing danger situations, developing coping skills and basic information about quitting provided: Refused/Declined practical counseling Social History:  History  Alcohol Use  . Yes    Comment: rare use, but binges occasionally     History  Drug Use  . Types: Marijuana    Comment: every other day    Additional Social History:      Pain Medications: Pt denies abuse Prescriptions: daily Xanax use "school buses" Over the Counter: Pt denies abuse History of alcohol / drug use?: Yes Name of Substance 1: THC 1 - Age of First Use: 10 1 - Amount (size/oz): 3-4 blunts 1 - Frequency: daily 1 - Duration: ongoing 1 - Last Use / Amount: 11.13.17/Not Reported    Allergies:   Allergies  Allergen Reactions  . Dexamethasone Shortness Of Breath and Other (See Comments)    Reaction:  Makes pt angry   . Prednisone Other (See Comments)    Reaction:  Makes pt angry    Lab Results:  Results for orders placed or performed during the hospital encounter of 09/18/16 (from the past 48 hour(s))  Lipid panel     Status: Abnormal   Collection Time: 09/19/16  6:17 AM  Result Value Ref Range   Cholesterol 169 0 - 200 mg/dL   Triglycerides 97 <409 mg/dL   HDL 32 (L) >81 mg/dL   Total CHOL/HDL Ratio 5.3 RATIO   VLDL 19 0 - 40 mg/dL   LDL Cholesterol 191 (H) 0 - 99 mg/dL    Comment:        Total Cholesterol/HDL:CHD Risk Coronary Heart Disease Risk  Table                     Men   Women  1/2 Average Risk   3.4   3.3  Average Risk       5.0   4.4  2 X Average Risk   9.6   7.1  3 X Average Risk  23.4   11.0        Use the calculated Patient Ratio above and the CHD Risk Table to determine the patient's CHD Risk.        ATP III CLASSIFICATION (LDL):  <100     mg/dL   Optimal  478-295  mg/dL   Near or Above                    Optimal  130-159  mg/dL   Borderline  621-308  mg/dL   High  >657     mg/dL   Very High Performed at [facility]   TSH     Status: None   Collection  Time: 09/19/16  6:17 AM  Result Value Ref Range   TSH 0.725 0.350 - 4.500 uIU/mL    Comment: Performed by a 3rd Generation assay with a functional sensitivity of <=0.01 uIU/mL. Performed at [facility]     Blood Alcohol level:  Lab Results  Component Value Date   [facility] <5 09/10/2016   ETH <5 05/23/2016    Metabolic Disorder Labs:  Lab Results  Component Value Date   HGBA1C 5.3 01/01/2016   No results found for: PROLACTIN Lab Results  Component Value Date   CHOL 169 09/19/2016   TRIG 97 09/19/2016   HDL 32 (L) 09/19/2016   CHOLHDL 5.3 09/19/2016   VLDL 19 09/19/2016   LDLCALC 118 (H) 09/19/2016    Current Medications: Current Facility-Administered Medications  Medication Dose Route Frequency Provider Last Rate Last Dose  . acetaminophen (TYLENOL) tablet 650 mg  650 mg Oral Q6H PRN Truman Hayward, FNP      . alum & mag hydroxide-simeth (MAALOX/MYLANTA) 200-200-20 MG/5ML suspension 30 mL  30 mL Oral Q4H PRN Truman Hayward, FNP      . FLUoxetine (PROZAC) capsule 20 mg  20 mg Oral Daily Truman Hayward, FNP   20 mg at 09/19/16 1041  . hydrOXYzine (ATARAX/VISTARIL) tablet 25 mg  25 mg Oral TID PRN Truman Hayward, FNP   25 mg at 09/18/16 2151  . levETIRAcetam (KEPPRA) tablet 500 mg  500 mg Oral BID Truman Hayward, FNP   500 mg at 09/19/16 1041  . magnesium hydroxide (MILK OF MAGNESIA) suspension 30 mL  30 mL Oral  Daily PRN Truman Hayward, FNP      . traZODone (DESYREL) tablet 50 mg  50 mg Oral QHS,MR X 1 Truman Hayward, FNP   50 mg at 09/18/16 2151   PTA Medications: Prescriptions Prior to Admission  Medication Sig Dispense Refill Last Dose  . FLUoxetine (PROZAC) 20 MG capsule Take 1 capsule (20 mg total) by mouth daily. 30 capsule 0   . levETIRAcetam (KEPPRA) 500 MG tablet Take 1 tablet (500 mg total) by mouth 2 (two) times daily. 60 tablet 0 09/09/2016 at Unknown time    Musculoskeletal: Strength & Muscle Tone: within normal limits Gait & Station: normal Patient leans: N/A  Psychiatric Specialty Exam: Physical Exam  ROS  Blood pressure 112/81, pulse 91, temperature 97.6 F (36.4 C), temperature source Oral, resp. rate 16, height 6' (1.829 m), weight 78 kg (172 lb), SpO2 100 %.Body mass index is 23.33 kg/m.  General Appearance: Casual  Eye Contact:  Good  Speech:  Clear and Coherent  Volume:  Normal  Mood:  Anxious  Affect:  Appropriate  Thought Process:  Goal Directed  Orientation:  Full (Time, Place, and Person)  Thought Content:  Rumination  Suicidal Thoughts:  No  Homicidal Thoughts:  No  Memory:  Immediate;   Fair Recent;   Fair Remote;   Fair  Judgement:  Fair  Insight:  Fair  Psychomotor Activity:  Normal  Concentration:  Concentration: Fair and Attention Span: Fair  Recall:  [employer] of Knowledge:  Fair  Language:  Fair  Akathisia:  Negative  Handed:  Right  AIMS (if indicated):     Assets:  Desire for Improvement  ADL's:  Intact  Cognition:  WNL  Sleep:  Number of Hours: 6.75   Treatment Plan Summary: Admit for crisis management and mood stabilization. Medication management to re-stabilize current mood symptoms Group counseling sessions for  coping skills Medical consults as needed Review and reinstate any pertinent home medications for other health problems  Observation Level/Precautions:  15 minute checks  Laboratory:  per ED  Psychotherapy:  group   Medications:  Prozac 20 mg daily depression, Trazodone 50 mg daily insomnia, Vistaril 25 mg PRN anxiety  Consultations:  As needed  Discharge Concerns:  safety  Estimated LOS:  2-7 days  Other:     Physician Treatment Plan for Primary Diagnosis: MDD (major depressive disorder), recurrent episode, severe (HCC) Long Term Goal(s): Improvement in symptoms so as ready for discharge  Short Term Goals: Ability to identify changes in lifestyle to reduce recurrence of condition will improve, Ability to verbalize feelings will improve, Ability to disclose and discuss suicidal ideas, Ability to demonstrate self-control will improve, Ability to identify and develop effective coping behaviors will improve, Ability to maintain clinical measurements within normal limits will improve, Compliance with prescribed medications will improve and Ability to identify triggers associated with substance abuse/mental health issues will improve  Physician Treatment Plan for Secondary Diagnosis: Principal Problem:   MDD (major depressive disorder), recurrent episode, severe (HCC) Active Problems:   Cannabis abuse  Long Term Goal(s): Improvement in symptoms so as ready for discharge  Short Term Goals: Ability to identify changes in lifestyle to reduce recurrence of condition will improve, Ability to verbalize feelings will improve, Ability to disclose and discuss suicidal ideas, Ability to demonstrate self-control will improve, Ability to identify and develop effective coping behaviors will improve, Ability to maintain clinical measurements within normal limits will improve, Compliance with prescribed medications will improve and Ability to identify triggers associated with substance abuse/mental health issues will improve  I certify that inpatient services furnished can reasonably be expected to improve the patient's condition.    Lindwood QuaSheila May Alka Falwell, NP [facility] 11/23/201711:14 AM

## 2016-09-19 NOTE — Plan of Care (Signed)
Problem: Education: Goal: Ability to make informed decisions regarding treatment will improve Outcome: Progressing Patient has been taking medications as prescribed.

## 2016-09-19 NOTE — [facility] Group Notes (Signed)
[facility] Group Notes:  (Nursing/MHT/Case Management/Adjunct)  Date:  09/19/2016  Time:  3:50 PM  Type of Therapy:  Nurse Education  Participation Level:  Did not attend  Almira Barenny G Valyncia Wiens 09/19/2016, 3:50 PM

## 2016-09-19 NOTE — Progress Notes (Signed)
Psychoeducational Group Note  Date:  09/19/2016 Time:  2322  Group Topic/Focus:  Wrap-Up Group:   The focus of this group is to help patients review their daily goal of treatment and discuss progress on daily workbooks.   Participation Level: Did Not Attend  Participation Quality:  Not Applicable  Affect:  Not Applicable  Cognitive:  Not Applicable  Insight:  Not Applicable  Engagement in Group: Not Applicable  Additional Comments:  The patient did not attend group this evening.  Hazle CocaGOODMAN, Justin Meisenheimer S 09/19/2016, 11:22 PM

## 2016-09-19 NOTE — Tx Team (Signed)
Initial Treatment Plan 09/19/2016 12:00 AM Jaime Keenshomas J Swander ZOX:[id number]    PATIENT STRESSORS: Financial difficulties Legal issue  Marital conflict    PATIENT STRENGTHS: Wellsite geologistCommunication skills General fund of knowledge Physical Health   PATIENT IDENTIFIED PROBLEMS: Depression  Suicide attempt  Anxiety  Substance abuse  "Going home"  "Not come back here"           DISCHARGE CRITERIA:  Improved stabilization in mood, thinking, and/or behavior Verbal commitment to aftercare and medication compliance Withdrawal symptoms are absent or subacute and managed without 24-hour nursing intervention  PRELIMINARY DISCHARGE PLAN: Outpatient therapy Medication management  PATIENT/FAMILY INVOLVEMENT: This treatment plan has been presented to and reviewed with the patient, Jaime Nixon.  The patient and family have been given the opportunity to ask questions and make suggestions.  Levin BaconHeather V Mackensi Mahadeo, RN 09/19/2016, 12:00 AM

## 2016-09-19 NOTE — Progress Notes (Signed)
Data. Patient denies SI/HI/AVH. Patient interacting well with staff and other patients.atient has been noted pacing up and down the halls. When this is discussed with patient he states, "I am used to being outside working all the time. I find it hard to sit still".  On his self assessment patient reports 0/10 for depression, anxiety and hopelessness. His goal is: Going home spending Thanksgiving with my family".  Action. Emotional support and encouragement offered. Education provided on medication, indications and side effect. Q 15 minute checks done for safety. Response. Safety on the unit maintained through 15 minute checks.  Medications taken as prescribed. Attended groups. Remained calm and appropriate through out shift.

## 2016-09-19 NOTE — [facility] Suicide Risk Assessment (Signed)
[facility]   Nursing information obtained from:  Patient Demographic factors:  Male, Caucasian, Low socioeconomic status, Unemployed Current Mental Status:  NA Loss Factors:  Loss of significant relationship, Legal issues, Financial problems / change in socioeconomic status Historical Factors:  NA Risk Reduction Factors:  NA  Total Time spent with patient: 45 minutes Principal Problem: MDD (major depressive disorder), recurrent episode, severe (HCC) Diagnosis:   Patient Active Problem List   Diagnosis Date Noted  . MDD (major depressive disorder), recurrent episode, severe (HCC) [F33.2] 09/18/2016  . Panic attacks [F41.0] 09/15/2016  . Bradycardia [R00.1]   . OD (overdose of drug), intentional self-harm, initial encounter (HCC) [T50.902A] 09/10/2016  . Bradycardia, drug induced [R00.1, T50.905A] 09/10/2016  . Hypotension due to drugs [I95.2] 09/10/2016  . Overdose [T50.901A] 09/10/2016  . Generalized abdominal pain [R10.84]   . Hyperkalemia [E87.5]   . Uncontrollable vomiting [R11.10]   . Seizure disorder (HCC) [G40.909] 05/26/2016  . Cannabinoid hyperemesis syndrome (HCC) [Z61.096][F12.988] 05/26/2016  . Hypokalemia due to loss of potassium [E87.6] 05/26/2016  . Tobacco dependence [F17.200] 05/26/2016  . Nausea and vomiting [R11.2] 05/26/2016  . Leukocytosis [D72.829] 12/27/2015  . Abnormal finding on MRI of brain [R90.89]   . 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  Patient endorsed multiple stressors including breakup with girlfriend, having legal issues, lost his job.  He was feeling worthless and hopeless.  His UDS is positive for cannabis.  Patient requires inpatient treatment for stabilization.  Please see history and physical for more details.  Continued Clinical Symptoms:  Alcohol Use Disorder Identification Test Final Score (AUDIT):  0 The "Alcohol Use Disorders Identification Test", Guidelines for Use in Primary Care, Second Edition.  World Science writerHealth Organization [employer](WHO). Score between 0-7:  no or low risk or alcohol related problems. Score between 8-15:  moderate risk of alcohol related problems. Score between 16-19:  high risk of alcohol related problems. Score 20 or above:  warrants further diagnostic evaluation for alcohol dependence and treatment.   CLINICAL FACTORS:   Depression:   Anhedonia Hopelessness Impulsivity Insomnia Alcohol/Substance Abuse/Dependencies Unstable or Poor Therapeutic Relationship   Musculoskeletal: Strength & Muscle Tone: within normal limits Gait & Station: normal Patient leans: N/A  Psychiatric Specialty Exam: Physical Exam  ROS  Blood pressure 112/81, pulse 91, temperature 97.6 F (36.4 C), temperature source Oral, resp. rate 16, height 6' (1.829 m), weight 78 kg (172 lb), SpO2 100 %.Body mass index is 23.33 kg/m.  General Appearance: Fairly Groomed and Guarded  Eye Contact:  Fair  Speech:  Slow  Volume:  Decreased  Mood:  Depressed and Dysphoric  Affect:  Constricted and Depressed  Thought Process:  Coherent  Orientation:  Full (Time, Place, and Person)  Thought Content:  Rumination  Suicidal Thoughts:  Yes.  with intent/plan  Homicidal Thoughts:  No  Memory:  Immediate;   Fair Recent;   Good Remote;   Good  Judgement:  Impaired  Insight:  Lacking  Psychomotor Activity:  Decreased  Concentration:  Concentration: Fair and Attention Span: Fair  Recall:  [employer]  Fund of Knowledge:  Good  Language:  Good  Akathisia:  No  Handed:  Right  AIMS (if indicated):     Assets:  Housing Physical Health  ADL's:  Intact  Cognition:  WNL  Sleep:  Number of Hours: 6.75      COGNITIVE FEATURES THAT CONTRIBUTE TO RISK:  Loss of executive function, Polarized thinking and Thought constriction (tunnel vision)    SUICIDE RISK:   Moderate:  Frequent suicidal ideation with  limited intensity, and duration, some specificity in terms of plans, no associated intent, good self-control, limited dysphoria/symptomatology, some risk factors present, and identifiable protective factors, including available and accessible social support.   PLAN OF CARE: Patient is 20 year old Caucasian single, employed man who was admitted due to severe depression and plan to kill himself.  Patient took overdose on his medication.  We will start antidepressant and encouraged him to participate in group milieu therapy.  His UDS is positive for cannabis.  We will encourage him to participate in substance abuse classes.  Please see history and physical for more detailed treatment plan.  I certify that inpatient services furnished can reasonably be expected to improve the patient's condition.  Quindell Shere T., MD 09/19/2016, 9:34 AM

## 2016-09-20 LAB — HEMOGLOBIN A1C
Hgb A1c MFr Bld: 4.9 % (ref 4.8–5.6)
MEAN PLASMA GLUCOSE: 94 mg/dL

## 2016-09-20 MED ORDER — FLUOXETINE HCL 20 MG PO CAPS: 20.0000 mg | ORAL_CAPSULE | Freq: Every day | ORAL | 0 refills | Status: DC

## 2016-09-20 MED ORDER — TRAZODONE HCL 50 MG PO TABS: ORAL_TABLET | ORAL | 0 refills | Status: DC

## 2016-09-20 MED ORDER — TRAZODONE HCL 50 MG PO TABS
50.0000 mg | ORAL_TABLET | Freq: Every day | ORAL | Status: DC
  Filled 2016-09-20: qty 7

## 2016-09-20 MED ORDER — LEVETIRACETAM 500 MG PO TABS: 500.0000 mg | ORAL_TABLET | Freq: Two times a day (BID) | ORAL | 0 refills | Status: DC

## 2016-09-20 MED ORDER — HYDROXYZINE HCL 25 MG PO TABS: 25.0000 mg | ORAL_TABLET | Freq: Three times a day (TID) | ORAL | 0 refills | Status: DC | PRN

## 2016-09-20 MED ORDER — NICOTINE POLACRILEX 2 MG MT GUM: 2.0000 mg | CHEWING_GUM | OROMUCOSAL | 0 refills | Status: DC | PRN

## 2016-09-20 NOTE — Progress Notes (Signed)
  [facility] :  Will you be returning to the same living situation after discharge:  Yes,  return home with his grandmother and father. At discharge, do you have transportation home?: Yes,  family friend coming after lunch to pick him up.  Do you have the ability to pay for your medications: Yes,  mental health  Release of information consent forms completed and submitted to medical records by CSW.  Patient to Follow up at: Follow-up Information    [facility] Follow up.   Specialty:  Professional Counselor Why:  Walk in between 8am-12pm Monday through Friday for hospital follow-up/medication management/assessment for counseling services. Please resume pre-scheduled substance abuse classes upon discharge. Thank you.  Contact information: [address] [place] [id number] [phone]           Next level of care provider has [facility] to [facility] Link:no  Safety Planning and Suicide Prevention discussed: Yes,  SPE completed with pt; pt declined to consent to family contact. SPI pamphlet and mobile crisis information provided.  Have you used any form of tobacco in the last 30 days? (Cigarettes, Smokeless Tobacco, Cigars, and/or Pipes): Yes  Has patient been referred to the Quitline?: Patient refused referral  Patient has been referred for addiction treatment: Yes  Angeliz Settlemyre N Smart LCSW 09/20/2016, 10:17 AM

## 2016-09-20 NOTE — Progress Notes (Signed)
D: Pt at the time of assessment denies any form of depression, anxiety, pain, SI, HI or AVH; states, "I have learnt a lot since I got to the hospital; I don't know what I was thing; I am very grateful for everything." Pt was observed interaction with peers and staffs. Pt remained calm and cooperative. A: Medications offered as prescribed.  Support, encouragement, and safe environment provided.  15-minute safety checks continue. R: Pt was med compliant.  Pt attended wrap-up group. Safety checks continues.

## 2016-09-20 NOTE — Progress Notes (Signed)
Data. Patient denies SI/HI/AVH. Patient interacting well with staff and other patients. Affect is bright and patient has been appropriate and calm on the unit. He has not been pacing this shift. He reports feeling, "Excited and happy", about going home today. On his self assessment he reports 0/10 for anxiety, depression and hopelessness. His goal for today is: "Going home. Making a new start".  Action. Emotional support and encouragement offered. Education provided on medication, indications and side effect. Q 15 minute checks done for safety. Response. Safety on the unit maintained through 15 minute checks.  Medications taken as prescribed. Attended groups. Remained calm and appropriate through out shift.  Pt. discharged to lobby.  Belongings sheet reviewed and signed by pt. and all belongings sent home. Paperwork reviewed and pt. able to verbalize understanding of education. Pt. in no current distress and ambulatory.

## 2016-09-20 NOTE — Progress Notes (Signed)
Recreation Therapy Notes  Date: 09/20/16 Time: 0930 Location: 300 [address] Dayroom  Group Topic: Stress Management  Goal Area(s) Addresses:  Patient will verbalize importance of using healthy stress management.  Patient will identify positive emotions associated with healthy stress management.   Behavioral Response: Engaged  Intervention: Calm App  Activity :  Body Scan Meditation.  LRT introduced the stress management technique of meditation to the group.  LRT played Nixon body scan meditation from the Calm App to allow patients to take notice of the stress and tension the body can hold.  Patients were to follow along with the app to participate in the activity to relieve their bodies of any tension or stress.  Education:  Stress Management, Discharge Planning.   Education Outcome: Acknowledges edcuation/In group clarification offered/Needs additional education  Clinical Observations/Feedback: Pt attended group.   Jaime Nixon, LRT/CTRS         Jaime Nixon 09/20/2016 11:21 AM 

## 2016-09-20 NOTE — [facility] Suicide Risk Assessment (Signed)
[facility]   Principal Problem: MDD (major depressive disorder), recurrent episode, severe (HCC) Discharge Diagnoses:  Patient Active Problem List   Diagnosis Date Noted  . Cannabis abuse [F12.10]   . MDD (major depressive disorder), recurrent episode, severe (HCC) [F33.2] 09/18/2016  . Panic attacks [F41.0] 09/15/2016  . Bradycardia [R00.1]   . OD (overdose of drug), intentional self-harm, initial encounter (HCC) [T50.902A] 09/10/2016  . Bradycardia, drug induced [R00.1, T50.905A] 09/10/2016  . Hypotension due to drugs [I95.2] 09/10/2016  . Overdose [T50.901A] 09/10/2016  . Generalized abdominal pain [R10.84]   . Hyperkalemia [E87.5]   . Uncontrollable vomiting [R11.10]   . Seizure disorder (HCC) [G40.909] 05/26/2016  . Cannabinoid hyperemesis syndrome (HCC) [N82.956][F12.988] 05/26/2016  . Hypokalemia due to loss of potassium [E87.6] 05/26/2016  . Tobacco dependence [F17.200] 05/26/2016  . Nausea and vomiting [R11.2] 05/26/2016  . Leukocytosis [D72.829] 12/27/2015  . Abnormal finding on MRI of brain [R90.89]   . 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 rate 16, height 6' (1.829 m), weight 78 kg (172 lb), SpO2 100 %.Body mass index is 23.33 kg/m.  General Appearance: Casual  Eye Contact::  Good  Speech:  Clear and Coherent and Normal Rate409  Volume:  Normal  Mood:  Euthymic  Affect:  Appropriate and Congruent  Thought Process:  Goal Directed  Orientation:  Full (Time, Place, and Person)  Thought Content:  WDL and Logical  Suicidal Thoughts:  No  Homicidal Thoughts:  No  Memory:  Immediate;   Good Recent;   Good Remote;   Good  Judgement:  Good  Insight:  Good  Psychomotor Activity:   Normal  Concentration:  Good  Recall:  Good  Fund of Knowledge:Good  Language: Good  Akathisia:  No  Handed:  Right  AIMS (if indicated):     Assets:  Communication Skills Desire for Improvement Financial Resources/Insurance Housing Physical Health Resilience Social Support Talents/Skills  Sleep:  Number of Hours: 6  Cognition: WNL  ADL's:  Intact   Mental Status Per Nursing Assessment::   On Admission:  NA  Demographic Factors:  Adolescent or young adult  Loss Factors: NA  Historical Factors: Impulsivity  Risk Reduction Factors:   Sense of responsibility to family, Religious beliefs about death, Employed, Living with another person, especially a relative, Positive social support, Positive therapeutic relationship and Positive coping skills or problem solving skills  Continued Clinical Symptoms:  Unstable or Poor Therapeutic Relationship  Cognitive Features That Contribute To Risk:  None    Suicide Risk:  Minimal: No identifiable suicidal ideation.  Patients presenting with no risk factors but with morbid ruminations; may be classified as minimal risk based on the severity of the depressive symptoms  Follow-up Information    [facility] Follow up.   Specialty:  Professional Counselor Why:  Walk in between 8am-12pm Monday through Friday for hospital follow-up/medication management/assessment for counseling services. Please resume pre-scheduled substance abuse classes upon discharge. Thank you.  Contact information: [address] [place] [address] [phone]           Plan Of Care/Follow-up recommendations:  Activity:  as tolerated Diet:  un change from past  See discharge summary for more details.  Matty Deamer T., MD 09/20/2016,  10:33 AM

## 2016-09-20 NOTE — [facility] Suicide Risk Assessment (Signed)
B[facility] INPATIENT:  Family/Significant Other Suicide Prevention Education  Suicide Prevention Education:  Patient Refusal for Family/Significant Other Suicide Prevention Education: The patient Jaime Nixon has refused to provide written consent for family/significant other to be provided Family/Significant Other Suicide Prevention Education during admission and/or prior to discharge.  Physician notified.  SPE completed with pt, as pt refused to consent to family contact. SPI pamphlet provided to pt and pt was encouraged to share information with support network, ask questions, and talk about any concerns relating to SPE. Pt denies access to guns/firearms and verbalized understanding of information provided. Mobile Crisis information also provided to pt.   Alexa Golebiewski N Smart LCSW 09/20/2016, 10:14 AM

## 2016-09-20 NOTE — Discharge Summary (Signed)
Physician Discharge Summary Note  Patient:  Jaime Nixon is an 20 y.o., male  MRN:  [id number]  DOB:  04-16-1996  Patient phone:  [phone] (home)   Patient address:   [address] [place] [id number],   Total Time spent with patient: 30 minutes  Date of Admission:  09/18/2016 Date of Discharge: 09-20-16  Reason for Admission: Suicide attempt by overdose  Principal Problem: MDD (major depressive disorder), recurrent episode, severe [facility](HCC)  Discharge Diagnoses: Patient Active Problem List   Diagnosis Date Noted  . Cannabis abuse [F12.10]   . MDD (major depressive disorder), recurrent episode, severe (HCC) [F33.2] 09/18/2016  . Panic attacks [F41.0] 09/15/2016  . Bradycardia [R00.1]   . OD (overdose of drug), intentional self-harm, initial encounter (HCC) [T50.902A] 09/10/2016  . Bradycardia, drug induced [R00.1, T50.905A] 09/10/2016  . Hypotension due to drugs [I95.2] 09/10/2016  . Overdose [T50.901A] 09/10/2016  . Generalized abdominal pain [R10.84]   . Hyperkalemia [E87.5]   . Uncontrollable vomiting [R11.10]   . Seizure disorder (HCC) [G40.909] 05/26/2016  . Cannabinoid hyperemesis syndrome (HCC) [O13.086][F12.988] 05/26/2016  . Hypokalemia due to loss of potassium [E87.6] 05/26/2016  . Tobacco dependence [F17.200] 05/26/2016  . Nausea and vomiting [R11.2] 05/26/2016  . Leukocytosis [D72.829] 12/27/2015  . Abnormal finding on MRI of brain [R90.89]   . 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 Current smoker   . History of substance use   . Seizures (HCC)    History reviewed. No pertinent surgical history. Family History:  Family History  Problem Relation Age of Onset  . Seizures Father   . Seizures Brother    Family Psychiatric  History: See H&P  Social History:  History  Alcohol Use  . Yes    Comment: rare use, but binges occasionally     History  Drug Use  . Types:  Marijuana    Comment: every other day    Social History   Social History  . Marital status: Single    Spouse name: N/A  . Number of children: N/A  . Years of education: N/A   Occupational History  . landscaper    Social History Main Topics  . Smoking status: Current Every Day Smoker    Packs/day: 1.00    Years: 10.00    Types: Cigarettes  . Smokeless tobacco: Never Used  . Alcohol use Yes     Comment: rare use, but binges occasionally  . Drug use:     Types: Marijuana     Comment: every other day  . Sexual activity: Not Asked   Other Topics Concern  . None   Social History Narrative  . None   Hospital Course: Jaime Nixon J Baileyis an 20 y.o.malepresenting due to intentional overdose of "Xanax, Benadryl, seizure medication"and additional unknown prescription.  Patient unsure of quantities and dosages consumed.  Upon admission, he stated the overdose was a suicide attempt.  Patient reports daily THC and Xanax ("footballs") use. Pt denies regular alcohol use.  His UDS + Benzos, +Cocaine Pt BAL <5.  Upon his arrival & admision to the adult unit, Jaime Nixon was evaluated & his presenting symptoms identified. The medication management for the presenting symptoms were discussed & initiated targeting those symptoms (see MAR for medication lists). He was enrolled in the group counseling sessions & encouraged to participate in the unit programming. His other pre-existing medical problems were identified & his home medications restarted  accordingly. During the course of his hospitalization, Jaime Nixon was evaluated on daily basis by the clinical providers to assure his response to the treatment regimen.As his treatment progressed, improvement was noted as evidenced by his report of decreasing symptoms, improved sleep, mood, affect, medication tolerance & active participation in the unit programming. He was encouraged to update his providers on his progress by daily completion of a self inventory  assessment, noting mood, mental status, any new symptoms, anxiety and or concerns.  Jaime Nixon's symptoms responded well to his treatment regimen combined with a therapeutic and supportive environment. He was motivated for recovery as evidenced by a positive/appropriate behavior and his interaction with the staff & fellow patients.He also worked closely with the treatment team and case manager to develop a discharge plan with appropriate goals to maintain mood stability after discharge.   Upon discharge, Jaime Nixon was in much improved condition than upon admission.His symptoms were reported as significantly decreased or resolved completely. He adamantly denies any SI/HI,  AVH, delusional thoughts & or paranoia. He was motivated to continue taking medication with a goal of continued improvement in mental health. He will continue psychiatric care on an outpatient basis as noted below. He is provided with all the necessary information required to make these appointments without problems. Jaime Nixon received a 7 days worth, supply samples of his [facility]. [facility] with all personal belongings in no apparent distress. Transportation per family friend.  Physical Findings: AIMS: Facial and Oral Movements Muscles of Facial Expression: None, normal Lips and Perioral Area: None, normal Jaw: None, normal Tongue: None, normal,Extremity Movements Upper (arms, wrists, hands, fingers): None, normal Lower (legs, knees, ankles, toes): None, normal, Trunk Movements Neck, shoulders, hips: None, normal, Overall Severity Severity of abnormal movements (highest score from questions above): None, normal Incapacitation due to abnormal movements: None, normal Patient's awareness of abnormal movements (rate only patient's report): No Awareness, Dental Status Current problems with teeth and/or dentures?: No Does patient usually wear dentures?: No  CIWA:  CIWA-Ar Total: 0 COWS:  COWS Total Score:  0  Musculoskeletal: Strength & Muscle Tone: within normal limits Gait & Station: normal Patient leans: N/A  Psychiatric Specialty Exam: Physical Exam  Constitutional: He is oriented to person, place, and time. He appears well-developed.  HENT:  Head: Normocephalic.  Eyes: Pupils are equal, round, and reactive to light.  Neck: Normal range of motion.  Cardiovascular: Normal rate.   Respiratory: Effort normal.  Genitourinary:  Genitourinary Comments: Denies any issues in this area  Musculoskeletal: Normal range of motion.  Neurological: He is alert and oriented to person, place, and time.  Skin: Skin is warm and dry.    Review of Systems  Constitutional: Negative.   HENT: Negative.   Eyes: Negative.   Respiratory: Negative.   Cardiovascular: Negative.   Gastrointestinal: Negative.   Genitourinary: Negative.   Musculoskeletal: Negative.   Skin: Negative.   Neurological: Negative.   Endo/Heme/Allergies: Negative.   Psychiatric/Behavioral: Positive for depression (Stable) and substance abuse (Hx. Benzodiazepine/Cannabis abuse). Negative for hallucinations, memory loss and suicidal ideas. The patient has insomnia (Stable). The patient is not nervous/anxious.     Blood pressure 112/81, pulse 91, temperature 97.6 F (36.4 C), temperature source Oral, resp. rate 16, height 6' (1.829 m), weight 78 kg (172 lb), SpO2 100 %.Body mass index is 23.33 kg/m.  See Md's SRA   Have you used any form of tobacco in the last 30 days? (Cigarettes, Smokeless Tobacco, Cigars, and/or Pipes): Yes  Has this  patient used any form of tobacco in the last 30 days? (Cigarettes, Smokeless Tobacco, Cigars, and/or Pipes):Yes, provided with nicorette gum prescription.  Blood Alcohol level:  Lab Results  Component Value Date   [facility] <5 09/10/2016   ETH <5 05/23/2016   Metabolic Disorder Labs:  Lab Results  Component Value Date   HGBA1C 5.3 01/01/2016   No results found for: PROLACTIN Lab Results   Component Value Date   CHOL 169 09/19/2016   TRIG 97 09/19/2016   HDL 32 (L) 09/19/2016   CHOLHDL 5.3 09/19/2016   VLDL 19 09/19/2016   LDLCALC 118 (H) 09/19/2016   See Psychiatric Specialty Exam and Suicide Risk Assessment completed by Attending Physician prior to discharge.  Discharge destination:  Home  Is patient on multiple antipsychotic therapies at discharge:  No   Has Patient had three or more failed trials of antipsychotic monotherapy by history:  No  Recommended Plan for Multiple Antipsychotic Therapies: NA     Medication List    TAKE these medications     Indication  FLUoxetine 20 MG capsule Commonly known as:  PROZAC Take 1 capsule (20 mg total) by mouth daily. For depression Start taking on:  09/21/2016 What changed:  additional instructions  Indication:  Major Depressive Disorder   hydrOXYzine 25 MG tablet Commonly known as:  ATARAX/VISTARIL Take 1 tablet (25 mg total) by mouth 3 (three) times daily as needed for anxiety.  Indication:  Anxiety Neurosis   levETIRAcetam 500 MG tablet Commonly known as:  KEPPRA Take 1 tablet (500 mg total) by mouth 2 (two) times daily. For seizure activities What changed:  additional instructions  Indication:  Seizure disorder   nicotine polacrilex 2 MG gum Commonly known as:  NICORETTE Take 1 each (2 mg total) by mouth as needed for smoking cessation.  Indication:  Nicotine Addiction   traZODone 50 MG tablet Commonly known as:  DESYREL Take 1 tablet (50 mg) at bedtime: For insomnia  Indication:  Trouble Sleeping      Follow-up Information    [facility] Follow up.   Specialty:  Professional Counselor Why:  Walk in between 8am-12pm Monday through Friday for hospital follow-up/medication management/assessment for counseling services. Please resume pre-scheduled substance abuse classes upon discharge. Thank you.  Contact information: [address] [place]  [phone]          Follow-up recommendations: Activity:  As tolerated Diet: As recommended by your primary care doctor. Keep all scheduled follow-up appointments as recommended.   Comments: Patient is instructed prior to discharge to: Take all medications as prescribed by his/her mental healthcare provider. Report any adverse effects and or reactions from the medicines to his/her outpatient provider promptly. Patient has been instructed & cautioned: To not engage in alcohol and or illegal drug use while on prescription medicines. In the event of worsening symptoms, patient is instructed to call the crisis hotline, 911 and or go to the nearest ED for appropriate evaluation and treatment of symptoms. To follow-up with his/her primary care provider for your other medical issues, concerns and or health care needs.   Signed: Sanjuana Kava, NP, PMHNP, FNP-BC 09/20/2016, 10:35 AM

## 2016-09-20 NOTE — Tx Team (Signed)
Interdisciplinary Treatment and Diagnostic Plan Update  09/20/2016 Time of Session:9:30AM Jaime Nixon MRN: [id number]  Principal Diagnosis: MDD (major depressive disorder), recurrent episode, severe ([place])  Secondary Diagnoses: Principal Problem:   MDD (major depressive disorder), recurrent episode, severe ([place]) Active Problems:   Cannabis abuse   Current Medications:  Current Facility-Administered Medications  Medication Dose Route Frequency Provider Last Rate Last Dose  . acetaminophen (TYLENOL) tablet 650 mg  650 mg Oral Q6H PRN Nanci Pina, FNP      . alum & mag hydroxide-simeth (MAALOX/MYLANTA) 200-200-20 MG/5ML suspension 30 mL  30 mL Oral Q4H PRN Nanci Pina, FNP      . FLUoxetine (PROZAC) capsule 20 mg  20 mg Oral Daily Nanci Pina, FNP   20 mg at 09/20/16 0755  . hydrOXYzine (ATARAX/VISTARIL) tablet 25 mg  25 mg Oral TID PRN Nanci Pina, FNP   25 mg at 09/19/16 1805  . levETIRAcetam (KEPPRA) tablet 500 mg  500 mg Oral BID Nanci Pina, FNP   500 mg at 09/20/16 0755  . magnesium hydroxide (MILK OF MAGNESIA) suspension 30 mL  30 mL Oral Daily PRN Nanci Pina, FNP      . nicotine polacrilex (NICORETTE) gum 2 mg  2 mg Oral PRN Jenne Campus, MD   2 mg at 09/20/16 0755  . traZODone (DESYREL) tablet 50 mg  50 mg Oral QHS,MR X 1 Nanci Pina, FNP   50 mg at 09/19/16 2112   PTA Medications: Prescriptions Prior to Admission  Medication Sig Dispense Refill Last Dose  . FLUoxetine (PROZAC) 20 MG capsule Take 1 capsule (20 mg total) by mouth daily. 30 capsule 0   . levETIRAcetam (KEPPRA) 500 MG tablet Take 1 tablet (500 mg total) by mouth 2 (two) times daily. 60 tablet 0 09/09/2016 at Unknown time    Patient Stressors: Financial difficulties Legal issue  Patient Strengths: Curator fund of knowledge Physical Health  Treatment Modalities: Medication Management, Group therapy, Case management,  1 to 1 session with clinician,  Psychoeducation, Recreational therapy.   Physician Treatment Plan for Primary Diagnosis: MDD (major depressive disorder), recurrent episode, severe ([place]) Long Term Goal(s): Improvement in symptoms so as ready for discharge Improvement in symptoms so as ready for discharge   Short Term Goals: Ability to identify changes in lifestyle to reduce recurrence of condition will improve Ability to verbalize feelings will improve Ability to disclose and discuss suicidal ideas Ability to demonstrate self-control will improve Ability to identify and develop effective coping behaviors will improve Ability to maintain clinical measurements within normal limits will improve Compliance with prescribed medications will improve Ability to identify triggers associated with substance abuse/mental health issues will improve Ability to identify changes in lifestyle to reduce recurrence of condition will improve Ability to verbalize feelings will improve Ability to disclose and discuss suicidal ideas Ability to demonstrate self-control will improve Ability to identify and develop effective coping behaviors will improve Ability to maintain clinical measurements within normal limits will improve Compliance with prescribed medications will improve Ability to identify triggers associated with substance abuse/mental health issues will improve  Medication Management: Evaluate patient's response, side effects, and tolerance of medication regimen.  Therapeutic Interventions: 1 to 1 sessions, Unit Group sessions and Medication administration.  Evaluation of Outcomes: Met  Physician Treatment Plan for Secondary Diagnosis: Principal Problem:   MDD (major depressive disorder), recurrent episode, severe ([place]) Active Problems:   Cannabis abuse  Long Term Goal(s): Improvement in symptoms  so as ready for discharge Improvement in symptoms so as ready for discharge   Short Term Goals: Ability to identify changes in  lifestyle to reduce recurrence of condition will improve Ability to verbalize feelings will improve Ability to disclose and discuss suicidal ideas Ability to demonstrate self-control will improve Ability to identify and develop effective coping behaviors will improve Ability to maintain clinical measurements within normal limits will improve Compliance with prescribed medications will improve Ability to identify triggers associated with substance abuse/mental health issues will improve Ability to identify changes in lifestyle to reduce recurrence of condition will improve Ability to verbalize feelings will improve Ability to disclose and discuss suicidal ideas Ability to demonstrate self-control will improve Ability to identify and develop effective coping behaviors will improve Ability to maintain clinical measurements within normal limits will improve Compliance with prescribed medications will improve Ability to identify triggers associated with substance abuse/mental health issues will improve     Medication Management: Evaluate patient's response, side effects, and tolerance of medication regimen.  Therapeutic Interventions: 1 to 1 sessions, Unit Group sessions and Medication administration.  Evaluation of Outcomes:Met   RN Treatment Plan for Primary Diagnosis: MDD (major depressive disorder), recurrent episode, severe ([place]) Long Term Goal(s): Knowledge of disease and therapeutic regimen to maintain health will improve  Short Term Goals: Ability to remain free from injury will improve, Ability to disclose and discuss suicidal ideas and Ability to identify and develop effective coping behaviors will improve  Medication Management: RN will administer medications as ordered by provider, will assess and evaluate patient's response and provide education to patient for prescribed medication. RN will report any adverse and/or side effects to prescribing provider.  Therapeutic  Interventions: 1 on 1 counseling sessions, Psychoeducation, Medication administration, Evaluate responses to treatment, Monitor vital signs and CBGs as ordered, Perform/monitor CIWA, COWS, AIMS and Fall Risk screenings as ordered, Perform wound care treatments as ordered.  Evaluation of Outcomes: Met   LCSW Treatment Plan for Primary Diagnosis: MDD (major depressive disorder), recurrent episode, severe ([place]) Long Term Goal(s): Safe transition to appropriate next level of care at discharge, Engage patient in therapeutic group addressing interpersonal concerns.  Short Term Goals: Engage patient in aftercare planning with referrals and resources, Facilitate patient progression through stages of change regarding substance use diagnoses and concerns and Identify triggers associated with mental health/substance abuse issues  Therapeutic Interventions: Assess for all discharge needs, 1 to 1 time with Social worker, Explore available resources and support systems, Assess for adequacy in community support network, Educate family and significant other(s) on suicide prevention, Complete Psychosocial Assessment, Interpersonal group therapy.  Evaluation of Outcomes:Adequate for Discharge/Met.    Progress in Treatment: Attending groups: Yes. Participating in groups: Yes. Taking medication as prescribed: Yes. Toleration medication: Yes. Family/Significant other contact made: SPE completed with pt; he did not consent to family contact.  Patient understands diagnosis: Yes. Discussing patient identified problems/goals with staff: Yes. Medical problems stabilized or resolved: Yes. Denies suicidal/homicidal ideation: Yes Issues/concerns per patient self-inventory: No. Other: n/a  New problem(s) identified: No, Describe:  n/a  New Short Term/Long Term Goal(s): medication management; development of comprehensive mental wellness/sobriety plan.   Discharge Plan or Barriers: Pt plans to return home with his  grandmother and will resume prescheduled substance abuse and mental health groups at [facility]. He states that he does not take mental health medication and does not plan to do so. Pt also provided with AA/NA list for [place]  information.   Reason for Continuation of Hospitalization: none  Estimated Length of Stay: d/c today   Attendees: Patient: 09/20/2016 8:28 AM  Physician: Dr. Adele Schilder MD 09/20/2016 8:28 AM  Nursing: Lenore Manner, RN 09/20/2016 8:28 AM  RN Care Manager: Rhunette Croft 09/20/2016 8:28 AM  Social Worker: Maxie Better, LCSW 09/20/2016 8:28 AM  Recreational Therapist:  09/20/2016 8:28 AM  Other: Lindell Spar NP; Samuel Jester NP 09/20/2016 8:28 AM  Other:  09/20/2016 8:28 AM  Other: 09/20/2016 8:28 AM    Scribe for Treatment Team: [place], LCSW 09/20/2016 8:28 AM

## 2016-09-20 NOTE — B[facility] Counselor (Signed)
[facility] is [facility]. Psychosocial Assessment is not required.  Trula SladeHeather Smart, MSW, LCSW Clinical Social Worker 09/20/2016 10:15 AM

## 2016-11-12 MED FILL — levETIRAcetam 500 MG TABS: 500 | 30 days supply | Qty: 60 | Fill #5

## 2017-01-22 ENCOUNTER — Emergency Department (HOSPITAL_COMMUNITY)
Admission: EM | Admit: 2017-01-22 | Discharge: 2017-01-22 | Payer: Self-pay | Attending: Emergency Medicine | Admitting: Emergency Medicine

## 2017-01-22 DIAGNOSIS — G40909 Epilepsy, unspecified, not intractable, without status epilepticus: Secondary | ICD-10-CM | POA: Insufficient documentation

## 2017-01-22 DIAGNOSIS — Z79899 Other long term (current) drug therapy: Secondary | ICD-10-CM | POA: Insufficient documentation

## 2017-01-22 DIAGNOSIS — F17210 Nicotine dependence, cigarettes, uncomplicated: Secondary | ICD-10-CM | POA: Insufficient documentation

## 2017-01-22 DIAGNOSIS — Z760 Encounter for issue of repeat prescription: Secondary | ICD-10-CM | POA: Insufficient documentation

## 2017-01-22 MED ORDER — LEVETIRACETAM 500 MG PO TABS: 500.0000 mg | ORAL_TABLET | Freq: Two times a day (BID) | ORAL | 2 refills | Status: DC

## 2017-01-22 MED ORDER — LEVETIRACETAM 500 MG PO TABS
1000.0000 mg | ORAL_TABLET | Freq: Once | ORAL | Status: AC
  Administered 2017-01-22: 1000 mg via ORAL
  Filled 2017-01-22: qty 2

## 2017-01-22 NOTE — ED Notes (Signed)
Pt a/o and in no distress or pain upon discharge. RN went over AVS and patient had no further questions.

## 2017-01-22 NOTE — Discharge Instructions (Signed)
Take your medication as directed.

## 2017-01-22 NOTE — ED Triage Notes (Signed)
Pt presents for clearance to go to jail. Arresting officer advised that he needed to be cleared before going in because of prior hx of seizures and has not been taking his medications. Pt reports headache and panic attack that occurred tonight.

## 2017-01-22 NOTE — ED Provider Notes (Signed)
[facility]-EMERGENCY DEPT Provider Note    By signing my name below, I, Jaime Nixon, attest that this documentation has been prepared under the direction and in the presence of [name], PA-C. Electronically Signed: Earmon PhoenixJennifer Nixon, ED Scribe. 01/22/17. 1:56 AM.   History   Chief Complaint Chief Complaint  Patient presents with  . Medical Clearance    The history is provided by the patient and medical records. No language interpreter was used.    Jaime Nixon is a 21 y.o. male with PMHx of seizures brought in by the bail bondsman who presents to the Emergency Department needing clearance so he can be incarcerated. He is unsure of the seizure medications he takes and is uncertain of which pharmacy he uses though he notes it has "something to do with [facility]". There are no modifying factors noted. He denies fever, chills, nausea, vomiting.   Past Medical History:  Diagnosis Date  . Current smoker   . History of substance use   . Seizures [facility](HCC)     Patient Active Problem List   Diagnosis Date Noted  . Cannabis abuse   . 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 Panic attacks 09/15/2016  . Bradycardia   . OD (overdose of drug), intentional self-harm, initial encounter (HCC) 09/10/2016  . Bradycardia, drug induced 09/10/2016  . Hypotension due to drugs 09/10/2016  . Overdose 09/10/2016  . Generalized abdominal pain   . Hyperkalemia   . Uncontrollable vomiting   . Seizure disorder (HCC) 05/26/2016  . Cannabinoid hyperemesis syndrome (HCC) 05/26/2016  . Hypokalemia due to loss of potassium 05/26/2016  . Tobacco dependence 05/26/2016  . Nausea and vomiting 05/26/2016  . Leukocytosis 12/27/2015  . Abnormal finding on MRI of brain   . Pachymeningitis     No past surgical history on file.     Home Medications    Prior to Admission medications   Medication Sig Start Date End Date Taking? Authorizing Provider   FLUoxetine (PROZAC) 20 MG capsule Take 1 capsule (20 mg total) by mouth daily. For depression 09/21/16  Yes Sanjuana KavaAgnes I Nwoko, NP  hydrOXYzine (ATARAX/VISTARIL) 25 MG tablet Take 1 tablet (25 mg total) by mouth 3 (three) times daily as needed for anxiety. 09/20/16  Yes Sanjuana KavaAgnes I Nwoko, NP  traZODone (DESYREL) 50 MG tablet Take 1 tablet (50 mg) at bedtime: For insomnia 09/20/16  Yes Sanjuana KavaAgnes I Nwoko, NP  levETIRAcetam (KEPPRA) 500 MG tablet Take 1 tablet (500 mg total) by mouth 2 (two) times daily. For seizure activities 01/22/17   Arthor CaptainAbigail Tariana Moldovan, PA-C  nicotine polacrilex (NICORETTE) 2 MG gum Take 1 each (2 mg total) by mouth as needed for smoking cessation. Patient not taking: Reported on 01/22/2017 09/20/16   Sanjuana KavaAgnes I Nwoko, NP    Family History Family History  Problem Relation Age of Onset  . Seizures Father   . Seizures Brother     Social History Social History  Substance Use Topics  . Smoking status: Current Every Day Smoker    Packs/day: 1.00    Years: 10.00    Types: Cigarettes  . Smokeless tobacco: Never Used  . Alcohol use Yes     Comment: rare use, but binges occasionally     Allergies   Dexamethasone and Prednisone   Review of Systems Review of Systems A complete 10 system review of systems was obtained and all systems are negative except as noted in the HPI and PMH.    Physical  Exam Updated Vital Signs BP 120/78 (BP Location: Right Arm)   Pulse 76   Temp 97.8 F (36.6 C) (Oral)   Resp 18   Ht 6' (1.829 m)   Wt 180 lb (81.6 kg)   SpO2 98%   BMI 24.41 kg/m   Physical Exam  Constitutional: He is oriented to person, place, and time. He appears well-developed and well-nourished. No distress.  HENT:  Head: Normocephalic and atraumatic.  Eyes: Conjunctivae and EOM are normal. Pupils are equal, round, and reactive to light. No scleral icterus.  Neck: Normal range of motion. Neck supple.  Cardiovascular: Normal rate, regular rhythm and normal heart sounds.     Pulmonary/Chest: Effort normal and breath sounds normal. No respiratory distress.  Abdominal: Soft. There is no tenderness.  Musculoskeletal: Normal range of motion. He exhibits no edema.  Neurological: He is alert and oriented to person, place, and time.  Skin: Skin is warm and dry. He is not diaphoretic.  Psychiatric: His behavior is normal.  Nursing note and vitals reviewed.    ED Treatments / Results  DIAGNOSTIC STUDIES: Oxygen Saturation is 98% on RA, normal by my interpretation.   COORDINATION OF CARE: 1:31 AM- Will give dose of seizure medications and provide prescription. Pt verbalizes understanding and agrees to plan.  Medications  levETIRAcetam (KEPPRA) tablet 1,000 mg (1,000 mg Oral Given 01/22/17 0157)    Labs (all labs ordered are listed, but only abnormal results are displayed) Labs Reviewed - No data to display  EKG  EKG Interpretation None       Radiology No results found.  Procedures Procedures (including critical care time)  Medications Ordered in ED Medications  levETIRAcetam (KEPPRA) tablet 1,000 mg (1,000 mg Oral Given 01/22/17 0157)     Initial Impression / Assessment and Plan / ED Course  I have reviewed the triage vital signs and the nursing notes.  Pertinent labs & imaging results that were available during my care of the patient were reviewed by me and considered in my medical decision making (see chart for details).    Patient given keppra loading dose and rx. Medically clear for jail  I personally performed the services described in this documentation, which was scribed in my presence. The recorded information has been reviewed and is accurate.     Final Clinical Impressions(s) / ED Diagnoses   Final diagnoses:  Medication refill    New Prescriptions Discharge Medication List as of 01/22/2017  2:02 AM       Arthor Captain, PA-C 01/22/17 1610    Geoffery Lyons, MD 01/22/17 [phone]

## 2017-02-24 ENCOUNTER — Encounter (HOSPITAL_COMMUNITY): Payer: Self-pay | Admitting: Emergency Medicine

## 2017-02-24 ENCOUNTER — Emergency Department (HOSPITAL_COMMUNITY)
Admission: EM | Admit: 2017-02-24 | Discharge: 2017-02-24 | Disposition: A | Discharge status: Home / Self Care | Payer: Self-pay | Attending: Emergency Medicine | Admitting: Emergency Medicine

## 2017-02-24 DIAGNOSIS — F17210 Nicotine dependence, cigarettes, uncomplicated: Secondary | ICD-10-CM | POA: Insufficient documentation

## 2017-02-24 DIAGNOSIS — R5383 Other fatigue: Secondary | ICD-10-CM | POA: Insufficient documentation

## 2017-02-24 LAB — COMPREHENSIVE METABOLIC PANEL
ALT: 14 U/L — ABNORMAL LOW (ref 17–63)
AST: 31 U/L (ref 15–41)
Albumin: 4.6 g/dL (ref 3.5–5.0)
Alkaline Phosphatase: 71 U/L (ref 38–126)
Anion gap: 8 (ref 5–15)
BUN: 16 mg/dL (ref 6–20)
CO2: 24 mmol/L (ref 22–32)
Calcium: 9.2 mg/dL (ref 8.9–10.3)
Chloride: 106 mmol/L (ref 101–111)
Creatinine, Ser: 0.87 mg/dL (ref 0.61–1.24)
GFR calc Af Amer: >60 mL/min (ref >60)
GFR calc non Af Amer: >60 mL/min (ref >60)
Glucose, Bld: 105 mg/dL — ABNORMAL HIGH (ref 65–99)
Potassium: 3.7 mmol/L (ref 3.5–5.1)
Sodium: 138 mmol/L (ref 135–145)
Total Bilirubin: 0.7 mg/dL (ref 0.3–1.2)
Total Protein: 7.3 g/dL (ref 6.5–8.1)

## 2017-02-24 LAB — CBC
HCT: 41.7 % (ref 39.0–52.0)
Hemoglobin: 14.8 g/dL (ref 13.0–17.0)
MCH: 31.2 pg (ref 26.0–34.0)
MCHC: 35.5 g/dL (ref 30.0–36.0)
MCV: 88 fL (ref 78.0–100.0)
Platelets: 187 10*3/uL (ref 150–400)
RBC: 4.74 MIL/uL (ref 4.22–5.81)
RDW: 13 % (ref 11.5–15.5)
WBC: 11.4 10*3/uL — ABNORMAL HIGH (ref 4.0–10.5)

## 2017-02-24 NOTE — ED Triage Notes (Signed)
Per EMS pt complaint of "withdrawal from xanax bars;" pt verbalizes has 4-5 a day; has not had any since Saturday. Pt alert and oriented x4; verbalizes n/v/d, eyes hurt, and the overwhelming urge to sleep."

## 2017-02-24 NOTE — ED Provider Notes (Signed)
[facility]-EMERGENCY DEPT Provider Note   CSN: [id number] Arrival date & time: 02/24/17  1743  By signing my name below, I, Linna Darner, attest that this documentation has been prepared under the direction and in the presence of physician practitioner, Raeford Razor, MD. Electronically Signed: Linna Darner, Scribe. 02/24/2017. 6:37 PM.  History   Chief Complaint Chief Complaint  Patient presents with  . Withdrawal   The history is provided by the patient. No language interpreter was used.    HPI Comments: Jaime Nixon is a 21 y.o. male with PMHx of substance abuse who presents to the Emergency Department via EMS for evaluation of malaise. He states that when he woke up this morning he felt unusually fatigued. He reports he developed a headache, body aches, and chills throughout the day at work and notes that these symptoms gradually worsened. Patient reports his coworkers called EMS on his behalf. He states that he has been asleep since he arrived to the ED and reports that he feels completely normal currently. He believes he just needed to get some rest in order to feel better. No recent alcohol consumption. Patient has no additional complaints at this time.  Past Medical History:  Diagnosis Date  . Current smoker   . History of substance use   . Seizures [facility])     Patient Active Problem List   Diagnosis Date Noted  . Cannabis abuse   . 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 Panic attacks 09/15/2016  . Bradycardia   . OD (overdose of drug), intentional self-harm, initial encounter (HCC) 09/10/2016  . Bradycardia, drug induced 09/10/2016  . Hypotension due to drugs 09/10/2016  . Overdose 09/10/2016  . Generalized abdominal pain   . Hyperkalemia   . Uncontrollable vomiting   . Seizure disorder (HCC) 05/26/2016  . Cannabinoid hyperemesis syndrome (HCC) 05/26/2016  . Hypokalemia due to loss of potassium 05/26/2016  .  Tobacco dependence 05/26/2016  . Nausea and vomiting 05/26/2016  . Leukocytosis 12/27/2015  . Abnormal finding on MRI of brain   . Pachymeningitis     History reviewed. No pertinent surgical history.     Home Medications    Prior to Admission medications   Medication Sig Start Date End Date Taking? Authorizing Provider  FLUoxetine (PROZAC) 20 MG capsule Take 1 capsule (20 mg total) by mouth daily. For depression 09/21/16   Sanjuana Kava, NP  hydrOXYzine (ATARAX/VISTARIL) 25 MG tablet Take 1 tablet (25 mg total) by mouth 3 (three) times daily as needed for anxiety. 09/20/16   Sanjuana Kava, NP  levETIRAcetam (KEPPRA) 500 MG tablet Take 1 tablet (500 mg total) by mouth 2 (two) times daily. For seizure activities 01/22/17   Arthor Captain, PA-C  nicotine polacrilex (NICORETTE) 2 MG gum Take 1 each (2 mg total) by mouth as needed for smoking cessation. Patient not taking: Reported on 01/22/2017 09/20/16   Sanjuana Kava, NP  traZODone (DESYREL) 50 MG tablet Take 1 tablet (50 mg) at bedtime: For insomnia 09/20/16   Sanjuana Kava, NP    Family History Family History  Problem Relation Age of Onset  . Seizures Father   . Seizures Brother     Social History Social History  Substance Use Topics  . Smoking status: Current Every Day Smoker    Packs/day: 1.00    Years: 10.00    Types: Cigarettes  . Smokeless tobacco: Never Used  . Alcohol use Yes  Comment: rare use, but binges occasionally     Allergies   Dexamethasone and Prednisone   Review of Systems Review of Systems  Constitutional: Positive for chills (resolved) and fatigue (resolved).  Musculoskeletal: Positive for myalgias (resolved).  Neurological: Positive for headaches (resolved).  All other systems reviewed and are negative.  Physical Exam Updated Vital Signs BP (!) 148/22   Pulse 82   Temp 98.2 F (36.8 C) (Oral)   Resp 20   SpO2 98%   Physical Exam  Constitutional: He appears well-developed and  well-nourished.  HENT:  Head: Normocephalic.  Right Ear: External ear normal.  Left Ear: External ear normal.  Nose: Nose normal.  Eyes: Conjunctivae are normal. Right eye exhibits no discharge. Left eye exhibits no discharge.  Neck: Normal range of motion.  Cardiovascular: Normal rate, regular rhythm and normal heart sounds.   No murmur heard. Pulmonary/Chest: Effort normal and breath sounds normal. No respiratory distress. He has no wheezes. He has no rales.  Abdominal: Soft. There is no tenderness. There is no rebound and no guarding.  Musculoskeletal: Normal range of motion. He exhibits no edema or tenderness.  Neurological: He is alert. No cranial nerve deficit. Coordination normal.  Skin: Skin is warm and dry. No rash noted. No erythema. No pallor.  Psychiatric: He has a normal mood and affect. His behavior is normal.  Nursing note and vitals reviewed.  ED Treatments / Results  Labs (all labs ordered are listed, but only abnormal results are displayed) Labs Reviewed  CBC - Abnormal; Notable for the following:       Result Value   WBC 11.4 (*)    All other components within normal limits  COMPREHENSIVE METABOLIC PANEL  ETHANOL  RAPID URINE DRUG SCREEN, HOSP PERFORMED    EKG  EKG Interpretation None       Radiology No results found.  Procedures Procedures (including critical care time)  DIAGNOSTIC STUDIES: Oxygen Saturation is 98% on RA, normal by my interpretation.    COORDINATION OF CARE: 6:41 PM Discussed treatment plan with pt at bedside and pt agreed to plan.  Medications Ordered in ED Medications - No data to display   Initial Impression / Assessment and Plan / ED Course  I have reviewed the triage vital signs and the nursing notes.  Pertinent labs & imaging results that were available during my care of the patient were reviewed by me and considered in my medical decision making (see chart for details).     20 year old male with fatigue. Symptoms  have since resolved. He reports he is back to his baseline. He is afebrile. Hemodynamically stable. His exam is reassuring.  It has been determined that no acute conditions requiring further emergency intervention are present at this time. The patient has been advised of the diagnosis and plan. I reviewed any labs and imaging including any potential incidental findings. We have discussed signs and symptoms that warrant return to the ED and they are listed in the discharge instructions.   Final Clinical Impressions(s) / ED Diagnoses   Final diagnoses:  Other fatigue    New Prescriptions New Prescriptions   No medications on file   I personally preformed the services scribed in my presence. The recorded information has been reviewed is accurate. Raeford Razor, MD.    Raeford Razor, MD 03/02/17 1017

## 2017-02-24 NOTE — ED Notes (Signed)
Bed: XL24 Expected date:  Expected time:  Means of arrival:  Comments: EMS Seizure

## 2017-05-15 ENCOUNTER — Ambulatory Visit: Payer: Self-pay | Admitting: Internal Medicine

## 2017-06-06 ENCOUNTER — Emergency Department
Admission: EM | Admit: 2017-06-06 | Discharge: 2017-06-06 | Disposition: A | Discharge status: Home / Self Care | Payer: Self-pay | Attending: Emergency Medicine | Admitting: Emergency Medicine

## 2017-06-06 DIAGNOSIS — Z79899 Other long term (current) drug therapy: Secondary | ICD-10-CM | POA: Insufficient documentation

## 2017-06-06 DIAGNOSIS — F17210 Nicotine dependence, cigarettes, uncomplicated: Secondary | ICD-10-CM | POA: Insufficient documentation

## 2017-06-06 DIAGNOSIS — T675XXA Heat exhaustion, unspecified, initial encounter: Secondary | ICD-10-CM | POA: Insufficient documentation

## 2017-06-06 LAB — BASIC METABOLIC PANEL
ANION GAP: 12 (ref 5–15)
BUN: 17 mg/dL (ref 6–20)
CO2: 21 mmol/L — ABNORMAL LOW (ref 22–32)
Calcium: 9.8 mg/dL (ref 8.9–10.3)
Chloride: 107 mmol/L (ref 101–111)
Creatinine, Ser: 1.08 mg/dL (ref 0.61–1.24)
GFR calc Af Amer: >60 mL/min (ref >60)
GLUCOSE: 91 mg/dL (ref 65–99)
POTASSIUM: 3.3 mmol/L — AB (ref 3.5–5.1)
Sodium: 140 mmol/L (ref 135–145)

## 2017-06-06 LAB — CBC
HEMATOCRIT: 45.5 % (ref 40.0–52.0)
Hemoglobin: 15.6 g/dL (ref 13.0–18.0)
MCH: 30.8 pg (ref 26.0–34.0)
MCHC: 34.4 g/dL (ref 32.0–36.0)
MCV: 89.7 fL (ref 80.0–100.0)
Platelets: 237 10*3/uL (ref 150–440)
RBC: 5.07 MIL/uL (ref 4.40–5.90)
RDW: 14.2 % (ref 11.5–14.5)
WBC: 8.2 10*3/uL (ref 3.8–10.6)

## 2017-06-06 MED ORDER — LEVETIRACETAM 500 MG PO TABS
500.0000 mg | ORAL_TABLET | Freq: Two times a day (BID) | ORAL | 2 refills | Status: DC
Start: 2017-06-06 — End: 2018-12-13

## 2017-06-06 MED ORDER — LEVETIRACETAM 500 MG PO TABS
500.0000 mg | ORAL_TABLET | Freq: Once | ORAL | Status: AC
  Administered 2017-06-06: 500 mg via ORAL
  Filled 2017-06-06: qty 1

## 2017-06-06 MED ORDER — SODIUM CHLORIDE 0.9 % IV SOLN
1000.0000 mL | Freq: Once | INTRAVENOUS | Status: AC
  Administered 2017-06-06: 1000 mL via INTRAVENOUS

## 2017-06-06 NOTE — ED Notes (Signed)
Pt calling for ride home 

## 2017-06-06 NOTE — ED Notes (Signed)
ED Provider at bedside. 

## 2017-06-06 NOTE — ED Triage Notes (Signed)
Pt arrivest o ER via [facility] from work site after seizure like activity. Pt was standing when activity began, states that boss caught him from falling. Pt alert and oriented X 4upon EMS arrival No urinary incontinence noted. VSS with EMS, CBG 103 with EMS. 20G to right hand started by EMS, 250cc NS infused. Pt alert and oriented X4, active, cooperative, pt in NAD. RR even and unlabored, color WNL.  Pt hx of seizures, stopped taking Keppra X 1 mont ago due to not b eing able to have it refilled. Generalized neck pain, ambulatory on scene.

## 2017-06-06 NOTE — ED Notes (Signed)
Pt alert and oriented X4, active, cooperative, pt in NAD. RR even and unlabored, color WNL.  Pt informed to return if any life threatening symptoms occur.   

## 2017-06-06 NOTE — ED Provider Notes (Signed)
[facility] Emergency Department Provider Note   ____________________________________________    I have reviewed the triage vital signs and the nursing notes.   HISTORY  Chief Complaint     HPI Jaime Nixon is a 21 y.o. male who presents after a likely syncopal episode. 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 He notes it is very hot and humid today. He states he must have fainted because he woke up in an ambulance. Patient does have a history of seizures but no seizure-like activity reported by coworkers. However patient does note that he hasn't had his medication in 1 month because he has been able to see his PCP. He denies loss of bowel or bladder, did not bite his tongue. Feels well now and states he is ready to go. EMS gave fluids.   Past Medical History:  Diagnosis Date  . Current smoker   . History of substance use   . Seizures [facility](HCC)     Patient Active Problem List   Diagnosis Date Noted  . Cannabis abuse   . 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 Panic attacks 09/15/2016  . Bradycardia   . OD (overdose of drug), intentional self-harm, initial encounter (HCC) 09/10/2016  . Bradycardia, drug induced 09/10/2016  . Hypotension due to drugs 09/10/2016  . Overdose 09/10/2016  . Generalized abdominal pain   . Hyperkalemia   . Uncontrollable vomiting   . Seizure disorder (HCC) 05/26/2016  . Cannabinoid hyperemesis syndrome (HCC) 05/26/2016  . Hypokalemia due to loss of potassium 05/26/2016  . Tobacco dependence 05/26/2016  . Nausea and vomiting 05/26/2016  . Leukocytosis 12/27/2015  . Abnormal finding on MRI of brain   . Pachymeningitis     History reviewed. No pertinent surgical history.  Prior to Admission medications   Medication Sig Start  Date End Date Taking? Authorizing Provider  FLUoxetine (PROZAC) 20 MG capsule Take 1 capsule (20 mg total) by mouth daily. For depression Patient not taking: Reported on 02/24/2017 09/21/16   Armandina StammerNwoko, Agnes I, NP  hydrOXYzine (ATARAX/VISTARIL) 25 MG tablet Take 1 tablet (25 mg total) by mouth 3 (three) times daily as needed for anxiety. 09/20/16   Armandina StammerNwoko, Agnes I, NP  levETIRAcetam (KEPPRA) 500 MG tablet Take 1 tablet (500 mg total) by mouth 2 (two) times daily. For seizure activities 06/06/17   Jene EveryKinner, Kebra Lowrimore, MD  nicotine polacrilex (NICORETTE) 2 MG gum Take 1 each (2 mg total) by mouth as needed for smoking cessation. Patient not taking: Reported on 01/22/2017 09/20/16   Armandina StammerNwoko, Agnes I, NP  traZODone (DESYREL) 50 MG tablet Take 1 tablet (50 mg) at bedtime: For insomnia 09/20/16   Armandina StammerNwoko, Agnes I, NP     Allergies Dexamethasone and Prednisone  Family History  Problem Relation Age of Onset  . Seizures Father   . Seizures Brother     Social History Social History  Substance Use Topics  . Smoking status: Current Every Day Smoker    Packs/day: 1.00    Years: 10.00    Types: Cigarettes  . Smokeless tobacco: Never Used  . Alcohol use Yes     Comment: rare use, but binges occasionally    Review of Systems  Constitutional: No fever/chills Eyes: No visual changes.  ENT: No sore throat. Cardiovascular: Denies chest pain. Respiratory: Denies  shortness of breath. Gastrointestinal: No abdominal pain.  No nausea, no vomiting.   Genitourinary: Negative for dysuria. Musculoskeletal: Negative for back pain. Skin: Negative for rash. Neurological: Negative for headaches    ____________________________________________   PHYSICAL EXAM:  VITAL SIGNS: ED Triage Vitals  Enc Vitals Group     BP 06/06/17 1233 107/73     Pulse Rate 06/06/17 1236 88     Resp 06/06/17 1234 17     Temp 06/06/17 1236 (!) 97.5 F (36.4 C)     Temp Source 06/06/17 1236 Oral     SpO2 06/06/17 1236 98 %      Weight 06/06/17 1236 93 kg (205 lb 0.4 oz)     Height 06/06/17 1236 1.854 m (6\' 1" )     Head Circumference --      Peak Flow --      Pain Score 06/06/17 1236 5     Pain Loc --      Pain Edu? --      Excl. in GC? --     Constitutional: Alert and oriented. No acute distress. Pleasant and interactive Eyes: Conjunctivae are normal.  Head: Atraumatic. Nose: No congestion/rhinnorhea. Mouth/Throat: Mucous membranes are moist.    Cardiovascular: Normal rate, regular rhythm. Grossly normal heart sounds.  Good peripheral circulation. Respiratory: Normal respiratory effort.  No retractions. Lungs CTAB. Gastrointestinal: Soft and nontender. No distention.  No CVA tenderness. Genitourinary: deferred Musculoskeletal: No lower extremity tenderness nor edema.  Warm and well perfused Neurologic:  Normal speech and language. No gross focal neurologic deficits are appreciated.  Skin:  Skin is warm, dry and intact. No rash noted. Psychiatric: Mood and affect are normal. Speech and behavior are normal.  ____________________________________________   LABS (all labs ordered are listed, but only abnormal results are displayed)  Labs Reviewed  BASIC METABOLIC PANEL - Abnormal; Notable for the following:       Result Value   Potassium 3.3 (*)    CO2 21 (*)    All other components within normal limits  CBC  URINALYSIS, COMPLETE (UACMP) WITH MICROSCOPIC   ____________________________________________  EKG  ED ECG REPORT I, Jene Every, the attending physician, personally viewed and interpreted this ECG.  Date: 06/06/2017  Rhythm: normal sinus rhythm QRS Axis: normal Intervals: normal ST/T Wave abnormalities: normal   ____________________________________________  RADIOLOGY  None ____________________________________________   PROCEDURES  Procedure(s) performed: No    Critical Care performed:No ____________________________________________   INITIAL IMPRESSION / ASSESSMENT  AND PLAN / ED COURSE  Pertinent labs & imaging results that were available during my care of the patient were reviewed by me and considered in my medical decision making (see chart for details).  Patient well-appearing in no acute distress. Still does not appear to be consistent with seizure as no seizure-like activity and the patient's description of feeling overheated all morning is more consistent with heat exhaustion. Lab work is reassuring. Patient given Keppra in ED as well as prescription to get him back on his medications. Fluids given in the ED as well. Patient felt quite well and anxious to be discharged. Recommend follow-up with PCP    ____________________________________________   FINAL CLINICAL IMPRESSION(S) / ED DIAGNOSES  Final diagnoses:  Heat exhaustion, initial encounter      NEW MEDICATIONS STARTED DURING THIS VISIT:  Discharge Medication List as of 06/06/2017  2:22 PM       Note:  This document was prepared using Dragon voice recognition software and may include unintentional dictation errors.  Jene Every, MD 06/06/17 [phone]

## 2018-05-25 ENCOUNTER — Emergency Department (HOSPITAL_COMMUNITY)
Admission: EM | Admit: 2018-05-25 | Discharge: 2018-05-25 | Disposition: A | Discharge status: Home / Self Care | Payer: Self-pay | Attending: Emergency Medicine | Admitting: Emergency Medicine

## 2018-05-25 ENCOUNTER — Encounter (HOSPITAL_COMMUNITY): Payer: Self-pay | Admitting: Emergency Medicine

## 2018-05-25 ENCOUNTER — Encounter (HOSPITAL_COMMUNITY): Payer: Self-pay

## 2018-05-25 ENCOUNTER — Emergency Department (HOSPITAL_COMMUNITY): Payer: Self-pay

## 2018-05-25 DIAGNOSIS — W57XXXA Bitten or stung by nonvenomous insect and other nonvenomous arthropods, initial encounter: Secondary | ICD-10-CM | POA: Insufficient documentation

## 2018-05-25 DIAGNOSIS — R569 Unspecified convulsions: Secondary | ICD-10-CM | POA: Insufficient documentation

## 2018-05-25 DIAGNOSIS — F17210 Nicotine dependence, cigarettes, uncomplicated: Secondary | ICD-10-CM | POA: Insufficient documentation

## 2018-05-25 DIAGNOSIS — L03011 Cellulitis of right finger: Secondary | ICD-10-CM | POA: Insufficient documentation

## 2018-05-25 DIAGNOSIS — Z79899 Other long term (current) drug therapy: Secondary | ICD-10-CM | POA: Insufficient documentation

## 2018-05-25 DIAGNOSIS — K2900 Acute gastritis without bleeding: Secondary | ICD-10-CM | POA: Insufficient documentation

## 2018-05-25 LAB — BASIC METABOLIC PANEL
Anion gap: 11 (ref 5–15)
BUN: 14 mg/dL (ref 6–20)
CALCIUM: 9.4 mg/dL (ref 8.9–10.3)
CO2: 25 mmol/L (ref 22–32)
CREATININE: 0.8 mg/dL (ref 0.61–1.24)
Chloride: 104 mmol/L (ref 98–111)
GFR calc Af Amer: >60 mL/min (ref >60)
GFR calc non Af Amer: >60 mL/min (ref >60)
GLUCOSE: 102 mg/dL — AB (ref 70–99)
POTASSIUM: 4.3 mmol/L (ref 3.5–5.1)
SODIUM: 140 mmol/L (ref 135–145)

## 2018-05-25 LAB — COMPREHENSIVE METABOLIC PANEL
ALT: 13 U/L (ref 0–44)
ANION GAP: 12 (ref 5–15)
AST: 25 U/L (ref 15–41)
Albumin: 4.5 g/dL (ref 3.5–5.0)
Alkaline Phosphatase: 85 U/L (ref 38–126)
BUN: 13 mg/dL (ref 6–20)
CALCIUM: 10.3 mg/dL (ref 8.9–10.3)
CHLORIDE: 106 mmol/L (ref 98–111)
CO2: 23 mmol/L (ref 22–32)
Creatinine, Ser: 0.89 mg/dL (ref 0.61–1.24)
Glucose, Bld: 126 mg/dL — ABNORMAL HIGH (ref 70–99)
Potassium: 4 mmol/L (ref 3.5–5.1)
SODIUM: 141 mmol/L (ref 135–145)
Total Bilirubin: 0.6 mg/dL (ref 0.3–1.2)
Total Protein: 7.3 g/dL (ref 6.5–8.1)

## 2018-05-25 LAB — CBC
HCT: 47.9 % (ref 39.0–52.0)
HEMOGLOBIN: 15.8 g/dL (ref 13.0–17.0)
MCH: 30 pg (ref 26.0–34.0)
MCHC: 33 g/dL (ref 30.0–36.0)
MCV: 90.9 fL (ref 78.0–100.0)
PLATELETS: 333 10*3/uL (ref 150–400)
RBC: 5.27 MIL/uL (ref 4.22–5.81)
RDW: 13.1 % (ref 11.5–15.5)
WBC: 18.9 10*3/uL — AB (ref 4.0–10.5)

## 2018-05-25 LAB — CBC WITH DIFFERENTIAL/PLATELET
Abs Immature Granulocytes: 0.1 10*3/uL (ref 0.0–0.1)
BASOS ABS: 0.1 10*3/uL (ref 0.0–0.1)
BASOS PCT: 1 %
EOS ABS: 0.3 10*3/uL (ref 0.0–0.7)
Eosinophils Relative: 3 %
HCT: 43.1 % (ref 39.0–52.0)
HEMOGLOBIN: 14.4 g/dL (ref 13.0–17.0)
Immature Granulocytes: 1 %
LYMPHS PCT: 22 %
Lymphs Abs: 2.1 10*3/uL (ref 0.7–4.0)
MCH: 30.6 pg (ref 26.0–34.0)
MCHC: 33.4 g/dL (ref 30.0–36.0)
MCV: 91.5 fL (ref 78.0–100.0)
MONO ABS: 0.7 10*3/uL (ref 0.1–1.0)
Monocytes Relative: 7 %
Neutro Abs: 6.5 10*3/uL (ref 1.7–7.7)
Neutrophils Relative %: 66 %
Platelets: 236 10*3/uL (ref 150–400)
RBC: 4.71 MIL/uL (ref 4.22–5.81)
RDW: 12.9 % (ref 11.5–15.5)
WBC: 9.7 10*3/uL (ref 4.0–10.5)

## 2018-05-25 LAB — LIPASE, BLOOD: LIPASE: 34 U/L (ref 11–51)

## 2018-05-25 MED ORDER — ONDANSETRON HCL 4 MG PO TABS: 4.0000 mg | ORAL_TABLET | Freq: Four times a day (QID) | ORAL | 0 refills | Status: DC

## 2018-05-25 MED ORDER — GI COCKTAIL ~~LOC~~
30.0000 mL | Freq: Once | ORAL | Status: AC
  Administered 2018-05-25: 30 mL via ORAL
  Filled 2018-05-25: qty 30

## 2018-05-25 MED ORDER — ONDANSETRON 4 MG PO TBDP
4.0000 mg | ORAL_TABLET | Freq: Once | ORAL | Status: AC | PRN
  Administered 2018-05-25: 4 mg via ORAL
  Filled 2018-05-25: qty 1

## 2018-05-25 MED ORDER — LEVETIRACETAM 500 MG PO TABS: 500.0000 mg | ORAL_TABLET | Freq: Two times a day (BID) | ORAL | 1 refills | Status: DC

## 2018-05-25 MED ORDER — RANITIDINE HCL 150 MG PO CAPS: 150.0000 mg | ORAL_CAPSULE | Freq: Every day | ORAL | 0 refills | Status: DC

## 2018-05-25 MED ORDER — IOHEXOL 300 MG/ML  SOLN
100.0000 mL | Freq: Once | INTRAMUSCULAR | Status: AC | PRN
  Administered 2018-05-25: 100 mL via INTRAVENOUS

## 2018-05-25 MED ORDER — LEVETIRACETAM 500 MG PO TABS
500.0000 mg | ORAL_TABLET | Freq: Once | ORAL | Status: AC
  Administered 2018-05-25: 500 mg via ORAL
  Filled 2018-05-25: qty 1

## 2018-05-25 MED ORDER — DOXYCYCLINE HYCLATE 100 MG PO TABS
200.0000 mg | ORAL_TABLET | Freq: Once | ORAL | Status: AC
  Administered 2018-05-25: 200 mg via ORAL
  Filled 2018-05-25: qty 2

## 2018-05-25 NOTE — ED Notes (Signed)
Patient transported to X-ray 

## 2018-05-25 NOTE — ED Provider Notes (Addendum)
[facility] EMERGENCY [facility]PARTMENT Provider Note   CSN: [id number] Arrival date & time: 05/25/18  [id number]     History   Chief Complaint Chief Complaint  Patient presents with  . Hand Pain  . Insect Bite  . Seizures    HPI Jaime Nixon is a 22 y.o. male.  HPI   Jaime Nixon is a 22 y.o. male, with a history of seizures, presenting to the ED with complaint of seizures intermittently for the past year. Has been noncompliant with seizure medication due to cost for the last year. States fiance has told him he has been having seizures at night.  States he may have also had 2 seizures during the day this week.  Complains of intermittent right index finger swelling and pain for the past 2 months.  States he will start to drain, feel better, and then recur.  States that currently "feels fine."  Endorses tick bite near umbilicus about a month ago.  Denies routine alcohol use.  Denies illicit drug use. Denies rashes, fever, chest pain, shortness of breath, abdominal pain, N/V/D, urinary or bowel incontinence, abnormal headaches, neuro deficits, recent trauma, or any other complaints.   Past Medical History:  Diagnosis Date  . Current smoker   . History of substance use   . Seizures [facility](HCC)     Patient Active Problem List   Diagnosis Date Noted  . Cannabis abuse   . 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 Panic attacks 09/15/2016  . Bradycardia   . OD (overdose of drug), intentional self-harm, initial encounter (HCC) 09/10/2016  . Bradycardia, drug induced 09/10/2016  . Hypotension due to drugs 09/10/2016  . Overdose 09/10/2016  . Generalized abdominal pain   . Hyperkalemia   . Uncontrollable vomiting   . Seizure disorder (HCC) 05/26/2016  . Cannabinoid hyperemesis syndrome (HCC) 05/26/2016  . Hypokalemia due to loss of potassium 05/26/2016  . Tobacco dependence 05/26/2016  . Nausea and vomiting  05/26/2016  . Leukocytosis 12/27/2015  . Abnormal finding on MRI of brain   . Pachymeningitis     History reviewed. No pertinent surgical history.      Home Medications    Prior to Admission medications   Medication Sig Start Date End Date Taking? Authorizing Provider  diphenhydrAMINE-APAP, sleep, (GOODY PM PO) Take 1 Package by mouth at bedtime.   Yes [provider]  famotidine (PEPCID AC) 10 MG chewable tablet Chew 10 mg by mouth as needed for heartburn.   Yes [provider]  FLUoxetine (PROZAC) 20 MG capsule Take 1 capsule (20 mg total) by mouth daily. For depression Patient not taking: Reported on 02/24/2017 09/21/16   Armandina StammerNwoko, Agnes I, NP  hydrOXYzine (ATARAX/VISTARIL) 25 MG tablet Take 1 tablet (25 mg total) by mouth 3 (three) times daily as needed for anxiety. Patient not taking: Reported on 05/25/2018 09/20/16   Armandina StammerNwoko, Agnes I, NP  levETIRAcetam (KEPPRA) 500 MG tablet Take 1 tablet (500 mg total) by mouth 2 (two) times daily. For seizure activities Patient not taking: Reported on 05/25/2018 06/06/17   Jene EveryKinner, Robert, MD  levETIRAcetam (KEPPRA) 500 MG tablet Take 1 tablet (500 mg total) by mouth 2 (two) times daily. 05/25/18 07/24/18  Allisyn Kunz C, PA-C  nicotine polacrilex (NICORETTE) 2 MG gum Take 1 each (2 mg total) by mouth as needed for smoking cessation. Patient not taking: Reported on 01/22/2017 09/20/16   Armandina StammerNwoko, Agnes I, NP  traZODone (DESYREL) 50  MG tablet Take 1 tablet (50 mg) at bedtime: For insomnia Patient not taking: Reported on 05/25/2018 09/20/16   Sanjuana Kava, NP    Family History Family History  Problem Relation Age of Onset  . Seizures Father   . Seizures Brother     Social History Social History   Tobacco Use  . Smoking status: Current Every Day Smoker    Packs/day: 1.00    Years: 10.00    Pack years: 10.00    Types: Cigarettes  . Smokeless tobacco: Never Used  Substance Use Topics  . Alcohol use: Yes    Comment: rare use, but  binges occasionally  . Drug use: Yes    Types: Marijuana    Comment: every other day     Allergies   Dexamethasone and Prednisone   Review of Systems Review of Systems  Constitutional: Negative for chills, diaphoresis and fever.  Respiratory: Negative for shortness of breath.   Cardiovascular: Negative for chest pain.  Gastrointestinal: Negative for abdominal pain, diarrhea, nausea and vomiting.  Musculoskeletal: Negative for neck pain and neck stiffness.  Skin: Positive for wound. Negative for rash.  Neurological: Positive for seizures. Negative for dizziness, weakness, numbness and headaches.  All other systems reviewed and are negative.    Physical Exam Updated Vital Signs BP (!) 95/55   Pulse 74   Temp (!) 97.5 F (36.4 C) (Oral)   Resp 20   SpO2 100%   Physical Exam  Constitutional: He is oriented to person, place, and time. He appears well-developed and well-nourished. No distress.  HENT:  Head: Normocephalic and atraumatic.  Mouth/Throat: Oropharynx is clear and moist.  Eyes: Pupils are equal, round, and reactive to light. Conjunctivae and EOM are normal.  Neck: Neck supple.  Cardiovascular: Normal rate, regular rhythm, normal heart sounds and intact distal pulses.  Pulmonary/Chest: Effort normal and breath sounds normal. No respiratory distress.  Abdominal: Soft. There is no tenderness. There is no guarding.  Musculoskeletal: He exhibits no edema.  Lymphadenopathy:    He has no cervical adenopathy.  Neurological: He is alert and oriented to person, place, and time.  Sensation grossly intact to light touch in all four extremities. Strength 5/5 in all extremities. No gait disturbance. Coordination intact. Cranial nerves III-XII grossly intact. No facial droop.   Skin: Skin is warm and dry. Capillary refill takes less than 2 seconds. No rash noted. He is not diaphoretic.  Small area of questionable swelling to the distal right index finger without tenderness,  erythema, fluctuance, or increased warmth.  Small, crusted lesion in the umbilicus without erythema, tenderness, exudate, or swelling.  Psychiatric: He has a normal mood and affect. His behavior is normal.  Nursing note and vitals reviewed.    ED Treatments / Results  Labs (all labs ordered are listed, but only abnormal results are displayed) Labs Reviewed  BASIC METABOLIC PANEL - Abnormal; Notable for the following components:      Result Value   Glucose, Bld 102 (*)    All other components within normal limits  CBC WITH DIFFERENTIAL/PLATELET    EKG None  Radiology Dg Finger Index Right  Result Date: 05/25/2018 CLINICAL DATA:  Soft tissue swelling. EXAM: RIGHT INDEX FINGER 2+V COMPARISON:  None. FINDINGS: Osseous alignment is normal. Bone mineralization is normal. No acute or suspicious osseous lesion. No cortical irregularity, destructive change, demineralization or other signs of osteomyelitis. Soft tissues are unremarkable. No evidence of soft tissue gas. IMPRESSION: Negative. Electronically Signed   By: Weyman Croon  Linde Gillis M.D.   On: 05/25/2018 09:19    Procedures Procedures (including critical care time)  Medications Ordered in ED Medications  doxycycline (VIBRA-TABS) tablet 200 mg (200 mg Oral Given 05/25/18 0933)  levETIRAcetam (KEPPRA) tablet 500 mg (500 mg Oral Given 05/25/18 0933)     Initial Impression / Assessment and Plan / ED Course  I have reviewed the triage vital signs and the nursing notes.  Pertinent labs & imaging results that were available during my care of the patient were reviewed by me and considered in my medical decision making (see chart for details).      Patient presents with recurrence of seizures over the past year.  He has been noncompliant with his medications.  We will start the patient back on his medications and encourage follow-up with his primary care provider and neurology.  He has been placed on seizure precautions. Patient's repeat  swelling in the finger may be paronychia, but is already self draining with no evidence of cellulitis today.  No evidence of foreign body on x-ray, however, patient was counseled on the possibility of there being an occult foreign body.  For this reason, he will follow-up with PCP for possible referral to hand specialist, as needed. Wound in the umbilicus appears to be healing well.  Since patient is otherwise asymptomatic, we gave him a single dose of doxycycline. The patient was given instructions for home care as well as return precautions. Patient voices understanding of these instructions, accepts the plan, and is comfortable with discharge.  Final Clinical Impressions(s) / ED Diagnoses   Final diagnoses:  Seizures (HCC)  Paronychia of finger of right hand  Tick bite, initial encounter    ED Discharge Orders        Ordered    levETIRAcetam (KEPPRA) 500 MG tablet  2 times daily     05/25/18 1022       Anselm Pancoast, PA-C 05/25/18 1022    Anselm Pancoast, PA-C 05/25/18 1023    Shaune Pollack, MD 05/25/18 1655

## 2018-05-25 NOTE — ED Notes (Signed)
Pt verbalizes understanding of d/c instructions. Pt received prescriptions. Pt ambulatory at d/c with all belongings.  

## 2018-05-25 NOTE — ED Notes (Signed)
Attempted pt blood pressure multiple times. Pt is stating extreme abdominal pain. Pt does not want blood pressure taken due to pain at the moment. RN notified of pt pain. Pt asking when he will be seen by a dr.

## 2018-05-25 NOTE — ED Triage Notes (Signed)
Patient complains of abdominal pain and vomiting that started after receiving a PO antibiotic and being discharged home. Patient states he has not eaten anything today except for keppra and his antibiotic. Patient alert, oriented, and in no apparent distress at this time.

## 2018-05-25 NOTE — ED Notes (Signed)
Patient transported to CT 

## 2018-05-25 NOTE — ED Triage Notes (Signed)
Pt states that his R index finger has been swollen for a month, he drained it himself, and swelling continues. Also c/o of being bit by tick on belly button and thinks that is infected and that he has been off seizure medications for a year and he has been having them at nighttime.

## 2018-05-25 NOTE — Discharge Instructions (Signed)
Please read attached information. If you experience any new or worsening signs or symptoms please return to the emergency room for evaluation. Please follow-up with your primary care provider or specialist as discussed. Please use medication prescribed only as directed and discontinue taking if you have any concerning signs or symptoms.   °

## 2018-05-25 NOTE — Discharge Instructions (Addendum)
Begin taking the Keppra again, as prescribed.  Follow-up with the primary care provider or with neurology on this matter.  You may need repeat imaging, such as MRI, performed on an outpatient basis. Patient to continue to clean the wound in the bellybutton. For the finger swelling and pain, soak the finger for 15 to 20 minutes at a time daily in warm water or warm water with Epsom salts to prevent repeat occurrences.  [place], patients with seizures are not allowed to drive until  they have been seizure-free for six months. Use caution when using heavy equipment or power tools. Avoid working on ladders or at heights. Take showers instead of baths. Ensure the water temperature is not too high on the home water heater. Do not go swimming alone. When caring for infants or small children, sit down when holding, feeding, or changing them to minimize risk of injury to the child in the event you have a seizure.   Also, Maintain good sleep hygiene. Avoid alcohol.   --> Call 911 and bring the patient back to the ED if:                   A.  The seizure lasts longer than 5 minutes.                  B.  The patient doesn't awaken shortly after the seizure             C.  The patient has new problems such as difficulty seeing, speaking or moving             D.  The patient was injured during the seizure             E.  The patient has a temperature over 102 F (39C)             F.  The patient vomited and now is having trouble breathing

## 2018-05-25 NOTE — ED Notes (Signed)
Patient left prior to receiving papers

## 2018-05-25 NOTE — ED Provider Notes (Signed)
[facility] EMERGENCY DEPARTMENT Provider Note   CSN: [id number] Arrival date & time: 05/25/18  1216    History   Chief Complaint Chief Complaint  Patient presents with  . Abdominal Pain    HPI Jaime Nixon is a 22 y.o. male.  HPI   22 year old male presents to with complaints of epigastric abdominal pain. Patient notes that he was seen in the emergency room earlier today with complaints of infection to his finger. Chart review shows that he was also seen for seizures.  Patient notes he was given a dose of doxycycline for a tick bite approximate one month ago. Patient notes 30 minutes after taking the medication he had acute onset epigastric abdominal pain, and several episodes of nonbloody vomiting. Denies any lower abdominal pain, denies any diarrhea, fever.  Past Medical History:  Diagnosis Date  . Current smoker   . History of substance use   . Seizures [facility])     Patient Active Problem List   Diagnosis Date Noted  . Cannabis abuse   . 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 Panic attacks 09/15/2016  . Bradycardia   . OD (overdose of drug), intentional self-harm, initial encounter (HCC) 09/10/2016  . Bradycardia, drug induced 09/10/2016  . Hypotension due to drugs 09/10/2016  . Overdose 09/10/2016  . Generalized abdominal pain   . Hyperkalemia   . Uncontrollable vomiting   . Seizure disorder (HCC) 05/26/2016  . Cannabinoid hyperemesis syndrome (HCC) 05/26/2016  . Hypokalemia due to loss of potassium 05/26/2016  . Tobacco dependence 05/26/2016  . Nausea and vomiting 05/26/2016  . Leukocytosis 12/27/2015  . Abnormal finding on MRI of brain   . Pachymeningitis     History reviewed. No pertinent surgical history.      Home Medications    Prior to Admission medications   Medication Sig Start Date End Date Taking? Authorizing Provider  diphenhydrAMINE-APAP, sleep, (GOODY PM PO)  Take 1 Package by mouth at bedtime.    [provider]  famotidine (PEPCID AC) 10 MG chewable tablet Chew 10 mg by mouth as needed for heartburn.    [provider]  FLUoxetine (PROZAC) 20 MG capsule Take 1 capsule (20 mg total) by mouth daily. For depression Patient not taking: Reported on 02/24/2017 09/21/16   Armandina Stammer I, NP  hydrOXYzine (ATARAX/VISTARIL) 25 MG tablet Take 1 tablet (25 mg total) by mouth 3 (three) times daily as needed for anxiety. 09/20/16   Armandina Stammer I, NP  levETIRAcetam (KEPPRA) 500 MG tablet Take 1 tablet (500 mg total) by mouth 2 (two) times daily. For seizure activities 06/06/17   Jene Every, MD  levETIRAcetam (KEPPRA) 500 MG tablet Take 1 tablet (500 mg total) by mouth 2 (two) times daily. 05/25/18 07/24/18  Joy, Shawn C, PA-C  nicotine polacrilex (NICORETTE) 2 MG gum Take 1 each (2 mg total) by mouth as needed for smoking cessation. 09/20/16   Armandina Stammer I, NP  ondansetron (ZOFRAN) 4 MG tablet Take 1 tablet (4 mg total) by mouth every 6 (six) hours. 05/25/18   Romon Devereux, Tinnie Gens, PA-C  ranitidine (ZANTAC) 150 MG capsule Take 1 capsule (150 mg total) by mouth daily. 05/25/18   Kydan Shanholtzer, Tinnie Gens, PA-C  traZODone (DESYREL) 50 MG tablet Take 1 tablet (50 mg) at bedtime: For insomnia 09/20/16   Sanjuana Kava, NP    Family History Family History  Problem Relation Age of Onset  . Seizures Father   .  Seizures Brother     Social History Social History   Tobacco Use  . Smoking status: Current Every Day Smoker    Packs/day: 1.00    Years: 10.00    Pack years: 10.00    Types: Cigarettes  . Smokeless tobacco: Never Used  Substance Use Topics  . Alcohol use: Yes    Comment: rare use, but binges occasionally  . Drug use: Yes    Types: Marijuana    Comment: every other day     Allergies   Dexamethasone and Prednisone   Review of Systems Review of Systems  All other systems reviewed and are negative.   Physical Exam Updated Vital  Signs BP 124/77 (BP Location: Right Arm)   Pulse 76   Temp 98.3 F (36.8 C) (Oral)   Resp 20   SpO2 100%   Physical Exam  Constitutional: He is oriented to person, place, and time. He appears well-developed and well-nourished.  HENT:  Head: Normocephalic and atraumatic.  Eyes: Pupils are equal, round, and reactive to light. Conjunctivae are normal. Right eye exhibits no discharge. Left eye exhibits no discharge. No scleral icterus.  Neck: Normal range of motion. No JVD present. No tracheal deviation present.  Pulmonary/Chest: Effort normal. No stridor.  Abdominal:  Tenderness palpation of the epigastric region, abdomen soft without rebound or guarding- remainder of abdomen  soft nontender  Neurological: He is alert and oriented to person, place, and time. Coordination normal.  Skin: Skin is warm. No rash noted.  Psychiatric: He has a normal mood and affect. His behavior is normal. Judgment and thought content normal.  Nursing note and vitals reviewed.    ED Treatments / Results  Labs (all labs ordered are listed, but only abnormal results are displayed) Labs Reviewed  COMPREHENSIVE METABOLIC PANEL - Abnormal; Notable for the following components:      Result Value   Glucose, Bld 126 (*)    All other components within normal limits  CBC - Abnormal; Notable for the following components:   WBC 18.9 (*)    All other components within normal limits  LIPASE, BLOOD    EKG None  Radiology Ct Abdomen Pelvis W Contrast  Result Date: 05/25/2018 CLINICAL DATA:  Sudden onset abdominal pain with nausea and vomiting EXAM: CT ABDOMEN AND PELVIS WITH CONTRAST TECHNIQUE: Multidetector CT imaging of the abdomen and pelvis was performed using the standard protocol following bolus administration of intravenous contrast. CONTRAST:  100mL OMNIPAQUE IOHEXOL 300 MG/ML  SOLN COMPARISON:  Radiograph 05/25/2018, CT 05/25/2016 FINDINGS: Lower chest: No acute abnormality. Hepatobiliary: No focal liver  abnormality is seen. No gallstones, gallbladder wall thickening, or biliary dilatation. Pancreas: Unremarkable. No pancreatic ductal dilatation or surrounding inflammatory changes. Spleen: Normal in size without focal abnormality. Adrenals/Urinary Tract: Adrenal glands are unremarkable. Kidneys are normal, without renal calculi, focal lesion, or hydronephrosis. Bladder is unremarkable. Stomach/Bowel: Stomach is within normal limits. Appendix appears normal. No evidence of bowel wall thickening, distention, or inflammatory changes. Vascular/Lymphatic: No significant vascular findings are present. No enlarged abdominal or pelvic lymph nodes. Reproductive: Prostate is unremarkable. Other: Negative for free air or free fluid. Musculoskeletal: No acute or significant osseous findings. IMPRESSION: Negative. No CT evidence for acute intra-abdominal or pelvic abnormality. Electronically Signed   By: Jasmine PangKim  Fujinaga M.D.   On: 05/25/2018 18:39   Dg Abdomen Acute W/chest  Result Date: 05/25/2018 CLINICAL DATA:  Epigastric abdominal pain EXAM: DG ABDOMEN ACUTE W/ 1V CHEST COMPARISON:  05/27/2016 abdominal radiograph FINDINGS: There is no  evidence of dilated bowel loops or free intraperitoneal air. No radiopaque calculi or other significant radiographic abnormality is seen. Heart size and mediastinal contours are within normal limits. Both lungs are clear. IMPRESSION: Negative abdominal radiographs.  No acute cardiopulmonary disease. Electronically Signed   By: Deatra Robinson M.D.   On: 05/25/2018 16:34   Dg Finger Index Right  Result Date: 05/25/2018 CLINICAL DATA:  Soft tissue swelling. EXAM: RIGHT INDEX FINGER 2+V COMPARISON:  None. FINDINGS: Osseous alignment is normal. Bone mineralization is normal. No acute or suspicious osseous lesion. No cortical irregularity, destructive change, demineralization or other signs of osteomyelitis. Soft tissues are unremarkable. No evidence of soft tissue gas. IMPRESSION: Negative.  Electronically Signed   By: Bary Richard M.D.   On: 05/25/2018 09:19    Procedures Procedures (including critical care time)  Medications Ordered in ED Medications  ondansetron (ZOFRAN-ODT) disintegrating tablet 4 mg (4 mg Oral Given 05/25/18 1223)  ondansetron (ZOFRAN-ODT) disintegrating tablet 4 mg (4 mg Oral Given 05/25/18 1518)  gi cocktail (Maalox,Lidocaine,Donnatal) (30 mLs Oral Given 05/25/18 1546)  iohexol (OMNIPAQUE) 300 MG/ML solution 100 mL (100 mLs Intravenous Contrast Given 05/25/18 1740)     Initial Impression / Assessment and Plan / ED Course  I have reviewed the triage vital signs and the nursing notes.  Pertinent labs & imaging results that were available during my care of the patient were reviewed by me and considered in my medical decision making (see chart for details).     Labs: lipase, CMP, CBC  Imaging:CT abdomen pelvis with contrast, DG abdomen acute with chest  Consults:  Therapeutics:Zofran, GI cocktail  Discharge Meds:   Assessment/Plan: 22 year old male presents today with complaints of abdominal pain. Patient's symptoms are most consistent with gastritis. Symptoms dramatically improved with Zofran and GI cocktail.       Final Clinical Impressions(s) / ED Diagnoses   Final diagnoses:  Acute gastritis without hemorrhage, unspecified gastritis type    ED Discharge Orders        Ordered    ranitidine (ZANTAC) 150 MG capsule  Daily,   Status:  Discontinued     05/25/18 1850    ondansetron (ZOFRAN) 4 MG tablet  Every 6 hours,   Status:  Discontinued     05/25/18 1850    ondansetron (ZOFRAN) 4 MG tablet  Every 6 hours     05/25/18 1900    ranitidine (ZANTAC) 150 MG capsule  Daily     05/25/18 1900       Yousif Edelson, Josie Dixon 05/25/18 2147    Sabas Sous, MD 05/25/18 2326

## 2018-08-11 ENCOUNTER — Encounter (HOSPITAL_COMMUNITY): Payer: Self-pay | Admitting: Emergency Medicine

## 2018-08-11 ENCOUNTER — Emergency Department (HOSPITAL_COMMUNITY): Payer: Self-pay

## 2018-08-11 ENCOUNTER — Emergency Department (HOSPITAL_COMMUNITY)
Admission: EM | Admit: 2018-08-11 | Discharge: 2018-08-11 | Disposition: A | Discharge status: Home / Self Care | Payer: Self-pay | Attending: Emergency Medicine | Admitting: Emergency Medicine

## 2018-08-11 ENCOUNTER — Other Ambulatory Visit: Payer: Self-pay

## 2018-08-11 DIAGNOSIS — X500XXA Overexertion from strenuous movement or load, initial encounter: Secondary | ICD-10-CM | POA: Insufficient documentation

## 2018-08-11 DIAGNOSIS — Z79899 Other long term (current) drug therapy: Secondary | ICD-10-CM | POA: Insufficient documentation

## 2018-08-11 DIAGNOSIS — Y9289 Other specified places as the place of occurrence of the external cause: Secondary | ICD-10-CM | POA: Insufficient documentation

## 2018-08-11 DIAGNOSIS — S46811A Strain of other muscles, fascia and tendons at shoulder and upper arm level, right arm, initial encounter: Secondary | ICD-10-CM | POA: Insufficient documentation

## 2018-08-11 DIAGNOSIS — F17210 Nicotine dependence, cigarettes, uncomplicated: Secondary | ICD-10-CM | POA: Insufficient documentation

## 2018-08-11 DIAGNOSIS — Y990 Civilian activity done for income or pay: Secondary | ICD-10-CM | POA: Insufficient documentation

## 2018-08-11 DIAGNOSIS — Y9389 Activity, other specified: Secondary | ICD-10-CM | POA: Insufficient documentation

## 2018-08-11 MED ORDER — KETOROLAC TROMETHAMINE 60 MG/2ML IM SOLN
60.0000 mg | Freq: Once | INTRAMUSCULAR | Status: AC
  Administered 2018-08-11: 60 mg via INTRAMUSCULAR
  Filled 2018-08-11: qty 2

## 2018-08-11 MED ORDER — METHOCARBAMOL 500 MG PO TABS: 1000.0000 mg | ORAL_TABLET | Freq: Three times a day (TID) | ORAL | 0 refills | Status: DC | PRN

## 2018-08-11 MED ORDER — IBUPROFEN 600 MG PO TABS: 600.0000 mg | ORAL_TABLET | Freq: Four times a day (QID) | ORAL | 0 refills | Status: DC | PRN

## 2018-08-11 MED ORDER — METHOCARBAMOL 500 MG PO TABS
1000.0000 mg | ORAL_TABLET | Freq: Once | ORAL | Status: AC
  Administered 2018-08-11: 1000 mg via ORAL
  Filled 2018-08-11: qty 2

## 2018-08-11 NOTE — ED Triage Notes (Signed)
Patient arrived by self from home. Pt c/o shoulder and arm pain that started a couple weeks ago. Pt has difficulty moving his arm. Pt doesn't recall anything that could of caused the pain. Pt rates pain 06/10.

## 2018-08-11 NOTE — ED Notes (Signed)
Patient given discharge teaching and verbalized understanding. Patient ambulated out of ED with a steady gait. 

## 2018-08-11 NOTE — ED Provider Notes (Signed)
[facility]-EMERGENCY DEPT Provider Note   CSN: [id number] Arrival date & time: 08/11/18  4098     History   Chief Complaint Chief Complaint  Patient presents with  . Shoulder Pain    HPI COLBIN JOVEL is a 22 y.o. male.  HPI Patient states he has been doing a lot of heavy lifting at work and thinks he may have injured his right shoulder 2 weeks ago.  Was improving and then worsened over the last couple days.  Pain is worse with movement.  No difficulty breathing or chest pain.  No numbness or weakness. Past Medical History:  Diagnosis Date  . Current smoker   . History of substance use   . Seizures [facility])     Patient Active Problem List   Diagnosis Date Noted  . Cannabis abuse   . 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 Panic attacks 09/15/2016  . Bradycardia   . OD (overdose of drug), intentional self-harm, initial encounter (HCC) 09/10/2016  . Bradycardia, drug induced 09/10/2016  . Hypotension due to drugs 09/10/2016  . Overdose 09/10/2016  . Generalized abdominal pain   . Hyperkalemia   . Uncontrollable vomiting   . Seizure disorder (HCC) 05/26/2016  . Cannabinoid hyperemesis syndrome (HCC) 05/26/2016  . Hypokalemia due to loss of potassium 05/26/2016  . Tobacco dependence 05/26/2016  . Nausea and vomiting 05/26/2016  . Leukocytosis 12/27/2015  . Abnormal finding on MRI of brain   . Pachymeningitis     History reviewed. No pertinent surgical history.      Home Medications    Prior to Admission medications   Medication Sig Start Date End Date Taking? Authorizing Provider  diphenhydrAMINE-APAP, sleep, (GOODY PM PO) Take 1 Package by mouth at bedtime.    [provider]  famotidine (PEPCID AC) 10 MG chewable tablet Chew 10 mg by mouth as needed for heartburn.    [provider]  FLUoxetine (PROZAC) 20 MG capsule Take 1 capsule (20 mg total) by mouth daily.  For depression Patient not taking: Reported on 02/24/2017 09/21/16   Armandina Stammer I, NP  hydrOXYzine (ATARAX/VISTARIL) 25 MG tablet Take 1 tablet (25 mg total) by mouth 3 (three) times daily as needed for anxiety. 09/20/16   Armandina Stammer I, NP  ibuprofen (ADVIL,MOTRIN) 600 MG tablet Take 1 tablet (600 mg total) by mouth every 6 (six) hours as needed. 08/11/18   Loren Racer, MD  levETIRAcetam (KEPPRA) 500 MG tablet Take 1 tablet (500 mg total) by mouth 2 (two) times daily. For seizure activities 06/06/17   Jene Every, MD  levETIRAcetam (KEPPRA) 500 MG tablet Take 1 tablet (500 mg total) by mouth 2 (two) times daily. 05/25/18 07/24/18  Joy, Shawn C, PA-C  methocarbamol (ROBAXIN) 500 MG tablet Take 2 tablets (1,000 mg total) by mouth every 8 (eight) hours as needed for muscle spasms. 08/11/18   Loren Racer, MD  nicotine polacrilex (NICORETTE) 2 MG gum Take 1 each (2 mg total) by mouth as needed for smoking cessation. 09/20/16   Armandina Stammer I, NP  ondansetron (ZOFRAN) 4 MG tablet Take 1 tablet (4 mg total) by mouth every 6 (six) hours. 05/25/18   Hedges, Tinnie Gens, PA-C  ranitidine (ZANTAC) 150 MG capsule Take 1 capsule (150 mg total) by mouth daily. 05/25/18   Hedges, Tinnie Gens, PA-C  traZODone (DESYREL) 50 MG tablet Take 1 tablet (50 mg) at bedtime: For insomnia 09/20/16   Sanjuana Kava, NP  Family History Family History  Problem Relation Age of Onset  . Seizures Father   . Seizures Brother     Social History Social History   Tobacco Use  . Smoking status: Current Every Day Smoker    Packs/day: 1.00    Years: 10.00    Pack years: 10.00    Types: Cigarettes  . Smokeless tobacco: Never Used  Substance Use Topics  . Alcohol use: Yes    Comment: rare use, but binges occasionally  . Drug use: Yes    Types: Marijuana    Comment: every other day     Allergies   Dexamethasone and Prednisone   Review of Systems Review of Systems  Constitutional: Negative for chills and  fever.  Respiratory: Negative for cough and shortness of breath.   Cardiovascular: Negative for chest pain.  Gastrointestinal: Negative for abdominal pain and nausea.  Musculoskeletal: Positive for myalgias and neck pain. Negative for arthralgias.  Skin: Negative for rash and wound.  Neurological: Negative for dizziness, weakness, light-headedness, numbness and headaches.  All other systems reviewed and are negative.    Physical Exam Updated Vital Signs BP 123/78   Pulse 78   Temp (!) 97.4 F (36.3 C) (Oral)   Resp 12   Ht 6\' 1"  (1.854 m)   Wt 81.6 kg   SpO2 99%   BMI 23.75 kg/m   Physical Exam  Constitutional: He is oriented to person, place, and time. He appears well-developed and well-nourished. No distress.  HENT:  Head: Normocephalic and atraumatic.  Mouth/Throat: Oropharynx is clear and moist. No oropharyngeal exudate.  Eyes: Pupils are equal, round, and reactive to light. EOM are normal.  Neck: Normal range of motion. Neck supple.  No midline tenderness to palpation.  Patient with right-sided trapezius spasm and tenderness  Cardiovascular: Normal rate and regular rhythm.  Pulmonary/Chest: Effort normal and breath sounds normal.  Abdominal: Soft. Bowel sounds are normal. There is no tenderness. There is no rebound and no guarding.  Musculoskeletal: Normal range of motion. He exhibits no edema or tenderness.  Full range of motion of the right shoulder without deformity, erythema or warmth.  No tenderness to palpation of the right shoulder.  Full range of motion of the right elbow without pain.  Distal pulses are 2+.  No upper or lower extremity swelling.  No midline thoracic or lumbar tenderness.  Neurological: He is alert and oriented to person, place, and time.  5/5 grip strength bilaterally.  Sensation fully intact.  Skin: Skin is warm and dry. Capillary refill takes less than 2 seconds. No rash noted. He is not diaphoretic. No erythema.  Psychiatric: He has a normal  mood and affect. His behavior is normal.  Nursing note and vitals reviewed.    ED Treatments / Results  Labs (all labs ordered are listed, but only abnormal results are displayed) Labs Reviewed - No data to display  EKG None  Radiology No results found.  Procedures Procedures (including critical care time)  Medications Ordered in ED Medications  ketorolac (TORADOL) injection 60 mg (has no administration in time range)  methocarbamol (ROBAXIN) tablet 1,000 mg (1,000 mg Oral Given 08/11/18 0848)     Initial Impression / Assessment and Plan / ED Course  I have reviewed the triage vital signs and the nursing notes.  Pertinent labs & imaging results that were available during my care of the patient were reviewed by me and considered in my medical decision making (see chart for details).  Patient with right trapezius tenderness to palpation.  Likely due to muscle strain.  Will treat symptomatically.  Final Clinical Impressions(s) / ED Diagnoses   Final diagnoses:  Trapezius strain, right, initial encounter    ED Discharge Orders         Ordered    ibuprofen (ADVIL,MOTRIN) 600 MG tablet  Every 6 hours PRN     08/11/18 0848    methocarbamol (ROBAXIN) 500 MG tablet  Every 8 hours PRN     08/11/18 0848           Loren Racer, MD 08/11/18 [phone]

## 2018-12-06 ENCOUNTER — Emergency Department (HOSPITAL_COMMUNITY)
Admission: EM | Admit: 2018-12-06 | Discharge: 2018-12-06 | Disposition: A | Discharge status: Home / Self Care | Payer: Self-pay | Attending: Emergency Medicine | Admitting: Emergency Medicine

## 2018-12-06 ENCOUNTER — Other Ambulatory Visit: Payer: Self-pay

## 2018-12-06 DIAGNOSIS — F17210 Nicotine dependence, cigarettes, uncomplicated: Secondary | ICD-10-CM | POA: Insufficient documentation

## 2018-12-06 DIAGNOSIS — R112 Nausea with vomiting, unspecified: Secondary | ICD-10-CM | POA: Insufficient documentation

## 2018-12-06 DIAGNOSIS — Z79899 Other long term (current) drug therapy: Secondary | ICD-10-CM | POA: Insufficient documentation

## 2018-12-06 LAB — COMPREHENSIVE METABOLIC PANEL
ALK PHOS: 81 U/L (ref 38–126)
ALT: 28 U/L (ref 0–44)
AST: 38 U/L (ref 15–41)
Albumin: 5 g/dL (ref 3.5–5.0)
Anion gap: 17 — ABNORMAL HIGH (ref 5–15)
BUN: 17 mg/dL (ref 6–20)
CO2: 20 mmol/L — ABNORMAL LOW (ref 22–32)
Calcium: 10.1 mg/dL (ref 8.9–10.3)
Chloride: 102 mmol/L (ref 98–111)
Creatinine, Ser: 0.93 mg/dL (ref 0.61–1.24)
GFR calc Af Amer: >60 mL/min (ref >60)
GFR calc non Af Amer: >60 mL/min (ref >60)
Glucose, Bld: 153 mg/dL — ABNORMAL HIGH (ref 70–99)
Potassium: 3.9 mmol/L (ref 3.5–5.1)
SODIUM: 139 mmol/L (ref 135–145)
Total Bilirubin: 0.7 mg/dL (ref 0.3–1.2)
Total Protein: 7.9 g/dL (ref 6.5–8.1)

## 2018-12-06 LAB — CBC
HCT: 48.3 % (ref 39.0–52.0)
Hemoglobin: 16.2 g/dL (ref 13.0–17.0)
MCH: 29.9 pg (ref 26.0–34.0)
MCHC: 33.5 g/dL (ref 30.0–36.0)
MCV: 89.3 fL (ref 80.0–100.0)
Platelets: 322 10*3/uL (ref 150–400)
RBC: 5.41 MIL/uL (ref 4.22–5.81)
RDW: 13.3 % (ref 11.5–15.5)
WBC: 24.6 10*3/uL — ABNORMAL HIGH (ref 4.0–10.5)
nRBC: 0 % (ref 0.0–0.2)

## 2018-12-06 LAB — LIPASE, BLOOD: Lipase: 24 U/L (ref 11–51)

## 2018-12-06 MED ORDER — SODIUM CHLORIDE 0.9% FLUSH
3.0000 mL | Freq: Once | INTRAVENOUS | Status: AC
  Administered 2018-12-06: 3 mL via INTRAVENOUS

## 2018-12-06 MED ORDER — ONDANSETRON HCL 4 MG/2ML IJ SOLN
4.0000 mg | Freq: Once | INTRAMUSCULAR | Status: AC | PRN
  Administered 2018-12-06: 4 mg via INTRAVENOUS
  Filled 2018-12-06: qty 2

## 2018-12-06 MED ORDER — ONDANSETRON 4 MG PO TBDP: 4.0000 mg | ORAL_TABLET | Freq: Three times a day (TID) | ORAL | 0 refills | Status: DC | PRN

## 2018-12-06 NOTE — ED Triage Notes (Signed)
Patient c/o N/V since 5 pm yesterday. Patient actively vomiting in triage

## 2018-12-06 NOTE — ED Provider Notes (Signed)
[facility]   CSN: [id number] Arrival date & time: 12/06/18  0108     History   Chief Complaint Chief Complaint  Patient presents with  . N/V    HPI Jaime Nixon is a 23 y.o. male.  Patient presents to the emergency department with a chief complaint of nausea and vomiting.  States symptoms started last night around 5 PM.  Reports having had persistent vomiting.  Has not taken anything for the symptoms.  Denies any lower abdominal pain.  Reports that he does have some crampy discomfort, which she attributes to the vomiting.  Denies any fever, chills, or cough.  Denies any other associated symptoms.  He did smoke marijuana yesterday.  States he has had this happen in the past.  The history is provided by the patient. No language interpreter was used.    Past Medical History:  Diagnosis Date  . Current smoker   . History of substance use   . Seizures [facility](HCC)     Patient Active Problem List   Diagnosis Date Noted  . Cannabis abuse   . 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 Panic attacks 09/15/2016  . Bradycardia   . OD (overdose of drug), intentional self-harm, initial encounter (HCC) 09/10/2016  . Bradycardia, drug induced 09/10/2016  . Hypotension due to drugs 09/10/2016  . Overdose 09/10/2016  . Generalized abdominal pain   . Hyperkalemia   . Uncontrollable vomiting   . Seizure disorder (HCC) 05/26/2016  . Cannabinoid hyperemesis syndrome (HCC) 05/26/2016  . Hypokalemia due to loss of potassium 05/26/2016  . Tobacco dependence 05/26/2016  . Nausea and vomiting 05/26/2016  . Leukocytosis 12/27/2015  . Abnormal finding on MRI of brain   . Pachymeningitis     No past surgical history on file.      Home Medications    Prior to Admission medications   Medication Sig Start Date End Date Taking? Authorizing Provider  diphenhydrAMINE-APAP, sleep,  (GOODY PM PO) Take 1 Package by mouth at bedtime.    [provider]  famotidine (PEPCID AC) 10 MG chewable tablet Chew 10 mg by mouth as needed for heartburn.    [provider]  FLUoxetine (PROZAC) 20 MG capsule Take 1 capsule (20 mg total) by mouth daily. For depression Patient not taking: Reported on 02/24/2017 09/21/16   Armandina StammerNwoko, Agnes I, NP  hydrOXYzine (ATARAX/VISTARIL) 25 MG tablet Take 1 tablet (25 mg total) by mouth 3 (three) times daily as needed for anxiety. 09/20/16   Armandina StammerNwoko, Agnes I, NP  ibuprofen (ADVIL,MOTRIN) 600 MG tablet Take 1 tablet (600 mg total) by mouth every 6 (six) hours as needed. 08/11/18   Loren RacerYelverton, David, MD  levETIRAcetam (KEPPRA) 500 MG tablet Take 1 tablet (500 mg total) by mouth 2 (two) times daily. For seizure activities 06/06/17   Jene EveryKinner, Maudy Yonan, MD  levETIRAcetam (KEPPRA) 500 MG tablet Take 1 tablet (500 mg total) by mouth 2 (two) times daily. 05/25/18 07/24/18  Joy, Shawn C, PA-C  methocarbamol (ROBAXIN) 500 MG tablet Take 2 tablets (1,000 mg total) by mouth every 8 (eight) hours as needed for muscle spasms. 08/11/18   Loren RacerYelverton, David, MD  nicotine polacrilex (NICORETTE) 2 MG gum Take 1 each (2 mg total) by mouth as needed for smoking cessation. 09/20/16   Armandina StammerNwoko, Agnes I, NP  ondansetron (ZOFRAN ODT) 4 MG disintegrating tablet Take 1 tablet (4 mg total) by mouth every 8 (eight) hours  as needed for nausea or vomiting. 12/06/18   Roxy Horseman, PA-C  ondansetron (ZOFRAN) 4 MG tablet Take 1 tablet (4 mg total) by mouth every 6 (six) hours. 05/25/18   Hedges, Tinnie Gens, PA-C  ranitidine (ZANTAC) 150 MG capsule Take 1 capsule (150 mg total) by mouth daily. 05/25/18   Hedges, Tinnie Gens, PA-C  traZODone (DESYREL) 50 MG tablet Take 1 tablet (50 mg) at bedtime: For insomnia 09/20/16   Sanjuana Kava, NP    Family History Family History  Problem Relation Age of Onset  . Seizures Father   . Seizures Brother     Social History Social History   Tobacco Use    . Smoking status: Current Every Day Smoker    Packs/day: 1.00    Years: 10.00    Pack years: 10.00    Types: Cigarettes  . Smokeless tobacco: Never Used  Substance Use Topics  . Alcohol use: Yes    Comment: rare use, but binges occasionally  . Drug use: Yes    Types: Marijuana    Comment: every other day     Allergies   Dexamethasone and Prednisone   Review of Systems Review of Systems  All other systems reviewed and are negative.    Physical Exam Updated Vital Signs BP (!) 127/93 (BP Location: Right Arm)   Pulse (!) 101   Temp (!) 97.4 F (36.3 C) (Oral)   Resp 20   Ht 6' (1.829 m)   Wt 90.7 kg   SpO2 98%   BMI 27.12 kg/m   Physical Exam Vitals signs and nursing note reviewed.  Constitutional:      Appearance: He is well-developed.  HENT:     Head: Normocephalic and atraumatic.  Eyes:     General: No scleral icterus.       Right eye: No discharge.        Left eye: No discharge.     Conjunctiva/sclera: Conjunctivae normal.     Pupils: Pupils are equal, round, and reactive to light.  Neck:     Musculoskeletal: Normal range of motion and neck supple.     Vascular: No JVD.  Cardiovascular:     Rate and Rhythm: Normal rate and regular rhythm.     Heart sounds: Normal heart sounds. No murmur. No friction rub. No gallop.   Pulmonary:     Effort: Pulmonary effort is normal. No respiratory distress.     Breath sounds: Normal breath sounds. No wheezing or rales.  Chest:     Chest wall: No tenderness.  Abdominal:     General: There is no distension.     Palpations: Abdomen is soft. There is no mass.     Tenderness: There is no abdominal tenderness. There is no guarding or rebound.     Comments: No focal abdominal tenderness, no RLQ tenderness or pain at McBurney's point, no RUQ tenderness or Murphy's sign, no left-sided abdominal tenderness, no fluid wave, or signs of peritonitis   Musculoskeletal: Normal range of motion.        General: No tenderness.   Skin:    General: Skin is warm and dry.  Neurological:     Mental Status: He is alert and oriented to person, place, and time.  Psychiatric:        Behavior: Behavior normal.        Thought Content: Thought content normal.        Judgment: Judgment normal.      ED Treatments / Results  Labs (  all labs ordered are listed, but only abnormal results are displayed) Labs Reviewed  COMPREHENSIVE METABOLIC PANEL - Abnormal; Notable for the following components:      Result Value   CO2 20 (*)    Glucose, Bld 153 (*)    Anion gap 17 (*)    All other components within normal limits  CBC - Abnormal; Notable for the following components:   WBC 24.6 (*)    All other components within normal limits  LIPASE, BLOOD    EKG None  Radiology No results found.  Procedures Procedures (including critical care time)  Medications Ordered in ED Medications  sodium chloride flush (NS) 0.9 % injection 3 mL (3 mLs Intravenous Given 12/06/18 0132)  ondansetron (ZOFRAN) injection 4 mg (4 mg Intravenous Given 12/06/18 0127)     Initial Impression / Assessment and Plan / ED Course  I have reviewed the triage vital signs and the nursing notes.  Pertinent labs & imaging results that were available during my care of the patient were reviewed by me and considered in my medical decision making (see chart for details).     Patient with nausea and vomiting.  Onset yesterday.  Abdomen is soft and nontender, doubt surgical or acute abdomen.  He is noted to have an elevated leukocytosis, however his abdominal exam is very reassuring.  I do not feel that he needs advanced imaging at this time.  Will recommend watchful waiting.  We will also prescribe Zofran for home.  Return precautions given.  Patient discussed with Dr. Clarene Duke, who agrees with plan.  Final Clinical Impressions(s) / ED Diagnoses   Final diagnoses:  Nausea and vomiting, intractability of vomiting not specified, unspecified vomiting type     ED Discharge Orders         Ordered    ondansetron (ZOFRAN ODT) 4 MG disintegrating tablet  Every 8 hours PRN     12/06/18 0434           Roxy Horseman, PA-C 12/06/18 0436    Little, Ambrose Finland, MD 12/06/18 [phone]

## 2018-12-06 NOTE — ED Notes (Signed)
Pt given ginger ale per verbal order from [place], [place].

## 2018-12-08 ENCOUNTER — Encounter (HOSPITAL_COMMUNITY): Payer: Self-pay | Admitting: *Deleted

## 2018-12-08 ENCOUNTER — Other Ambulatory Visit: Payer: Self-pay

## 2018-12-08 ENCOUNTER — Emergency Department (HOSPITAL_COMMUNITY): Payer: Self-pay

## 2018-12-08 ENCOUNTER — Emergency Department (HOSPITAL_COMMUNITY)
Admission: EM | Admit: 2018-12-08 | Discharge: 2018-12-08 | Disposition: A | Discharge status: Home / Self Care | Payer: Self-pay | Attending: Emergency Medicine | Admitting: Emergency Medicine

## 2018-12-08 DIAGNOSIS — F17210 Nicotine dependence, cigarettes, uncomplicated: Secondary | ICD-10-CM | POA: Insufficient documentation

## 2018-12-08 DIAGNOSIS — K2900 Acute gastritis without bleeding: Secondary | ICD-10-CM | POA: Insufficient documentation

## 2018-12-08 DIAGNOSIS — Z79899 Other long term (current) drug therapy: Secondary | ICD-10-CM | POA: Insufficient documentation

## 2018-12-08 LAB — RAPID URINE DRUG SCREEN, HOSP PERFORMED
AMPHETAMINES: NOT DETECTED
Barbiturates: NOT DETECTED
Benzodiazepines: NOT DETECTED
Cocaine: NOT DETECTED
Opiates: NOT DETECTED
Tetrahydrocannabinol: POSITIVE — AB

## 2018-12-08 LAB — COMPREHENSIVE METABOLIC PANEL
ALT: 21 U/L (ref 0–44)
AST: 23 U/L (ref 15–41)
Albumin: 5 g/dL (ref 3.5–5.0)
Alkaline Phosphatase: 72 U/L (ref 38–126)
Anion gap: 15 (ref 5–15)
BILIRUBIN TOTAL: 1 mg/dL (ref 0.3–1.2)
BUN: 20 mg/dL (ref 6–20)
CO2: 19 mmol/L — ABNORMAL LOW (ref 22–32)
Calcium: 10.1 mg/dL (ref 8.9–10.3)
Chloride: 104 mmol/L (ref 98–111)
Creatinine, Ser: 0.9 mg/dL (ref 0.61–1.24)
GFR calc Af Amer: >60 mL/min (ref >60)
GFR calc non Af Amer: >60 mL/min (ref >60)
Glucose, Bld: 131 mg/dL — ABNORMAL HIGH (ref 70–99)
Potassium: 3.8 mmol/L (ref 3.5–5.1)
Sodium: 138 mmol/L (ref 135–145)
Total Protein: 7.8 g/dL (ref 6.5–8.1)

## 2018-12-08 LAB — URINALYSIS, ROUTINE W REFLEX MICROSCOPIC
Bilirubin Urine: NEGATIVE
Glucose, UA: NEGATIVE mg/dL
Hgb urine dipstick: NEGATIVE
Ketones, ur: 5 mg/dL — AB
Leukocytes, UA: NEGATIVE
Nitrite: NEGATIVE
Protein, ur: NEGATIVE mg/dL
SPECIFIC GRAVITY, URINE: 1.026 (ref 1.005–1.030)
pH: 7 (ref 5.0–8.0)

## 2018-12-08 LAB — CBC
HCT: 47.4 % (ref 39.0–52.0)
Hemoglobin: 15.9 g/dL (ref 13.0–17.0)
MCH: 29.5 pg (ref 26.0–34.0)
MCHC: 33.5 g/dL (ref 30.0–36.0)
MCV: 87.9 fL (ref 80.0–100.0)
Platelets: 304 10*3/uL (ref 150–400)
RBC: 5.39 MIL/uL (ref 4.22–5.81)
RDW: 13.2 % (ref 11.5–15.5)
WBC: 16.1 10*3/uL — ABNORMAL HIGH (ref 4.0–10.5)
nRBC: 0 % (ref 0.0–0.2)

## 2018-12-08 LAB — LIPASE, BLOOD: Lipase: 24 U/L (ref 11–51)

## 2018-12-08 MED ORDER — LIDOCAINE VISCOUS HCL 2 % MT SOLN
15.0000 mL | Freq: Once | OROMUCOSAL | Status: AC
  Administered 2018-12-08: 15 mL via ORAL
  Filled 2018-12-08: qty 15

## 2018-12-08 MED ORDER — ALUM & MAG HYDROXIDE-SIMETH 200-200-20 MG/5ML PO SUSP
30.0000 mL | Freq: Once | ORAL | Status: AC
  Administered 2018-12-08: 30 mL via ORAL
  Filled 2018-12-08: qty 30

## 2018-12-08 MED ORDER — IOPAMIDOL (ISOVUE-300) INJECTION 61%
100.0000 mL | Freq: Once | INTRAVENOUS | Status: AC | PRN
  Administered 2018-12-08: 100 mL via INTRAVENOUS

## 2018-12-08 MED ORDER — SUCRALFATE 1 G PO TABS: 1.0000 g | ORAL_TABLET | Freq: Three times a day (TID) | ORAL | 0 refills | Status: DC

## 2018-12-08 MED ORDER — SODIUM CHLORIDE 0.9% FLUSH: 3.0000 mL | Freq: Once | INTRAVENOUS | Status: DC

## 2018-12-08 MED ORDER — SODIUM CHLORIDE 0.9 % IV BOLUS
1000.0000 mL | Freq: Once | INTRAVENOUS | Status: AC
  Administered 2018-12-08: 1000 mL via INTRAVENOUS

## 2018-12-08 MED ORDER — CAPSAICIN 0.025 % EX CREA
TOPICAL_CREAM | Freq: Two times a day (BID) | CUTANEOUS | Status: DC
  Administered 2018-12-08: 10:00:00 via TOPICAL
  Filled 2018-12-08: qty 60

## 2018-12-08 MED ORDER — PANTOPRAZOLE SODIUM 40 MG IV SOLR
40.0000 mg | Freq: Once | INTRAVENOUS | Status: AC
  Administered 2018-12-08: 40 mg via INTRAVENOUS
  Filled 2018-12-08: qty 40

## 2018-12-08 NOTE — ED Notes (Signed)
  Pt transported to ct 

## 2018-12-08 NOTE — ED Provider Notes (Signed)
[facility]   CSN: [id number] Arrival date & time: 12/08/18  [id number]     History   Chief Complaint Chief Complaint  Patient presents with  . Abdominal Pain    HPI Coralie Keenshomas J Mill is a 23 y.o. male.  Patient is a 23 year old male who presents with abdominal pain.  He reports a 2 to 3-day history of pain in his epigastrium and right upper quadrant.  He also says pain goes down to his lower abdomen.  He has had associated nausea and vomiting.  No diarrhea or change in bowels.  No fevers.  No hematemesis.  He was seen here for similar symptoms 2 days ago and treated symptomatically.  It was not felt that imaging was warranted at that time.  He currently denies any alcohol use or drug use although there is a reported history on the chart review of marijuana use.  Patient was given Zofran fentanyl and ketamine by EMS with some improvement in symptoms.     Past Medical History:  Diagnosis Date  . Current smoker   . History of substance use   . Seizures [facility](HCC)     Patient Active Problem List   Diagnosis Date Noted  . Cannabis abuse   . 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 Panic attacks 09/15/2016  . Bradycardia   . OD (overdose of drug), intentional self-harm, initial encounter (HCC) 09/10/2016  . Bradycardia, drug induced 09/10/2016  . Hypotension due to drugs 09/10/2016  . Overdose 09/10/2016  . Generalized abdominal pain   . Hyperkalemia   . Uncontrollable vomiting   . Seizure disorder (HCC) 05/26/2016  . Cannabinoid hyperemesis syndrome (HCC) 05/26/2016  . Hypokalemia due to loss of potassium 05/26/2016  . Tobacco dependence 05/26/2016  . Nausea and vomiting 05/26/2016  . Leukocytosis 12/27/2015  . Abnormal finding on MRI of brain   . Pachymeningitis     History reviewed. No pertinent surgical history.      Home Medications    Prior to Admission  medications   Medication Sig Start Date End Date Taking? Authorizing Provider  acetaminophen (TYLENOL) 500 MG tablet Take 500 mg by mouth every 6 (six) hours as needed for mild pain.   Yes [provider]  ondansetron (ZOFRAN ODT) 4 MG disintegrating tablet Take 1 tablet (4 mg total) by mouth every 8 (eight) hours as needed for nausea or vomiting. 12/06/18  Yes Roxy HorsemanBrowning, Robert, PA-C  ranitidine (ZANTAC) 150 MG capsule Take 1 capsule (150 mg total) by mouth daily. 05/25/18  Yes Hedges, Tinnie GensJeffrey, PA-C  FLUoxetine (PROZAC) 20 MG capsule Take 1 capsule (20 mg total) by mouth daily. For depression Patient not taking: Reported on 02/24/2017 09/21/16   Armandina StammerNwoko, Agnes I, NP  hydrOXYzine (ATARAX/VISTARIL) 25 MG tablet Take 1 tablet (25 mg total) by mouth 3 (three) times daily as needed for anxiety. Patient not taking: Reported on 12/08/2018 09/20/16   Armandina StammerNwoko, Agnes I, NP  ibuprofen (ADVIL,MOTRIN) 600 MG tablet Take 1 tablet (600 mg total) by mouth every 6 (six) hours as needed. Patient not taking: Reported on 12/08/2018 08/11/18   Loren RacerYelverton, David, MD  levETIRAcetam (KEPPRA) 500 MG tablet Take 1 tablet (500 mg total) by mouth 2 (two) times daily. For seizure activities Patient not taking: Reported on 12/08/2018 06/06/17   Jene EveryKinner, Robert, MD  levETIRAcetam (KEPPRA) 500 MG tablet Take 1 tablet (500 mg total) by mouth 2 (two) times daily. Patient not taking:  Reported on 12/08/2018 05/25/18 07/24/18  Anselm Pancoast, PA-C  methocarbamol (ROBAXIN) 500 MG tablet Take 2 tablets (1,000 mg total) by mouth every 8 (eight) hours as needed for muscle spasms. Patient not taking: Reported on 12/08/2018 08/11/18   Loren Racer, MD  nicotine polacrilex (NICORETTE) 2 MG gum Take 1 each (2 mg total) by mouth as needed for smoking cessation. Patient not taking: Reported on 12/08/2018 09/20/16   Armandina Stammer I, NP  ondansetron (ZOFRAN) 4 MG tablet Take 1 tablet (4 mg total) by mouth every 6 (six) hours. Patient not taking:  Reported on 12/08/2018 05/25/18   Hedges, Tinnie Gens, PA-C  sucralfate (CARAFATE) 1 g tablet Take 1 tablet (1 g total) by mouth 4 (four) times daily -  with meals and at bedtime. 12/08/18   Rolan Bucco, MD  traZODone (DESYREL) 50 MG tablet Take 1 tablet (50 mg) at bedtime: For insomnia Patient not taking: Reported on 12/08/2018 09/20/16   Sanjuana Kava, NP    Family History Family History  Problem Relation Age of Onset  . Seizures Father   . Seizures Brother     Social History Social History   Tobacco Use  . Smoking status: Current Every Day Smoker    Packs/day: 1.00    Years: 10.00    Pack years: 10.00    Types: Cigarettes  . Smokeless tobacco: Never Used  Substance Use Topics  . Alcohol use: Not Currently    Comment: rare use, but binges occasionally  . Drug use: Yes    Types: Marijuana    Comment: every other day     Allergies   Dexamethasone and Prednisone   Review of Systems Review of Systems  Constitutional: Negative for chills, diaphoresis, fatigue and fever.  HENT: Negative for congestion, rhinorrhea and sneezing.   Eyes: Negative.   Respiratory: Negative for cough, chest tightness and shortness of breath.   Cardiovascular: Negative for chest pain and leg swelling.  Gastrointestinal: Positive for abdominal pain, nausea and vomiting. Negative for blood in stool and diarrhea.  Genitourinary: Negative for difficulty urinating, flank pain, frequency and hematuria.  Musculoskeletal: Negative for arthralgias and back pain.  Skin: Negative for rash.  Neurological: Negative for dizziness, speech difficulty, weakness, numbness and headaches.     Physical Exam Updated Vital Signs BP 130/77   Pulse 68   Temp 98.2 F (36.8 C) (Oral)   Resp 16   Ht 6' (1.829 m)   Wt 90 kg   SpO2 98%   BMI 26.91 kg/m   Physical Exam Constitutional:      Appearance: He is well-developed.  HENT:     Head: Normocephalic and atraumatic.  Eyes:     Pupils: Pupils are equal,  round, and reactive to light.  Neck:     Musculoskeletal: Normal range of motion and neck supple.  Cardiovascular:     Rate and Rhythm: Normal rate and regular rhythm.     Heart sounds: Normal heart sounds.  Pulmonary:     Effort: Pulmonary effort is normal. No respiratory distress.     Breath sounds: Normal breath sounds. No wheezing or rales.  Chest:     Chest wall: No tenderness.  Abdominal:     General: Bowel sounds are normal.     Palpations: Abdomen is soft.     Tenderness: There is abdominal tenderness in the right upper quadrant, right lower quadrant and epigastric area. There is no guarding or rebound.  Musculoskeletal: Normal range of motion.  Lymphadenopathy:  Cervical: No cervical adenopathy.  Skin:    General: Skin is warm and dry.     Findings: No rash.  Neurological:     Mental Status: He is alert and oriented to person, place, and time.      ED Treatments / Results  Labs (all labs ordered are listed, but only abnormal results are displayed) Labs Reviewed  COMPREHENSIVE METABOLIC PANEL - Abnormal; Notable for the following components:      Result Value   CO2 19 (*)    Glucose, Bld 131 (*)    All other components within normal limits  CBC - Abnormal; Notable for the following components:   WBC 16.1 (*)    All other components within normal limits  URINALYSIS, ROUTINE W REFLEX MICROSCOPIC - Abnormal; Notable for the following components:   APPearance CLOUDY (*)    Ketones, ur 5 (*)    All other components within normal limits  RAPID URINE DRUG SCREEN, HOSP PERFORMED - Abnormal; Notable for the following components:   Tetrahydrocannabinol POSITIVE (*)    All other components within normal limits  LIPASE, BLOOD    EKG None  Radiology Ct Abdomen Pelvis W Contrast  Result Date: 12/08/2018 CLINICAL DATA:  23 year old male with history of acute generalized abdominal pain. EXAM: CT ABDOMEN AND PELVIS WITH CONTRAST TECHNIQUE: Multidetector CT imaging of  the abdomen and pelvis was performed using the standard protocol following bolus administration of intravenous contrast. CONTRAST:  100mL ISOVUE-300 IOPAMIDOL (ISOVUE-300) INJECTION 61% COMPARISON:  CT the abdomen and pelvis 05/25/2018. FINDINGS: Lower chest: Unremarkable. Hepatobiliary: No suspicious cystic or solid hepatic lesions. No intra or extrahepatic biliary ductal dilatation. Gallbladder is normal in appearance. Pancreas: No pancreatic mass. No pancreatic ductal dilatation. No pancreatic or peripancreatic fluid or inflammatory changes. Spleen: Unremarkable. Adrenals/Urinary Tract: Bilateral kidneys and bilateral adrenal glands are normal in appearance. No hydroureteronephrosis. Urinary bladder is normal in appearance. Stomach/Bowel: Normal appearance of the stomach. No pathologic dilatation of small bowel or colon. Normal appendix. Vascular/Lymphatic: No atherosclerotic calcifications are noted in the abdominal aorta or pelvic vasculature. No lymphadenopathy noted in the abdomen or pelvis. Reproductive: Prostate gland and seminal vesicles are unremarkable in appearance. Other: No significant volume of ascites.  No pneumoperitoneum. Musculoskeletal: There are no aggressive appearing lytic or blastic lesions noted in the visualized portions of the skeleton. IMPRESSION: 1. No acute findings are noted in the abdomen or pelvis to account for the patient's symptoms. 2. Normal appendix. Electronically Signed   By: Trudie Reedaniel  Entrikin M.D.   On: 12/08/2018 09:31    Procedures Procedures (including critical care time)  Medications Ordered in ED Medications  sodium chloride flush (NS) 0.9 % injection 3 mL (has no administration in time range)  capsaicin (ZOSTRIX) 0.025 % cream ( Topical Given 12/08/18 0940)  sodium chloride 0.9 % bolus 1,000 mL (0 mLs Intravenous Stopped 12/08/18 0934)  pantoprazole (PROTONIX) injection 40 mg (40 mg Intravenous Given 12/08/18 0832)  iopamidol (ISOVUE-300) 61 % injection 100 mL  (100 mLs Intravenous Contrast Given 12/08/18 0920)  alum & mag hydroxide-simeth (MAALOX/MYLANTA) 200-200-20 MG/5ML suspension 30 mL (30 mLs Oral Given 12/08/18 0948)    And  lidocaine (XYLOCAINE) 2 % viscous mouth solution 15 mL (15 mLs Oral Given 12/08/18 0948)     Initial Impression / Assessment and Plan / ED Course  I have reviewed the triage vital signs and the nursing notes.  Pertinent labs & imaging results that were available during my care of the patient were reviewed by me  and considered in my medical decision making (see chart for details).     Pt presents with epigastric pain associated with n/v.  Since this was his second visit, a CT was performed that showed no acute abnormalities.  No evidence of pancreatitis.  Labs are non-concerning.  Feeling better with IVFs and treatment in the ED including GI cocktail and capsaicin cream.  I feel that this is likely gastritis.  He tells me now that it started after doing some "fireball shots".  I also discussed cannibis hyperemesis syndrome with him and counseled him on cessation of marijuana.  Will d/c with him continuing to use zantac, zofran and I added carafate and capsaicin cream.  Return precautions given.  Final Clinical Impressions(s) / ED Diagnoses   Final diagnoses:  Acute gastritis without hemorrhage, unspecified gastritis type    ED Discharge Orders         Ordered    sucralfate (CARAFATE) 1 g tablet  3 times daily with meals & bedtime     12/08/18 1208           Rolan Bucco, MD 12/08/18 1224

## 2018-12-08 NOTE — ED Notes (Signed)
Pt back from CT

## 2018-12-08 NOTE — ED Triage Notes (Signed)
Presents to ed via [facility] c/o abd. With n/v onset sat. States he was seen her on the 2/9 for same and given zofran of which isn't helping. Patient was given 4mg  Zofran, fentanyl and 20 mg ketamine per ems. States it is still hurting. Patient also received 500cc NS

## 2018-12-09 ENCOUNTER — Emergency Department (HOSPITAL_COMMUNITY)
Admission: EM | Admit: 2018-12-09 | Discharge: 2018-12-09 | Disposition: A | Discharge status: Home / Self Care | Payer: Self-pay | Attending: Emergency Medicine | Admitting: Emergency Medicine

## 2018-12-09 ENCOUNTER — Encounter (HOSPITAL_COMMUNITY): Payer: Self-pay | Admitting: Emergency Medicine

## 2018-12-09 ENCOUNTER — Other Ambulatory Visit: Payer: Self-pay

## 2018-12-09 DIAGNOSIS — F329 Major depressive disorder, single episode, unspecified: Secondary | ICD-10-CM | POA: Insufficient documentation

## 2018-12-09 DIAGNOSIS — R109 Unspecified abdominal pain: Secondary | ICD-10-CM

## 2018-12-09 DIAGNOSIS — F17210 Nicotine dependence, cigarettes, uncomplicated: Secondary | ICD-10-CM | POA: Insufficient documentation

## 2018-12-09 DIAGNOSIS — R1010 Upper abdominal pain, unspecified: Secondary | ICD-10-CM | POA: Insufficient documentation

## 2018-12-09 DIAGNOSIS — Z79899 Other long term (current) drug therapy: Secondary | ICD-10-CM | POA: Insufficient documentation

## 2018-12-09 DIAGNOSIS — F1210 Cannabis abuse, uncomplicated: Secondary | ICD-10-CM | POA: Insufficient documentation

## 2018-12-09 DIAGNOSIS — R251 Tremor, unspecified: Secondary | ICD-10-CM | POA: Insufficient documentation

## 2018-12-09 LAB — COMPREHENSIVE METABOLIC PANEL
ALT: 16 U/L (ref 0–44)
AST: 41 U/L (ref 15–41)
Albumin: 4.7 g/dL (ref 3.5–5.0)
Alkaline Phosphatase: 67 U/L (ref 38–126)
Anion gap: 11 (ref 5–15)
BILIRUBIN TOTAL: 2 mg/dL — AB (ref 0.3–1.2)
BUN: 21 mg/dL — ABNORMAL HIGH (ref 6–20)
CO2: 19 mmol/L — ABNORMAL LOW (ref 22–32)
Calcium: 8.6 mg/dL — ABNORMAL LOW (ref 8.9–10.3)
Chloride: 106 mmol/L (ref 98–111)
Creatinine, Ser: 0.88 mg/dL (ref 0.61–1.24)
GFR calc Af Amer: >60 mL/min (ref >60)
GFR calc non Af Amer: >60 mL/min (ref >60)
Glucose, Bld: 109 mg/dL — ABNORMAL HIGH (ref 70–99)
Potassium: 4.5 mmol/L (ref 3.5–5.1)
Sodium: 136 mmol/L (ref 135–145)
TOTAL PROTEIN: 7.4 g/dL (ref 6.5–8.1)

## 2018-12-09 LAB — ETHANOL: Alcohol, Ethyl (B): <10 mg/dL (ref <10)

## 2018-12-09 MED ORDER — LORAZEPAM 1 MG PO TABS
1.0000 mg | ORAL_TABLET | Freq: Once | ORAL | Status: AC
  Administered 2018-12-09: 1 mg via ORAL
  Filled 2018-12-09: qty 1

## 2018-12-09 MED ORDER — KETOROLAC TROMETHAMINE 30 MG/ML IJ SOLN
15.0000 mg | Freq: Once | INTRAMUSCULAR | Status: AC
  Administered 2018-12-09: 15 mg via INTRAVENOUS
  Filled 2018-12-09: qty 1

## 2018-12-09 MED ORDER — SODIUM CHLORIDE 0.9 % IV BOLUS
500.0000 mL | Freq: Once | INTRAVENOUS | Status: AC
  Administered 2018-12-09: 500 mL via INTRAVENOUS

## 2018-12-09 MED ORDER — ONDANSETRON HCL 4 MG/2ML IJ SOLN
4.0000 mg | Freq: Once | INTRAMUSCULAR | Status: AC
  Administered 2018-12-09: 4 mg via INTRAVENOUS
  Filled 2018-12-09: qty 2

## 2018-12-09 MED ORDER — LORAZEPAM 2 MG/ML IJ SOLN
1.0000 mg | Freq: Once | INTRAMUSCULAR | Status: AC
  Administered 2018-12-09: 1 mg via INTRAVENOUS
  Filled 2018-12-09: qty 1

## 2018-12-09 NOTE — Discharge Instructions (Addendum)
Follow-up with the doctors we referred you to for further evaluation and treatment.

## 2018-12-09 NOTE — ED Triage Notes (Signed)
Per EMS pt complaint of seizures related to unable to take home medications (cannot afford them); versed 2.5 mg IV given with EMS. A/O x4.

## 2018-12-09 NOTE — ED Provider Notes (Addendum)
[facility]-EMERGENCY DEPT Provider Note   CSN: [id number] Arrival date & time: 12/09/18  0940     History   Chief Complaint Chief Complaint  Patient presents with  . Seizures    HPI Jaime Nixon is a 23 y.o. male.  Patient presents for evaluation of seizures.  He is noncompliant with Keppra, and he has been seen in the ED with the same complaint multiple times.  Was recently in ED about a week ago evaluated and treated for abdominal pain, at which time he was using alcohol, to get drunk.  He has been taking shots of "fireball."  Also at that time he was considered to have possible hyperemesis cannabinoid syndrome.   HPI  Past Medical History:  Diagnosis Date  . Current smoker   . History of substance use   . Seizures [facility])     Patient Active Problem List   Diagnosis Date Noted  . Cannabis abuse   . 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 Panic attacks 09/15/2016  . Bradycardia   . OD (overdose of drug), intentional self-harm, initial encounter (HCC) 09/10/2016  . Bradycardia, drug induced 09/10/2016  . Hypotension due to drugs 09/10/2016  . Overdose 09/10/2016  . Generalized abdominal pain   . Hyperkalemia   . Uncontrollable vomiting   . Seizure disorder (HCC) 05/26/2016  . Cannabinoid hyperemesis syndrome (HCC) 05/26/2016  . Hypokalemia due to loss of potassium 05/26/2016  . Tobacco dependence 05/26/2016  . Nausea and vomiting 05/26/2016  . Leukocytosis 12/27/2015  . Abnormal finding on MRI of brain   . Pachymeningitis     History reviewed. No pertinent surgical history.      Home Medications    Prior to Admission medications   Medication Sig Start Date End Date Taking? Authorizing Provider  acetaminophen (TYLENOL) 500 MG tablet Take 500 mg by mouth every 6 (six) hours as needed for mild pain.   Yes [provider]  ondansetron (ZOFRAN ODT) 4 MG disintegrating  tablet Take 1 tablet (4 mg total) by mouth every 8 (eight) hours as needed for nausea or vomiting. 12/06/18  Yes Roxy Horseman, PA-C  ranitidine (ZANTAC) 150 MG capsule Take 1 capsule (150 mg total) by mouth daily. 05/25/18  Yes Hedges, Tinnie Gens, PA-C  FLUoxetine (PROZAC) 20 MG capsule Take 1 capsule (20 mg total) by mouth daily. For depression Patient not taking: Reported on 02/24/2017 09/21/16   Armandina Stammer I, NP  hydrOXYzine (ATARAX/VISTARIL) 25 MG tablet Take 1 tablet (25 mg total) by mouth 3 (three) times daily as needed for anxiety. Patient not taking: Reported on 12/08/2018 09/20/16   Armandina Stammer I, NP  ibuprofen (ADVIL,MOTRIN) 600 MG tablet Take 1 tablet (600 mg total) by mouth every 6 (six) hours as needed. Patient not taking: Reported on 12/08/2018 08/11/18   Loren Racer, MD  levETIRAcetam (KEPPRA) 500 MG tablet Take 1 tablet (500 mg total) by mouth 2 (two) times daily. For seizure activities Patient not taking: Reported on 12/08/2018 06/06/17   Jene Every, MD  methocarbamol (ROBAXIN) 500 MG tablet Take 2 tablets (1,000 mg total) by mouth every 8 (eight) hours as needed for muscle spasms. Patient not taking: Reported on 12/08/2018 08/11/18   Loren Racer, MD  nicotine polacrilex (NICORETTE) 2 MG gum Take 1 each (2 mg total) by mouth as needed for smoking cessation. Patient not taking: Reported on 12/08/2018 09/20/16   Armandina Stammer I, NP  ondansetron (ZOFRAN) 4 MG  tablet Take 1 tablet (4 mg total) by mouth every 6 (six) hours. Patient not taking: Reported on 12/08/2018 05/25/18   Hedges, Tinnie GensJeffrey, PA-C  sucralfate (CARAFATE) 1 g tablet Take 1 tablet (1 g total) by mouth 4 (four) times daily -  with meals and at bedtime. Patient not taking: Reported on 12/09/2018 12/08/18   Rolan BuccoBelfi, Melanie, MD  traZODone (DESYREL) 50 MG tablet Take 1 tablet (50 mg) at bedtime: For insomnia Patient not taking: Reported on 12/08/2018 09/20/16   Sanjuana KavaNwoko, Agnes I, NP    Family History Family History    Problem Relation Age of Onset  . Seizures Father   . Seizures Brother     Social History Social History   Tobacco Use  . Smoking status: Current Every Day Smoker    Packs/day: 1.00    Years: 10.00    Pack years: 10.00    Types: Cigarettes  . Smokeless tobacco: Never Used  Substance Use Topics  . Alcohol use: Not Currently    Comment: rare use, but binges occasionally  . Drug use: Yes    Types: Marijuana    Comment: every other day     Allergies   Dexamethasone and Prednisone   Review of Systems Review of Systems   Physical Exam Updated Vital Signs BP 108/66   Pulse 69   Temp 98.4 F (36.9 C) (Oral)   Resp 14   SpO2 99%   Physical Exam Vitals signs and nursing note reviewed.  Constitutional:      Appearance: He is well-developed.  HENT:     Head: Normocephalic and atraumatic.     Right Ear: External ear normal.     Left Ear: External ear normal.  Eyes:     Conjunctiva/sclera: Conjunctivae normal.     Pupils: Pupils are equal, round, and reactive to light.  Neck:     Musculoskeletal: Normal range of motion and neck supple.     Trachea: Phonation normal.  Cardiovascular:     Rate and Rhythm: Normal rate and regular rhythm.     Heart sounds: Normal heart sounds.  Pulmonary:     Effort: Pulmonary effort is normal.     Breath sounds: Normal breath sounds.  Abdominal:     Palpations: Abdomen is soft.     Tenderness: There is no abdominal tenderness.  Musculoskeletal: Normal range of motion.     Comments: Normal strenght arms and legs. Bilaterally.  Skin:    General: Skin is warm and dry.  Neurological:     Mental Status: He is alert and oriented to person, place, and time.     Cranial Nerves: No cranial nerve deficit.     Sensory: No sensory deficit.     Motor: No abnormal muscle tone.     Coordination: Coordination normal.     Comments: No dysarthria , aphasia or nystagmus  Psychiatric:        Behavior: Behavior normal.        Thought Content:  Thought content normal.        Judgment: Judgment normal.      ED Treatments / Results  Labs (all labs ordered are listed, but only abnormal results are displayed) Labs Reviewed  COMPREHENSIVE METABOLIC PANEL - Abnormal; Notable for the following components:      Result Value   CO2 19 (*)    Glucose, Bld 109 (*)    BUN 21 (*)    Calcium 8.6 (*)    Total Bilirubin 2.0 (*)  All other components within normal limits  ETHANOL  CBC WITH DIFFERENTIAL/PLATELET  RAPID URINE DRUG SCREEN, HOSP PERFORMED  URINALYSIS, ROUTINE W REFLEX MICROSCOPIC  CBC WITH DIFFERENTIAL/PLATELET    EKG None  Radiology Ct Abdomen Pelvis W Contrast  Result Date: 12/08/2018 CLINICAL DATA:  23 year old male with history of acute generalized abdominal pain. EXAM: CT ABDOMEN AND PELVIS WITH CONTRAST TECHNIQUE: Multidetector CT imaging of the abdomen and pelvis was performed using the standard protocol following bolus administration of intravenous contrast. CONTRAST:  100mL ISOVUE-300 IOPAMIDOL (ISOVUE-300) INJECTION 61% COMPARISON:  CT the abdomen and pelvis 05/25/2018. FINDINGS: Lower chest: Unremarkable. Hepatobiliary: No suspicious cystic or solid hepatic lesions. No intra or extrahepatic biliary ductal dilatation. Gallbladder is normal in appearance. Pancreas: No pancreatic mass. No pancreatic ductal dilatation. No pancreatic or peripancreatic fluid or inflammatory changes. Spleen: Unremarkable. Adrenals/Urinary Tract: Bilateral kidneys and bilateral adrenal glands are normal in appearance. No hydroureteronephrosis. Urinary bladder is normal in appearance. Stomach/Bowel: Normal appearance of the stomach. No pathologic dilatation of small bowel or colon. Normal appendix. Vascular/Lymphatic: No atherosclerotic calcifications are noted in the abdominal aorta or pelvic vasculature. No lymphadenopathy noted in the abdomen or pelvis. Reproductive: Prostate gland and seminal vesicles are unremarkable in appearance. Other:  No significant volume of ascites.  No pneumoperitoneum. Musculoskeletal: There are no aggressive appearing lytic or blastic lesions noted in the visualized portions of the skeleton. IMPRESSION: 1. No acute findings are noted in the abdomen or pelvis to account for the patient's symptoms. 2. Normal appendix. Electronically Signed   By: Trudie Reedaniel  Entrikin M.D.   On: 12/08/2018 09:31    Procedures Procedures (including critical care time)  Medications Ordered in ED Medications  sodium chloride 0.9 % bolus 500 mL (0 mLs Intravenous Stopped 12/09/18 1201)  ketorolac (TORADOL) 30 MG/ML injection 15 mg (15 mg Intravenous Given 12/09/18 1059)  ondansetron (ZOFRAN) injection 4 mg (4 mg Intravenous Given 12/09/18 1202)  LORazepam (ATIVAN) injection 1 mg (1 mg Intravenous Given 12/09/18 1228)  LORazepam (ATIVAN) tablet 1 mg (1 mg Oral Given 12/09/18 1411)     Initial Impression / Assessment and Plan / ED Course  I have reviewed the triage vital signs and the nursing notes.  Pertinent labs & imaging results that were available during my care of the patient were reviewed by me and considered in my medical decision making (see chart for details).  Clinical Course as of Dec 21 1349  Wed Dec 09, 2018  1215 I was called to the room because the patient was having seizure-like activity.  Nurse had been in the room, and he laid down on the bed, kicked his feet up then began having upper extremity shaking with stiffening of his lower legs.  This apparently went on for about 1 minute.  After that he stop shaking and was responsive but not communicative.  I entered the room about 3 minutes after this happened, and the patient was resting comfortably.  While I was talking to the nurse about the situation, he began trembling, then his arms began shaking, flexing at the elbows and wrists, and his legs stiffened up, causing his feet to lift off the gurney.  This activity went on for about 30 seconds.  The activity then  stopped, and he was responsive, indicating that he was having trouble breathing, nodding when I asked him if that was so, and pointing to his upper abdomen, indicating pain.  He also nodded that he was having pain in the upper abdomen when I asked  him.  He then stated why is this happening?   [EW]  Mon Dec 21, 2018  1350 Normal except, CO2, Glucose, BUN, Calcium  Comprehensive metabolic panel(!) [EW]    Clinical Course User Index [EW] Mancel Bale, MD     Patient Vitals for the past 24 hrs:  BP Temp Temp src Pulse Resp SpO2  12/09/18 1412 - 98.4 F (36.9 C) Oral - - -  12/09/18 1410 - - - 69 14 99 %  12/09/18 1400 108/66 - - - - -  12/09/18 1330 118/67 - - - - -  12/09/18 1300 (!) 110/58 - - - (!) 22 -  12/09/18 1230 103/68 - - - 11 -  12/09/18 1205 (!) 147/111 - - 99 - 100 %  12/09/18 1130 (!) 123/112 - - 84 19 98 %  12/09/18 1030 107/63 - - - 18 -  12/09/18 0951 (!) 109/51 98.6 F (37 C) Oral 67 - 98 %  12/09/18 0947 - - - - - 96 %    1:19 PM Reevaluation with update and discussion. After initial assessment and treatment, an updated evaluation reveals at this time he is awake, alert and states "I feel a lot better after the Ativan."  At this time he requests referral to neurology to be evaluated for "something wrong in my brain."  He states that "I do not have epilepsy."  We also discussed his abdominal pain which is currently improved and now felt only on the right side.  All questions were answered. Mancel Bale   Medical Decision Making: Nonspecific abdominal pain, with nonspecific shaking episodes.  Doubt acute intra-abdominal process, requiring further intervention or treatment.  Nonspecific shaking likely related to anxiety is a stress reaction.  No indication for hospitalization at this time.  Plan referral to neurology and GI as outpatient.  CRITICAL CARE-no Performed by: Mancel Bale \ Nursing Notes Reviewed/ Care Coordinated Applicable Imaging  Reviewed Interpretation of Laboratory Data incorporated into ED treatment  The patient appears reasonably screened and/or stabilized for discharge and I doubt any other medical condition or other [facility] requiring further screening, evaluation, or treatment in the ED at this time prior to discharge.  Plan: Home Medications-OTC as needed; Home Treatments-rest, fluids; return here if the recommended treatment, does not improve the symptoms; Recommended follow up-neurology and GI follow-up soon as possible.   Final Clinical Impressions(s) / ED Diagnoses   Final diagnoses:  Abdominal pain, unspecified abdominal location  Episode of shaking    ED Discharge Orders    None       Mancel Bale, MD 12/09/18 1526    Mancel Bale, MD 12/21/18 1351

## 2018-12-09 NOTE — ED Notes (Signed)
Pt crying, reports abdominal pain, will notify Dr Effie ShyWentz.

## 2018-12-13 ENCOUNTER — Emergency Department (HOSPITAL_COMMUNITY): Payer: Self-pay

## 2018-12-13 ENCOUNTER — Emergency Department (HOSPITAL_COMMUNITY)
Admission: EM | Admit: 2018-12-13 | Discharge: 2018-12-13 | Disposition: A | Discharge status: Home / Self Care | Payer: Self-pay | Attending: Emergency Medicine | Admitting: Emergency Medicine

## 2018-12-13 ENCOUNTER — Other Ambulatory Visit: Payer: Self-pay

## 2018-12-13 ENCOUNTER — Encounter (HOSPITAL_COMMUNITY): Payer: Self-pay

## 2018-12-13 DIAGNOSIS — G40909 Epilepsy, unspecified, not intractable, without status epilepticus: Secondary | ICD-10-CM | POA: Insufficient documentation

## 2018-12-13 DIAGNOSIS — R109 Unspecified abdominal pain: Secondary | ICD-10-CM | POA: Insufficient documentation

## 2018-12-13 DIAGNOSIS — F12188 Cannabis abuse with other cannabis-induced disorder: Secondary | ICD-10-CM

## 2018-12-13 DIAGNOSIS — Z79899 Other long term (current) drug therapy: Secondary | ICD-10-CM | POA: Insufficient documentation

## 2018-12-13 DIAGNOSIS — F17210 Nicotine dependence, cigarettes, uncomplicated: Secondary | ICD-10-CM | POA: Insufficient documentation

## 2018-12-13 DIAGNOSIS — F1210 Cannabis abuse, uncomplicated: Secondary | ICD-10-CM | POA: Insufficient documentation

## 2018-12-13 DIAGNOSIS — R569 Unspecified convulsions: Secondary | ICD-10-CM

## 2018-12-13 LAB — CBC WITH DIFFERENTIAL/PLATELET
Abs Immature Granulocytes: 0.14 10*3/uL — ABNORMAL HIGH (ref 0.00–0.07)
Basophils Absolute: 0.1 10*3/uL (ref 0.0–0.1)
Basophils Relative: 0 %
EOS ABS: 0.1 10*3/uL (ref 0.0–0.5)
Eosinophils Relative: 0 %
HCT: 45.9 % (ref 39.0–52.0)
Hemoglobin: 15.5 g/dL (ref 13.0–17.0)
Immature Granulocytes: 1 %
Lymphocytes Relative: 7 %
Lymphs Abs: 1.7 10*3/uL (ref 0.7–4.0)
MCH: 30.3 pg (ref 26.0–34.0)
MCHC: 33.8 g/dL (ref 30.0–36.0)
MCV: 89.6 fL (ref 80.0–100.0)
Monocytes Absolute: 0.9 10*3/uL (ref 0.1–1.0)
Monocytes Relative: 4 %
Neutro Abs: 19.5 10*3/uL — ABNORMAL HIGH (ref 1.7–7.7)
Neutrophils Relative %: 88 %
Platelets: 302 10*3/uL (ref 150–400)
RBC: 5.12 MIL/uL (ref 4.22–5.81)
RDW: 12.8 % (ref 11.5–15.5)
WBC: 22.3 10*3/uL — ABNORMAL HIGH (ref 4.0–10.5)
nRBC: 0 % (ref 0.0–0.2)

## 2018-12-13 LAB — COMPREHENSIVE METABOLIC PANEL
ALT: 12 U/L (ref 0–44)
ANION GAP: 16 — AB (ref 5–15)
AST: 25 U/L (ref 15–41)
Albumin: 4.4 g/dL (ref 3.5–5.0)
Alkaline Phosphatase: 60 U/L (ref 38–126)
BUN: 15 mg/dL (ref 6–20)
CO2: 17 mmol/L — ABNORMAL LOW (ref 22–32)
Calcium: 9.7 mg/dL (ref 8.9–10.3)
Chloride: 104 mmol/L (ref 98–111)
Creatinine, Ser: 0.91 mg/dL (ref 0.61–1.24)
GFR calc Af Amer: >60 mL/min (ref >60)
GFR calc non Af Amer: >60 mL/min (ref >60)
Glucose, Bld: 128 mg/dL — ABNORMAL HIGH (ref 70–99)
POTASSIUM: 3.5 mmol/L (ref 3.5–5.1)
Sodium: 137 mmol/L (ref 135–145)
Total Bilirubin: 0.5 mg/dL (ref 0.3–1.2)
Total Protein: 6.9 g/dL (ref 6.5–8.1)

## 2018-12-13 LAB — URINALYSIS, ROUTINE W REFLEX MICROSCOPIC
Bilirubin Urine: NEGATIVE
Glucose, UA: NEGATIVE mg/dL
Hgb urine dipstick: NEGATIVE
Ketones, ur: NEGATIVE mg/dL
Leukocytes,Ua: NEGATIVE
NITRITE: NEGATIVE
Protein, ur: NEGATIVE mg/dL
Specific Gravity, Urine: 1.017 (ref 1.005–1.030)
pH: 7 (ref 5.0–8.0)

## 2018-12-13 LAB — RAPID URINE DRUG SCREEN, HOSP PERFORMED
Amphetamines: NOT DETECTED
Barbiturates: NOT DETECTED
Benzodiazepines: NOT DETECTED
Cocaine: NOT DETECTED
OPIATES: NOT DETECTED
TETRAHYDROCANNABINOL: POSITIVE — AB

## 2018-12-13 LAB — CBG MONITORING, ED: Glucose-Capillary: 115 mg/dL — ABNORMAL HIGH (ref 70–99)

## 2018-12-13 LAB — MAGNESIUM: Magnesium: 1.8 mg/dL (ref 1.7–2.4)

## 2018-12-13 LAB — LIPASE, BLOOD: Lipase: 35 U/L (ref 11–51)

## 2018-12-13 MED ORDER — PROMETHAZINE HCL 25 MG PO TABS: 25.0000 mg | ORAL_TABLET | Freq: Four times a day (QID) | ORAL | 0 refills | Status: DC | PRN

## 2018-12-13 MED ORDER — SODIUM CHLORIDE 0.9 % IV BOLUS
1000.0000 mL | Freq: Once | INTRAVENOUS | Status: AC
  Administered 2018-12-13: 1000 mL via INTRAVENOUS

## 2018-12-13 MED ORDER — HYOSCYAMINE SULFATE 0.125 MG PO TABS: 0.2500 mg | ORAL_TABLET | Freq: Once | ORAL | Status: DC

## 2018-12-13 MED ORDER — IOHEXOL 300 MG/ML  SOLN
100.0000 mL | Freq: Once | INTRAMUSCULAR | Status: AC | PRN
  Administered 2018-12-13: 100 mL via INTRAVENOUS

## 2018-12-13 MED ORDER — LEVETIRACETAM 500 MG PO TABS: 500.0000 mg | ORAL_TABLET | Freq: Two times a day (BID) | ORAL | 0 refills | Status: DC

## 2018-12-13 MED ORDER — LEVETIRACETAM IN NACL 1000 MG/100ML IV SOLN
1000.0000 mg | Freq: Once | INTRAVENOUS | Status: AC
  Administered 2018-12-13: 1000 mg via INTRAVENOUS
  Filled 2018-12-13: qty 100

## 2018-12-13 MED ORDER — LORAZEPAM 2 MG/ML IJ SOLN
0.5000 mg | Freq: Once | INTRAMUSCULAR | Status: AC
  Administered 2018-12-13: 0.5 mg via INTRAVENOUS
  Filled 2018-12-13: qty 1

## 2018-12-13 MED ORDER — FENTANYL CITRATE (PF) 100 MCG/2ML IJ SOLN
50.0000 ug | Freq: Once | INTRAMUSCULAR | Status: AC
  Administered 2018-12-13: 50 ug via INTRAVENOUS
  Filled 2018-12-13: qty 2

## 2018-12-13 MED ORDER — HYOSCYAMINE SULFATE 0.125 MG SL SUBL
0.2500 mg | SUBLINGUAL_TABLET | Freq: Once | SUBLINGUAL | Status: AC
  Administered 2018-12-13: 0.25 mg via SUBLINGUAL
  Filled 2018-12-13: qty 2

## 2018-12-13 MED ORDER — HALOPERIDOL LACTATE 5 MG/ML IJ SOLN
2.0000 mg | Freq: Once | INTRAMUSCULAR | Status: AC
  Administered 2018-12-13: 2 mg via INTRAVENOUS
  Filled 2018-12-13: qty 1

## 2018-12-13 NOTE — Discharge Instructions (Addendum)
If you are unable to swallow the phenergan the tablet may be placed in your rectum and will absorb through your rectal wall.  Please take your seizure medication and stop using marijuana.  You were given narcotic and or sedative medications while in the emergency department. Do not drive. Do not use machinery or power tools. Do not sign legal documents. Do not drink alcohol. Do not take sleeping pills. Do not supervise children by yourself. Do not participate in activities that require climbing or being in high places. Department of Motor Vehicle [facility]) of [place] regulations for seizures - It is the patients responsibility to report the incidence of the seizure in the state of [place]. [place] has no statutory provision requiring physicians to report patients diagnosed with epilepsy or seizures to a central state agency.  The recommended DMV regulation requirement for a driver in [place] for an individual with a seizure is that they be seizure-free for 6-12 months. However, the DMV may consider the following exceptions to this general rule where: (1) a physician-directed change in medication causes a seizure and the individual immediately resumes the previous therapy which controlled seizures; (2) there is a history of nocturnal seizures or seizures which do not involve loss of consciousness, loss of control of motor function, or loss of appropriate sensation and information process; and (3) an individual has a seizure disorder preceded by an aura (warning) lasting 2-3 minutes. While the [facility] may also give consideration to other unusual circumstances which may affect the general requirement that drivers be seizure-free for 6-12 months, interpretation of these circumstances and assignment of restrictions is at the discretion of the Medical Advisor. The DMV also considers compliance with medical therapy essential for safe driving. [facility] [place] Physician's Guide to [employer] (June, 1995  ed.)] The Department learns of an individual's condition by inquiring on the application form or renewal form, a physician's report to the [facility], an accident report or from correspondence from the individual. The person may be required to submit a Medical Report Form either annually or semi-annually.

## 2018-12-13 NOTE — ED Notes (Signed)
Attempted to give PO fluids and pt said he did not want anything to drink and went back to sleep.

## 2018-12-13 NOTE — ED Provider Notes (Signed)
[facility] Note   CSN: [id number] Arrival date & time: 12/13/18  0913     History   Chief Complaint Chief Complaint  Patient presents with  . Seizures    HPI Jaime Nixon is a 23 y.o. male.  The history is provided by the patient and medical records.  Seizures  Seizure type:  Grand mal Preceding symptoms: no sensation of an aura present, no dizziness, no euphoria, no headache, no hyperventilation, no nausea, no numbness, no panic and no vision change   Episode characteristics comment:  Unknown Return to baseline: yes   Severity:  Mild Duration: unknown. Context: drug use (Occasional marijuana)   Context: not alcohol withdrawal, not fever, not possible hypoglycemia and not possible medication ingestion   Context comment:  Not taking medication Recent head injury:  No recent head injuries Headache  Pain location:  Frontal Quality: aching. Radiates to:  Does not radiate Severity currently:  6/10 Timing:  Constant Progression:  Unchanged Chronicity:  Recurrent (occurs after seizures ) Context comment:  Seizure Relieved by:  None tried Associated symptoms: abdominal pain, nausea, seizures and vomiting   Associated symptoms: no diarrhea, no eye pain, no fever and no photophobia   Abdominal pain:    Location:  LUQ   Quality: stabbing     Severity:  Severe   Onset quality:  Sudden   Duration: onset after seizure this morning.   Timing:  Constant   Progression:  Unchanged   Chronicity:  New Vomiting:    Quality:  Stomach contents   Number of occurrences:  2 Abdominal Pain  Associated symptoms: nausea and vomiting   Associated symptoms: no chills, no diarrhea, no fever and no shortness of breath    23 year old male with a past medical history of substance abuse, seizure disorder who presents the emergency department with chief complaint of seizure.  The patient is unable to afford his Keppra and had an apparent  seizure this morning.  I am unable to gather specific information about the seizure which was apparently witnessed by his girlfriend.  EMS reports that by the time they got there he was awake, alert and oriented without post ictal findings.  Patient states that after his seizure he had onset of headache which is normal for him and onset of severe left upper quadrant abdominal pain, severe nausea and has been vomiting.  He states that the abdominal pain is abnormal for him and he was not having symptoms prior to the seizure.  Patient has a history of previous cannabinoid hyperemesis syndrome.  Past Medical History:  Diagnosis Date  . Current smoker   . History of substance use   . Seizures [facility](HCC)     Patient Active Problem List   Diagnosis Date Noted  . Cannabis abuse   . 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 Panic attacks 09/15/2016  . Bradycardia   . OD (overdose of drug), intentional self-harm, initial encounter (HCC) 09/10/2016  . Bradycardia, drug induced 09/10/2016  . Hypotension due to drugs 09/10/2016  . Overdose 09/10/2016  . Generalized abdominal pain   . Hyperkalemia   . Uncontrollable vomiting   . Seizure disorder (HCC) 05/26/2016  . Cannabinoid hyperemesis syndrome (HCC) 05/26/2016  . Hypokalemia due to loss of potassium 05/26/2016  . Tobacco dependence 05/26/2016  . Nausea and vomiting 05/26/2016  . Leukocytosis 12/27/2015  . Abnormal finding on MRI of brain   . Pachymeningitis  History reviewed. No pertinent surgical history.      Home Medications    Prior to Admission medications   Medication Sig Start Date End Date Taking? Authorizing Provider  acetaminophen (TYLENOL) 500 MG tablet Take 500 mg by mouth every 6 (six) hours as needed for mild pain.    [provider]  FLUoxetine (PROZAC) 20 MG capsule Take 1 capsule (20 mg total) by mouth daily. For depression Patient not taking: Reported  on 02/24/2017 09/21/16   Armandina Stammer I, NP  hydrOXYzine (ATARAX/VISTARIL) 25 MG tablet Take 1 tablet (25 mg total) by mouth 3 (three) times daily as needed for anxiety. Patient not taking: Reported on 12/08/2018 09/20/16   Armandina Stammer I, NP  ibuprofen (ADVIL,MOTRIN) 600 MG tablet Take 1 tablet (600 mg total) by mouth every 6 (six) hours as needed. Patient not taking: Reported on 12/08/2018 08/11/18   Loren Racer, MD  levETIRAcetam (KEPPRA) 500 MG tablet Take 1 tablet (500 mg total) by mouth 2 (two) times daily. For seizure activities Patient not taking: Reported on 12/08/2018 06/06/17   Jene Every, MD  methocarbamol (ROBAXIN) 500 MG tablet Take 2 tablets (1,000 mg total) by mouth every 8 (eight) hours as needed for muscle spasms. Patient not taking: Reported on 12/08/2018 08/11/18   Loren Racer, MD  nicotine polacrilex (NICORETTE) 2 MG gum Take 1 each (2 mg total) by mouth as needed for smoking cessation. Patient not taking: Reported on 12/08/2018 09/20/16   Armandina Stammer I, NP  ondansetron (ZOFRAN ODT) 4 MG disintegrating tablet Take 1 tablet (4 mg total) by mouth every 8 (eight) hours as needed for nausea or vomiting. 12/06/18   Roxy Horseman, PA-C  ondansetron (ZOFRAN) 4 MG tablet Take 1 tablet (4 mg total) by mouth every 6 (six) hours. Patient not taking: Reported on 12/08/2018 05/25/18   Hedges, Tinnie Gens, PA-C  ranitidine (ZANTAC) 150 MG capsule Take 1 capsule (150 mg total) by mouth daily. 05/25/18   Hedges, Tinnie Gens, PA-C  sucralfate (CARAFATE) 1 g tablet Take 1 tablet (1 g total) by mouth 4 (four) times daily -  with meals and at bedtime. Patient not taking: Reported on 12/09/2018 12/08/18   Rolan Bucco, MD  traZODone (DESYREL) 50 MG tablet Take 1 tablet (50 mg) at bedtime: For insomnia Patient not taking: Reported on 12/08/2018 09/20/16   Sanjuana Kava, NP    Family History Family History  Problem Relation Age of Onset  . Seizures Father   . Seizures Brother     Social  History Social History   Tobacco Use  . Smoking status: Current Every Day Smoker    Packs/day: 1.00    Years: 10.00    Pack years: 10.00    Types: Cigarettes  . Smokeless tobacco: Never Used  Substance Use Topics  . Alcohol use: Not Currently    Comment: rare use, but binges occasionally  . Drug use: Yes    Types: Marijuana    Comment: every other day     Allergies   Dexamethasone and Prednisone   Review of Systems Review of Systems  Constitutional: Negative for chills and fever.  HENT: Negative.   Eyes: Negative for photophobia, pain and visual disturbance.  Respiratory: Negative for shortness of breath and wheezing.   Cardiovascular: Negative.   Gastrointestinal: Positive for abdominal pain, nausea and vomiting. Negative for diarrhea.  Genitourinary: Negative.   Musculoskeletal: Negative.   Skin: Negative.   Neurological: Positive for seizures and headaches.  All other systems reviewed and are  negative.    Physical Exam Updated Vital Signs Pulse 98   Temp 97.9 F (36.6 C) (Oral)   Ht 6' (1.829 m)   Wt 90.7 kg   SpO2 99%   BMI 27.12 kg/m   Physical Exam Vitals signs and nursing note reviewed.  Constitutional:      General: He is not in acute distress.    Appearance: He is well-developed. He is not diaphoretic.  HENT:     Head: Normocephalic and atraumatic.  Eyes:     General: No scleral icterus.    Conjunctiva/sclera: Conjunctivae normal.     Pupils: Pupils are equal, round, and reactive to light.     Comments: No horizontal, vertical or rotational nystagmus  Neck:     Musculoskeletal: Normal range of motion and neck supple.     Comments: Full active and passive ROM without pain No midline or paraspinal tenderness No nuchal rigidity or meningeal signs Cardiovascular:     Rate and Rhythm: Normal rate and regular rhythm.  Pulmonary:     Effort: Pulmonary effort is normal. No respiratory distress.     Breath sounds: Normal breath sounds. No  wheezing or rales.  Abdominal:     General: Bowel sounds are normal.     Palpations: Abdomen is soft.     Tenderness: There is abdominal tenderness. There is no guarding or rebound.     Comments: Retching   Musculoskeletal: Normal range of motion.  Lymphadenopathy:     Cervical: No cervical adenopathy.  Skin:    General: Skin is warm and dry.     Findings: No rash.  Neurological:     Mental Status: He is alert and oriented to person, place, and time.     Cranial Nerves: No cranial nerve deficit.     Motor: No abnormal muscle tone.     Coordination: Coordination normal.     Comments: Mental Status:  Alert, oriented, thought content appropriate. Speech fluent without evidence of aphasia. Able to follow 2 step commands without difficulty.  Cranial Nerves:  II:  Peripheral visual fields grossly normal, pupils equal, round, reactive to light III,IV, VI: ptosis not present, extra-ocular motions intact bilaterally  V,VII: smile symmetric, facial light touch sensation equal VIII: hearing grossly normal bilaterally  IX,X: midline uvula rise  XI: bilateral shoulder shrug equal and strong XII: midline tongue extension  Motor:  5/5 in upper and lower extremities bilaterally including strong and equal grip strength and dorsiflexion/plantar flexion Sensory: Pinprick and light touch normal in all extremities.  Cerebellar: normal finger-to-nose with bilateral upper extremities Gait: normal gait and balance CV: distal pulses palpable throughout   Psychiatric:        Behavior: Behavior normal.        Thought Content: Thought content normal.        Judgment: Judgment normal.      ED Treatments / Results  Labs (all labs ordered are listed, but only abnormal results are displayed) Labs Reviewed  CBC WITH DIFFERENTIAL/PLATELET  COMPREHENSIVE METABOLIC PANEL  MAGNESIUM  URINALYSIS, ROUTINE W REFLEX MICROSCOPIC  RAPID URINE DRUG SCREEN, HOSP PERFORMED  LIPASE, BLOOD  CBG MONITORING, ED     EKG None  Radiology No results found.  Procedures Procedures (including critical care time)  Medications Ordered in ED Medications  sodium chloride 0.9 % bolus 1,000 mL (has no administration in time range)  haloperidol lactate (HALDOL) injection 2 mg (has no administration in time range)     Initial Impression / Assessment and  Plan / ED Course  I have reviewed the triage vital signs and the nursing notes.  Pertinent labs & imaging results that were available during my care of the patient were reviewed by me and considered in my medical decision making (see chart for details).  Clinical Course as of Dec 15 1900  Sun Dec 13, 2018  1103 WBC(!): 22.3 [AH]    Clinical Course User Index [AH] Arthor CaptainHarris, Kinze Labo, PA-C   Patient with hx of marijuana abuse, recurretn visits for abdominal pain, vomiting, likely due to Cannabis hyperemesis. The emergent differential diagnosis for vomiting includes, but is not limited to ACS/MI, Boerhaave's, DKA, Intracranial Hemorrhage, Ischemic bowel, Meningitis, Sepsis, Acute radiation syndrome, Acute gastric dilation, Acetaminophen toxicity, Adrenal insufficiency, Appendicitis, Aspirin toxicity, Bowel obstruction/ileus, Carbon monoxide poisoning, Cholecystitis, CNS tumor. Digoxin toxicity, Electrolyte abnormalities, Elevated ICP, Gastric outlet obstruction, Hyperemesis gravidarum, Pancreatitis, Peritonitis, Ruptured viscus, Testicular torsion/ovarian torsion, Theophyline toxicity, Biliary colic, Cannabis hyperemesis syndrome, Chemotherapy, Disulfiram effect, Erythromycin, ETOH, Gastritis, Gastroenteritis, Gastroparesis, Hepatitis,Labyrinthitis, Migraine, Motion sickness, Narcotic withdrawal, Thyroid, Pregnancy, Peptic ulcer disease, Renal colic, and UTI. Patient has had a previous seizure prior to arrival.  He has had no repeat seizures here.  Patient is noncompliant with his medications.  He states that he is unable to afford Keppra.  Patient's white blood  cell count was not significantly elevated however it has been elevated at previous ER visits up to 24,000 and is lower than his last 2 visits.  The patient has a negative CT abdomen and pelvis.  Patient's bicarb is decreased likely in the setting of vomiting.  His blood glucose is also slightly elevated and I attribute this to acute phase reaction.  The patient is not febrile, tachycardic or hypotensive.  No evidence of sepsis.  Patient's urinalysis is negative for infection.  He has a normal lipase.  UDS is positive for marijuana.  Patient was given multiple antiemetics here in the emergency department and a Keppra loading dose.  His abdominal pain was decreased but not fully controlled however the patient was able to tolerate p.o. fluids in the emergency department.  Patient was encouraged to discontinue using marijuana as this may be contributing to his bouts of abdominal pain and vomiting and also encouraged purchase and use his antiseizure medications.  Patient appears otherwise appropriate for discharge at this time.  I discussed the case with Dr. Rush Landmarkegeler.  Patient will be discharged.  Discussed outpatient follow-up and return precautions.  Final Clinical Impressions(s) / ED Diagnoses   Final diagnoses:  None    ED Discharge Orders    None       Arthor CaptainHarris, Macaulay Reicher, PA-C 12/15/18 1910    Tegeler, Canary Brimhristopher J, MD 12/15/18 1940

## 2018-12-13 NOTE — ED Triage Notes (Signed)
Seizure lasted a few minutes this am (per pt). Pt came out of the seizure having anxiety & feeling confused. Also c/o n/v abd pain for about a week

## 2019-11-02 ENCOUNTER — Ambulatory Visit: Payer: MEDICAID | Attending: Internal Medicine

## 2019-11-02 DIAGNOSIS — Z20822 Contact with and (suspected) exposure to covid-19: Secondary | ICD-10-CM

## 2019-11-04 LAB — NOVEL CORONAVIRUS, NAA: SARS-CoV-2, NAA: NOT DETECTED

## 2019-12-01 ENCOUNTER — Ambulatory Visit: Payer: MEDICAID | Attending: Internal Medicine

## 2019-12-01 DIAGNOSIS — Z20822 Contact with and (suspected) exposure to covid-19: Secondary | ICD-10-CM

## 2019-12-02 LAB — NOVEL CORONAVIRUS, NAA: SARS-CoV-2, NAA: NOT DETECTED

## 2020-02-07 ENCOUNTER — Ambulatory Visit (HOSPITAL_COMMUNITY)
Admission: EM | Admit: 2020-02-07 | Discharge: 2020-02-07 | Disposition: A | Discharge status: Home / Self Care | Payer: Self-pay | Attending: Family Medicine | Admitting: Family Medicine

## 2020-02-07 ENCOUNTER — Other Ambulatory Visit: Payer: Self-pay

## 2020-02-07 ENCOUNTER — Encounter (HOSPITAL_COMMUNITY): Payer: Self-pay

## 2020-02-07 DIAGNOSIS — R112 Nausea with vomiting, unspecified: Secondary | ICD-10-CM

## 2020-02-07 DIAGNOSIS — K529 Noninfective gastroenteritis and colitis, unspecified: Secondary | ICD-10-CM

## 2020-02-07 DIAGNOSIS — F430 Acute stress reaction: Secondary | ICD-10-CM

## 2020-02-07 MED ORDER — SUCRALFATE 1 G PO TABS: 1.0000 g | ORAL_TABLET | Freq: Three times a day (TID) | ORAL | 0 refills | Status: DC

## 2020-02-07 MED ORDER — ONDANSETRON HCL 4 MG PO TABS: 4.0000 mg | ORAL_TABLET | Freq: Four times a day (QID) | ORAL | 0 refills | Status: DC

## 2020-02-07 MED ORDER — PANTOPRAZOLE SODIUM 20 MG PO TBEC: 20.0000 mg | DELAYED_RELEASE_TABLET | Freq: Every day | ORAL | 1 refills | Status: DC

## 2020-02-07 NOTE — ED Triage Notes (Signed)
Pt reports for the past 2 and a half weeks he vomits every time he eats.Pt reports his appetite decreased for the past 2 and a half weeks.

## 2020-02-07 NOTE — ED Provider Notes (Signed)
[place]    CSN: [id number] Arrival date & time: 02/07/20  1243      History   Chief Complaint Chief Complaint  Patient presents with  . Emesis    HPI Jaime Nixon is a 24 y.o. male.   Patient reports that for the last 3 weeks he has had ongoing nausea with intermittent vomiting, that is made worse by eating.  He has not noticed any food that makes this better or worse, just food in general.  Patient states that he is tolerating liquids and is able to drink plenty just fine.  Has made no attempt to treat this at home.  Has had stomach issues in the past, patient states that these were related to alcohol.  Patient states he does not drink alcohol at this time.  Patient also reports that he has recently, to a great stress in his life.  He reports that his father recently died, he has some issues going on with the court, and he is making a big move to [place] and June or July.  He is thinking of the stress could be impacting his symptoms.  Patient states that he smokes cigarettes and marijuana, he is also tried to smoke marijuana to help with the nausea and to stimulate his appetite.  Denies headache, shortness of breath, cough, chills, body aches, diarrhea, rash, fever, other symptoms.  ROS per HPI  The history is provided by the patient.    Past Medical History:  Diagnosis Date  . Current smoker   . History of substance use   . Seizures [facility])     Patient Active Problem List   Diagnosis Date Noted  . Cannabis abuse   . Severe episode of recurrent major depressive disorder, with psychotic features ([place]) 09/18/2016  . Panic attacks 09/15/2016  . Bradycardia   . OD (overdose of drug), intentional self-harm, initial encounter ([place]) 09/10/2016  . Bradycardia, drug induced 09/10/2016  . Hypotension due to drugs 09/10/2016  . Overdose 09/10/2016  . Generalized abdominal pain   . Hyperkalemia   . Uncontrollable vomiting   . Seizure disorder ([place]) 05/26/2016  .  Cannabinoid hyperemesis syndrome 05/26/2016  . Hypokalemia due to loss of potassium 05/26/2016  . Tobacco dependence 05/26/2016  . Nausea and vomiting 05/26/2016  . Leukocytosis 12/27/2015  . Abnormal finding on MRI of brain   . Pachymeningitis     History reviewed. No pertinent surgical history.     Home Medications    Prior to Admission medications   Medication Sig Start Date End Date Taking? Authorizing Provider  nicotine polacrilex (NICORETTE) 2 MG gum Take 1 each (2 mg total) by mouth as needed for smoking cessation. Patient not taking: Reported on 12/08/2018 09/20/16   Lindell Spar I, NP  ondansetron (ZOFRAN) 4 MG tablet Take 1 tablet (4 mg total) by mouth every 6 (six) hours. 02/07/20   Faustino Congress, NP  pantoprazole (PROTONIX) 20 MG tablet Take 1 tablet (20 mg total) by mouth daily. 02/07/20   Faustino Congress, NP  promethazine (PHENERGAN) 25 MG tablet Take 1 tablet (25 mg total) by mouth every 6 (six) hours as needed for nausea or vomiting. 12/13/18   Margarita Mail, PA-C  ranitidine (ZANTAC) 150 MG capsule Take 1 capsule (150 mg total) by mouth daily. Patient not taking: Reported on 12/13/2018 05/25/18   Hedges, Dellis Filbert, PA-C  sucralfate (CARAFATE) 1 g tablet Take 1 tablet (1 g total) by mouth 4 (four) times daily -  with  meals and at bedtime. 02/07/20   Moshe Cipro, NP  traZODone (DESYREL) 50 MG tablet Take 1 tablet (50 mg) at bedtime: For insomnia Patient not taking: Reported on 12/08/2018 09/20/16   Armandina Stammer I, NP  FLUoxetine (PROZAC) 20 MG capsule Take 1 capsule (20 mg total) by mouth daily. For depression Patient not taking: Reported on 02/24/2017 09/21/16 02/07/20  Armandina Stammer I, NP  levETIRAcetam (KEPPRA) 500 MG tablet Take 1 tablet (500 mg total) by mouth 2 (two) times daily. 12/13/18 02/07/20  Arthor Captain, PA-C    Family History Family History  Problem Relation Age of Onset  . Seizures Father   . Seizures Brother     Social History Social  History   Tobacco Use  . Smoking status: Current Every Day Smoker    Packs/day: 1.00    Years: 10.00    Pack years: 10.00    Types: Cigarettes  . Smokeless tobacco: Never Used  Substance Use Topics  . Alcohol use: Not Currently    Comment: rare use, but binges occasionally  . Drug use: Yes    Types: Marijuana    Comment: every other day     Allergies   Dexamethasone and Prednisone   Review of Systems Review of Systems   Physical Exam Triage Vital Signs ED Triage Vitals  Enc Vitals Group     BP 02/07/20 1433 118/71     Pulse Rate 02/07/20 1433 74     Resp 02/07/20 1433 18     Temp 02/07/20 1433 98.1 F (36.7 C)     Temp Source 02/07/20 1433 Oral     SpO2 02/07/20 1433 100 %     Weight --      Height --      Head Circumference --      Peak Flow --      Pain Score 02/07/20 1432 0     Pain Loc --      Pain Edu? --      Excl. in GC? --    No data found.  Updated Vital Signs BP 118/71 (BP Location: Left Arm)   Pulse 74   Temp 98.1 F (36.7 C) (Oral)   Resp 18   SpO2 100%   Visual Acuity Right Eye Distance:   Left Eye Distance:   Bilateral Distance:    Right Eye Near:   Left Eye Near:    Bilateral Near:     Physical Exam Vitals and nursing note reviewed.  Constitutional:      General: He is not in acute distress.    Appearance: Normal appearance. He is well-developed and normal weight. He is not ill-appearing.  HENT:     Head: Normocephalic and atraumatic.     Nose: Nose normal.     Mouth/Throat:     Mouth: Mucous membranes are moist.     Pharynx: Oropharynx is clear.  Eyes:     Extraocular Movements: Extraocular movements intact.     Conjunctiva/sclera: Conjunctivae normal.     Pupils: Pupils are equal, round, and reactive to light.  Cardiovascular:     Rate and Rhythm: Normal rate and regular rhythm.     Heart sounds: No murmur.  Pulmonary:     Effort: Pulmonary effort is normal. No respiratory distress.     Breath sounds: Normal breath  sounds.  Abdominal:     General: Bowel sounds are normal. There is no distension.     Palpations: Abdomen is soft. There is no mass.  Tenderness: There is no abdominal tenderness. There is no right CVA tenderness, left CVA tenderness, guarding or rebound.     Hernia: No hernia is present.  Musculoskeletal:        General: Normal range of motion.     Cervical back: Normal range of motion and neck supple.  Skin:    General: Skin is warm and dry.     Capillary Refill: Capillary refill takes less than 2 seconds.  Neurological:     General: No focal deficit present.     Mental Status: He is alert and oriented to person, place, and time.  Psychiatric:        Mood and Affect: Mood normal.        Behavior: Behavior normal.        Thought Content: Thought content normal.      UC Treatments / Results  Labs (all labs ordered are listed, but only abnormal results are displayed) Labs Reviewed - No data to display  EKG   Radiology No results found.  Procedures Procedures (including critical care time)  Medications Ordered in UC Medications - No data to display  Initial Impression / Assessment and Plan / UC Course  I have reviewed the triage vital signs and the nursing notes.  Pertinent labs & imaging results that were available during my care of the patient were reviewed by me and considered in my medical decision making (see chart for details).     Nausea/vomiting: Has been experiencing nausea with vomiting over the last 3 weeks.  Could be stress reaction due to father's death, court issues, and big move coming up soon.  No daily medications.  Prescribed Zofran as needed for nausea, prescribed Protonix 40 mg in the morning daily, also prescribed Carafate with meals as needed.  Instructed patient that he needs to establish primary care and follow-up there, as he needs to get into GI.  Instructed patient that if he is experiencing trouble swallowing, trouble breathing, noticing any  blood in vomit or stool, that he is to report to the ER for further evaluation and treatment.  Patient verbalizes understanding and agrees to treatment plan.  Final Clinical Impressions(s) / UC Diagnoses   Final diagnoses:  Intractable vomiting with nausea, unspecified vomiting type  Stress reaction  Gastroenteritis     Discharge Instructions     I have sent in Zofran, protonix, carafate as well.   Take the protonix daily for the next 7 days, take the carafate as directed for the next week as well. Take the zofran for nausea as needed.   Follow up if not improving over the next week.    ED Prescriptions    Medication Sig Dispense Auth. Provider   pantoprazole (PROTONIX) 20 MG tablet Take 1 tablet (20 mg total) by mouth daily. 30 tablet Moshe Cipro, NP   sucralfate (CARAFATE) 1 g tablet Take 1 tablet (1 g total) by mouth 4 (four) times daily -  with meals and at bedtime. 30 tablet Moshe Cipro, NP   ondansetron (ZOFRAN) 4 MG tablet Take 1 tablet (4 mg total) by mouth every 6 (six) hours. 12 tablet Moshe Cipro, NP     PDMP not reviewed this encounter.   Moshe Cipro, NP 02/07/20 1524

## 2020-02-07 NOTE — Discharge Instructions (Addendum)
I have sent in Zofran, protonix, carafate as well.   Take the protonix daily for the next 7 days, take the carafate as directed for the next week as well. Take the zofran for nausea as needed.   Follow up if not improving over the next week.

## 2020-04-13 ENCOUNTER — Emergency Department (HOSPITAL_COMMUNITY)
Admission: EM | Admit: 2020-04-13 | Discharge: 2020-04-14 | Discharge status: Against Medical Advice | Payer: Medicaid Other | Attending: Emergency Medicine | Admitting: Emergency Medicine

## 2020-04-13 ENCOUNTER — Other Ambulatory Visit: Payer: Self-pay

## 2020-04-13 ENCOUNTER — Encounter (HOSPITAL_COMMUNITY): Payer: Self-pay | Admitting: Emergency Medicine

## 2020-04-13 DIAGNOSIS — Z5321 Procedure and treatment not carried out due to patient leaving prior to being seen by health care provider: Secondary | ICD-10-CM | POA: Insufficient documentation

## 2020-04-13 DIAGNOSIS — R112 Nausea with vomiting, unspecified: Secondary | ICD-10-CM | POA: Insufficient documentation

## 2020-04-13 DIAGNOSIS — R109 Unspecified abdominal pain: Secondary | ICD-10-CM | POA: Insufficient documentation

## 2020-04-13 LAB — CBC
HCT: 46.3 % (ref 39.0–52.0)
Hemoglobin: 16 g/dL (ref 13.0–17.0)
MCH: 30.4 pg (ref 26.0–34.0)
MCHC: 34.6 g/dL (ref 30.0–36.0)
MCV: 88 fL (ref 80.0–100.0)
Platelets: 243 10*3/uL (ref 150–400)
RBC: 5.26 MIL/uL (ref 4.22–5.81)
RDW: 13.4 % (ref 11.5–15.5)
WBC: 18.2 10*3/uL — ABNORMAL HIGH (ref 4.0–10.5)
nRBC: 0 % (ref 0.0–0.2)

## 2020-04-13 LAB — COMPREHENSIVE METABOLIC PANEL
ALT: 17 U/L (ref 0–44)
AST: 33 U/L (ref 15–41)
Albumin: 5.7 g/dL — ABNORMAL HIGH (ref 3.5–5.0)
Alkaline Phosphatase: 66 U/L (ref 38–126)
Anion gap: 12 (ref 5–15)
BUN: 19 mg/dL (ref 6–20)
CO2: 21 mmol/L — ABNORMAL LOW (ref 22–32)
Calcium: 10.1 mg/dL (ref 8.9–10.3)
Chloride: 107 mmol/L (ref 98–111)
Creatinine, Ser: 0.85 mg/dL (ref 0.61–1.24)
GFR calc Af Amer: >60 mL/min (ref >60)
GFR calc non Af Amer: >60 mL/min (ref >60)
Glucose, Bld: 181 mg/dL — ABNORMAL HIGH (ref 70–99)
Potassium: 4.2 mmol/L (ref 3.5–5.1)
Sodium: 140 mmol/L (ref 135–145)
Total Bilirubin: 0.7 mg/dL (ref 0.3–1.2)
Total Protein: 8.4 g/dL — ABNORMAL HIGH (ref 6.5–8.1)

## 2020-04-13 LAB — LIPASE, BLOOD: Lipase: 20 U/L (ref 11–51)

## 2020-04-13 MED ORDER — SODIUM CHLORIDE 0.9% FLUSH: 3.0000 mL | Freq: Once | INTRAVENOUS | Status: DC

## 2020-04-13 MED ORDER — ONDANSETRON HCL 4 MG/2ML IJ SOLN: 4.0000 mg | Freq: Once | INTRAMUSCULAR | Status: DC | PRN

## 2020-04-13 NOTE — ED Triage Notes (Signed)
Patient complaining of nausea, abdominal pain, and vomiting. Patient states that he has been throwing up started around 1 pm. PIV # 18 left AC. 2 doses of 4mg  of zofran.

## 2020-05-29 ENCOUNTER — Other Ambulatory Visit: Payer: Self-pay

## 2020-05-29 ENCOUNTER — Emergency Department (HOSPITAL_BASED_OUTPATIENT_CLINIC_OR_DEPARTMENT_OTHER)
Admission: EM | Admit: 2020-05-29 | Discharge: 2020-05-29 | Disposition: A | Discharge status: Home / Self Care | Payer: Medicaid Other | Attending: Emergency Medicine | Admitting: Emergency Medicine

## 2020-05-29 ENCOUNTER — Encounter (HOSPITAL_BASED_OUTPATIENT_CLINIC_OR_DEPARTMENT_OTHER): Payer: Self-pay | Admitting: Emergency Medicine

## 2020-05-29 DIAGNOSIS — R109 Unspecified abdominal pain: Secondary | ICD-10-CM | POA: Insufficient documentation

## 2020-05-29 DIAGNOSIS — R112 Nausea with vomiting, unspecified: Secondary | ICD-10-CM | POA: Insufficient documentation

## 2020-05-29 DIAGNOSIS — R197 Diarrhea, unspecified: Secondary | ICD-10-CM | POA: Insufficient documentation

## 2020-05-29 DIAGNOSIS — F17210 Nicotine dependence, cigarettes, uncomplicated: Secondary | ICD-10-CM | POA: Insufficient documentation

## 2020-05-29 LAB — COMPREHENSIVE METABOLIC PANEL
ALT: 14 U/L (ref 0–44)
AST: 32 U/L (ref 15–41)
Albumin: 4.8 g/dL (ref 3.5–5.0)
Alkaline Phosphatase: 64 U/L (ref 38–126)
Anion gap: 13 (ref 5–15)
BUN: 21 mg/dL — ABNORMAL HIGH (ref 6–20)
CO2: 21 mmol/L — ABNORMAL LOW (ref 22–32)
Calcium: 9.8 mg/dL (ref 8.9–10.3)
Chloride: 106 mmol/L (ref 98–111)
Creatinine, Ser: 0.84 mg/dL (ref 0.61–1.24)
GFR calc Af Amer: >60 mL/min (ref >60)
GFR calc non Af Amer: >60 mL/min (ref >60)
Glucose, Bld: 168 mg/dL — ABNORMAL HIGH (ref 70–99)
Potassium: 4.3 mmol/L (ref 3.5–5.1)
Sodium: 140 mmol/L (ref 135–145)
Total Bilirubin: 0.5 mg/dL (ref 0.3–1.2)
Total Protein: 8.1 g/dL (ref 6.5–8.1)

## 2020-05-29 LAB — URINALYSIS, ROUTINE W REFLEX MICROSCOPIC
Bilirubin Urine: NEGATIVE
Glucose, UA: NEGATIVE mg/dL
Hgb urine dipstick: NEGATIVE
Ketones, ur: NEGATIVE mg/dL
Leukocytes,Ua: NEGATIVE
Nitrite: NEGATIVE
Protein, ur: NEGATIVE mg/dL
Specific Gravity, Urine: 1.02 (ref 1.005–1.030)
pH: 7.5 (ref 5.0–8.0)

## 2020-05-29 LAB — CBC
HCT: 47.7 % (ref 39.0–52.0)
Hemoglobin: 16.3 g/dL (ref 13.0–17.0)
MCH: 30 pg (ref 26.0–34.0)
MCHC: 34.2 g/dL (ref 30.0–36.0)
MCV: 87.8 fL (ref 80.0–100.0)
Platelets: 259 10*3/uL (ref 150–400)
RBC: 5.43 MIL/uL (ref 4.22–5.81)
RDW: 13.3 % (ref 11.5–15.5)
WBC: 14.4 10*3/uL — ABNORMAL HIGH (ref 4.0–10.5)
nRBC: 0 % (ref 0.0–0.2)

## 2020-05-29 LAB — LIPASE, BLOOD: Lipase: 27 U/L (ref 11–51)

## 2020-05-29 MED ORDER — HALOPERIDOL LACTATE 5 MG/ML IJ SOLN
4.0000 mg | Freq: Once | INTRAMUSCULAR | Status: AC
  Administered 2020-05-29: 4 mg via INTRAVENOUS
  Filled 2020-05-29: qty 1

## 2020-05-29 MED ORDER — SODIUM CHLORIDE 0.9 % IV BOLUS
1000.0000 mL | Freq: Once | INTRAVENOUS | Status: AC
  Administered 2020-05-29: 1000 mL via INTRAVENOUS

## 2020-05-29 MED ORDER — ONDANSETRON 4 MG PO TBDP: 4.0000 mg | ORAL_TABLET | Freq: Once | ORAL | Status: AC | PRN

## 2020-05-29 MED ORDER — ONDANSETRON 4 MG PO TBDP: 4.0000 mg | ORAL_TABLET | Freq: Once | ORAL | Status: DC

## 2020-05-29 MED ORDER — ALUM & MAG HYDROXIDE-SIMETH 200-200-20 MG/5ML PO SUSP
30.0000 mL | Freq: Once | ORAL | Status: AC
  Administered 2020-05-29: 30 mL via ORAL
  Filled 2020-05-29: qty 30

## 2020-05-29 MED ORDER — ONDANSETRON 4 MG PO TBDP
4.0000 mg | ORAL_TABLET | Freq: Three times a day (TID) | ORAL | 0 refills | Status: DC | PRN
Start: 2020-05-29 — End: 2022-07-22

## 2020-05-29 MED ORDER — LIDOCAINE VISCOUS HCL 2 % MT SOLN
15.0000 mL | Freq: Once | OROMUCOSAL | Status: AC
  Administered 2020-05-29: 15 mL via ORAL
  Filled 2020-05-29: qty 15

## 2020-05-29 MED ORDER — ONDANSETRON 4 MG PO TBDP
ORAL_TABLET | ORAL | Status: AC
  Administered 2020-05-29: 4 mg via ORAL
  Filled 2020-05-29: qty 1

## 2020-05-29 NOTE — ED Notes (Signed)
IV attempted in left antecubital without success, was able to draw blood but infiltrated.

## 2020-05-29 NOTE — ED Notes (Signed)
Pt came out of room  Had pulled iv out  Holding a towel to iv site states he wanted to go home and go to bed  Stated nausea was getting better,  md notified and wrote dc instructions,  Pt walked to discharge by rn

## 2020-05-29 NOTE — ED Triage Notes (Signed)
Pt arrives via EMS from home with c/o nausea, vomiting, diarrhea since yesterday. Diarrhea has resolved however vomiting remains. Reports onset after eating breakfast food for lunch yesterday. EMS gave 4MG  zofran IM.

## 2020-05-29 NOTE — ED Triage Notes (Addendum)
Pt diaphoretic and having difficulty holding still in triage. Reports 10/10 abdominal pain. Reports recent travel to [place].

## 2020-05-29 NOTE — ED Provider Notes (Signed)
[facility] EMERGENCY DEPARTMENT Provider Note   CSN: [id number] Arrival date & time: 05/29/20  0636     History Chief Complaint  Patient presents with   Emesis    Jaime Nixon is a 24 y.o. male.  Reports numerous episodes of nonbloody non billious nausea and vomiting starting yesterday around 2 pm. States this started about 2 hours after lunch of eggs, bacon, and other breakfast foods. Nausea and vomiting associated with intermittent abdominal pain and 1 episode of non bloody diarrhea. States he has had similar episodes due to alcohol and mariajuana use, but is not currently drinking, last time he used mariajuana was 2 days ago.   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 No pertinent surgical history.     Family History  Problem Relation Age of Onset   Seizures Father    Seizures Brother     Social History   Tobacco Use   Smoking status: Current  Every Day Smoker    Packs/day: 1.00    Years: 10.00    Pack years: 10.00    Types: Cigarettes   Smokeless tobacco: Never Used  Substance Use Topics   Alcohol use: Not Currently    Comment: rare use, but binges occasionally   Drug use: Yes    Types: Marijuana    Comment: every other day    Home Medications Prior to Admission medications   Medication Sig Start Date End Date Taking? Authorizing Provider  nicotine polacrilex (NICORETTE) 2 MG gum Take 1 each (2 mg total) by mouth as needed for smoking cessation. Patient not taking: Reported on 12/08/2018 09/20/16   Armandina Stammer I, NP  ondansetron (ZOFRAN) 4 MG tablet Take 1 tablet (4 mg total) by mouth every 6 (six) hours. 02/07/20   Moshe Cipro, NP  pantoprazole (PROTONIX) 20 MG tablet Take 1 tablet (20 mg total) by mouth daily. 02/07/20   Moshe Cipro, NP  promethazine (PHENERGAN) 25 MG tablet Take 1 tablet (25 mg total) by mouth every 6 (six) hours as needed for nausea or vomiting. 12/13/18   Arthor Captain, PA-C  ranitidine (ZANTAC) 150 MG capsule Take 1 capsule (150 mg total) by mouth daily. Patient not taking: Reported on 12/13/2018 05/25/18   Hedges, Tinnie Gens, PA-C  sucralfate (CARAFATE) 1 g tablet Take 1 tablet (1 g total) by mouth 4 (four) times daily -  with meals and at bedtime. 02/07/20   Moshe Cipro,  NP  traZODone (DESYREL) 50 MG tablet Take 1 tablet (50 mg) at bedtime: For insomnia Patient not taking: Reported on 12/08/2018 09/20/16   Armandina Stammer I, NP  FLUoxetine (PROZAC) 20 MG capsule Take 1 capsule (20 mg total) by mouth daily. For depression Patient not taking: Reported on 02/24/2017 09/21/16 02/07/20  Armandina Stammer I, NP  levETIRAcetam (KEPPRA) 500 MG tablet Take 1 tablet (500 mg total) by mouth 2 (two) times daily. 12/13/18 02/07/20  Arthor Captain, PA-C    Allergies    Dexamethasone and Prednisone  Review of Systems   Review of Systems  Constitutional: Negative for activity change, chills and fever.   HENT: Negative for sore throat and trouble swallowing.   Respiratory: Negative for chest tightness and shortness of breath.   Cardiovascular: Negative for chest pain and palpitations.  Gastrointestinal: Positive for abdominal pain, diarrhea, nausea and vomiting. Negative for abdominal distention.  Endocrine: Negative for polydipsia, polyphagia and polyuria.  Genitourinary: Negative for dysuria, flank pain and frequency.  Musculoskeletal: Negative for arthralgias and back pain.  Neurological: Negative for seizures, weakness, light-headedness and headaches.  Hematological: Negative for adenopathy. Does not bruise/bleed easily.  Psychiatric/Behavioral: Negative for confusion, self-injury and suicidal ideas.    Physical Exam Updated Vital Signs BP (!) 127/88 (BP Location: Right Arm)    Pulse 84    Temp 97.9 F (36.6 C) (Axillary)    Resp 17    Ht 6' (1.829 m)    SpO2 97%    BMI 25.77 kg/m   Physical Exam Vitals reviewed.  Constitutional:      General: He is in acute distress.     Comments: Rocking back an forth in be due to pain.  HENT:     Head: Normocephalic and atraumatic.     Right Ear: Tympanic membrane normal.     Left Ear: Tympanic membrane normal.     Nose: Nose normal.     Mouth/Throat:     Mouth: Mucous membranes are dry.     Pharynx: Oropharynx is clear.  Eyes:     Extraocular Movements: Extraocular movements intact.     Pupils: Pupils are equal, round, and reactive to light.  Cardiovascular:     Rate and Rhythm: Normal rate and regular rhythm.     Pulses: Normal pulses.     Heart sounds: Normal heart sounds.  Pulmonary:     Effort: Pulmonary effort is normal.     Breath sounds: Normal breath sounds.  Abdominal:     General: Abdomen is flat. Bowel sounds are normal.     Palpations: Abdomen is soft. There is no mass.     Tenderness: There is abdominal tenderness. There is no right CVA tenderness, left CVA tenderness, guarding or rebound.  Musculoskeletal:         General: No swelling or deformity. Normal range of motion.     Cervical back: Normal range of motion and neck supple.  Skin:    General: Skin is warm and dry.     Capillary Refill: Capillary refill takes less than 2 seconds.     Coloration: Skin is not jaundiced.  Neurological:     General: No focal deficit present.     Mental Status: He is alert and oriented to person, place, and time. Mental status is at baseline.  Psychiatric:        Mood and Affect: Mood normal.        Behavior: Behavior normal.     ED Results / Procedures /  Treatments   Labs (all labs ordered are listed, but only abnormal results are displayed) Labs Reviewed  COMPREHENSIVE METABOLIC PANEL - Abnormal; Notable for the following components:      Result Value   CO2 21 (*)    Glucose, Bld 168 (*)    BUN 21 (*)    All other components within normal limits  CBC - Abnormal; Notable for the following components:   WBC 14.4 (*)    All other components within normal limits  LIPASE, BLOOD  URINALYSIS, ROUTINE W REFLEX MICROSCOPIC    EKG None  Radiology No results found.  Procedures Procedures (including critical care time)  Medications Ordered in ED Medications  ondansetron (ZOFRAN-ODT) disintegrating tablet 4 mg (4 mg Oral Not Given 05/29/20 0705)  ondansetron (ZOFRAN-ODT) disintegrating tablet 4 mg (4 mg Oral Given 05/29/20 0655)  sodium chloride 0.9 % bolus 1,000 mL (1,000 mLs Intravenous New Bag/Given 05/29/20 0701)    ED Course  I have reviewed the triage vital signs and the nursing notes.  Pertinent labs & imaging results that were available during my care of the patient were reviewed by me and considered in my medical decision making (see chart for details).    MDM Rules/Calculators/A&P                         24 y/o male with presenting with epigastric pain with associated nausea and vomiting. Labs with slight increase in WBC but unconcerning overall. Symptoms  likely related to gastritis or  cannabis hyperemesis. Feeling better with IVF, zofran, Gi cocktail, and haldol, and wishes to go home.  Final Clinical Impression(s) / ED Diagnoses Final diagnoses:  None    Rx / DC Orders ED Discharge Orders    None       Quincy Simmonds, MD 05/29/20 5697    Alvira Monday, MD 05/30/20 2252

## 2020-06-04 ENCOUNTER — Encounter (HOSPITAL_COMMUNITY): Payer: Self-pay | Admitting: Emergency Medicine

## 2020-06-04 ENCOUNTER — Emergency Department (HOSPITAL_COMMUNITY): Payer: Medicaid Other

## 2020-06-04 ENCOUNTER — Other Ambulatory Visit: Payer: Self-pay

## 2020-06-04 ENCOUNTER — Emergency Department (HOSPITAL_COMMUNITY)
Admission: EM | Admit: 2020-06-04 | Discharge: 2020-06-05 | Disposition: A | Discharge status: Home / Self Care | Payer: Medicaid Other | Attending: Emergency Medicine | Admitting: Emergency Medicine

## 2020-06-04 DIAGNOSIS — L03012 Cellulitis of left finger: Secondary | ICD-10-CM | POA: Insufficient documentation

## 2020-06-04 DIAGNOSIS — R2232 Localized swelling, mass and lump, left upper limb: Secondary | ICD-10-CM | POA: Insufficient documentation

## 2020-06-04 DIAGNOSIS — Z5321 Procedure and treatment not carried out due to patient leaving prior to being seen by health care provider: Secondary | ICD-10-CM | POA: Insufficient documentation

## 2020-06-04 NOTE — ED Triage Notes (Signed)
Patient accidentally hit a nail while working last week reports infected left distal 4th finger with swelling/drainage , no fever or chills .

## 2020-06-05 NOTE — ED Notes (Signed)
Patient states he just came to get antibiotic for his finger; Patient states that he can no longer wait to be seen by EDP and will return on tomorrow; Pt signed out AMA-Monique,RN

## 2020-10-24 ENCOUNTER — Emergency Department (HOSPITAL_COMMUNITY)
Admission: EM | Admit: 2020-10-24 | Discharge: 2020-10-24 | Disposition: A | Discharge status: Home / Self Care | Payer: Self-pay | Attending: Emergency Medicine | Admitting: Emergency Medicine

## 2020-10-24 ENCOUNTER — Encounter (HOSPITAL_COMMUNITY): Payer: Self-pay | Admitting: Emergency Medicine

## 2020-10-24 DIAGNOSIS — R1012 Left upper quadrant pain: Secondary | ICD-10-CM | POA: Insufficient documentation

## 2020-10-24 DIAGNOSIS — Z5321 Procedure and treatment not carried out due to patient leaving prior to being seen by health care provider: Secondary | ICD-10-CM | POA: Insufficient documentation

## 2020-10-24 LAB — CBC
HCT: 45.6 % (ref 39.0–52.0)
Hemoglobin: 15.6 g/dL (ref 13.0–17.0)
MCH: 30.4 pg (ref 26.0–34.0)
MCHC: 34.2 g/dL (ref 30.0–36.0)
MCV: 88.9 fL (ref 80.0–100.0)
Platelets: 267 10*3/uL (ref 150–400)
RBC: 5.13 MIL/uL (ref 4.22–5.81)
RDW: 12.7 % (ref 11.5–15.5)
WBC: 16.8 10*3/uL — ABNORMAL HIGH (ref 4.0–10.5)
nRBC: 0 % (ref 0.0–0.2)

## 2020-10-24 LAB — COMPREHENSIVE METABOLIC PANEL
ALT: 16 U/L (ref 0–44)
AST: 28 U/L (ref 15–41)
Albumin: 4.9 g/dL (ref 3.5–5.0)
Alkaline Phosphatase: 71 U/L (ref 38–126)
Anion gap: 14 (ref 5–15)
BUN: 19 mg/dL (ref 6–20)
CO2: 20 mmol/L — ABNORMAL LOW (ref 22–32)
Calcium: 10 mg/dL (ref 8.9–10.3)
Chloride: 106 mmol/L (ref 98–111)
Creatinine, Ser: 0.83 mg/dL (ref 0.61–1.24)
GFR, Estimated: >60 mL/min (ref >60)
Glucose, Bld: 168 mg/dL — ABNORMAL HIGH (ref 70–99)
Potassium: 4.2 mmol/L (ref 3.5–5.1)
Sodium: 140 mmol/L (ref 135–145)
Total Bilirubin: 0.7 mg/dL (ref 0.3–1.2)
Total Protein: 7.8 g/dL (ref 6.5–8.1)

## 2020-10-24 LAB — LIPASE, BLOOD: Lipase: 19 U/L (ref 11–51)

## 2020-10-24 NOTE — ED Triage Notes (Addendum)
Per EMS-states he has a history of gastritis-B/L upper quadrant pain-symptoms for 18 hours-EMS responded to call earlier today and was treated with Zofran and fluids-slept on the way to ED

## 2020-10-24 NOTE — ED Notes (Signed)
Pt called 3x. Eloped from waiting area.  

## 2020-12-31 ENCOUNTER — Ambulatory Visit (HOSPITAL_COMMUNITY)
Admission: EM | Admit: 2020-12-31 | Discharge: 2020-12-31 | Disposition: A | Discharge status: Other Acute Inpatient Hospital | Payer: Medicaid Other | Attending: Internal Medicine | Admitting: Internal Medicine

## 2020-12-31 ENCOUNTER — Encounter (HOSPITAL_COMMUNITY): Payer: Self-pay | Admitting: *Deleted

## 2020-12-31 ENCOUNTER — Other Ambulatory Visit: Payer: Self-pay

## 2020-12-31 ENCOUNTER — Emergency Department (HOSPITAL_COMMUNITY)
Admission: EM | Admit: 2020-12-31 | Discharge: 2020-12-31 | Disposition: A | Discharge status: Home / Self Care | Payer: Medicaid Other | Attending: Emergency Medicine | Admitting: Emergency Medicine

## 2020-12-31 ENCOUNTER — Encounter (HOSPITAL_COMMUNITY): Payer: Self-pay | Admitting: Emergency Medicine

## 2020-12-31 DIAGNOSIS — K2900 Acute gastritis without bleeding: Secondary | ICD-10-CM

## 2020-12-31 DIAGNOSIS — K2970 Gastritis, unspecified, without bleeding: Secondary | ICD-10-CM | POA: Insufficient documentation

## 2020-12-31 DIAGNOSIS — R1013 Epigastric pain: Secondary | ICD-10-CM

## 2020-12-31 DIAGNOSIS — K920 Hematemesis: Secondary | ICD-10-CM

## 2020-12-31 DIAGNOSIS — F17210 Nicotine dependence, cigarettes, uncomplicated: Secondary | ICD-10-CM | POA: Insufficient documentation

## 2020-12-31 LAB — CBC
HCT: 42.9 % (ref 39.0–52.0)
Hemoglobin: 15.4 g/dL (ref 13.0–17.0)
MCH: 31.6 pg (ref 26.0–34.0)
MCHC: 35.9 g/dL (ref 30.0–36.0)
MCV: 88.1 fL (ref 80.0–100.0)
Platelets: 263 10*3/uL (ref 150–400)
RBC: 4.87 MIL/uL (ref 4.22–5.81)
RDW: 13.1 % (ref 11.5–15.5)
WBC: 20.1 10*3/uL — ABNORMAL HIGH (ref 4.0–10.5)
nRBC: 0 % (ref 0.0–0.2)

## 2020-12-31 LAB — COMPREHENSIVE METABOLIC PANEL
ALT: 14 U/L (ref 0–44)
AST: 24 U/L (ref 15–41)
Albumin: 4.4 g/dL (ref 3.5–5.0)
Alkaline Phosphatase: 59 U/L (ref 38–126)
Anion gap: 11 (ref 5–15)
BUN: 23 mg/dL — ABNORMAL HIGH (ref 6–20)
CO2: 19 mmol/L — ABNORMAL LOW (ref 22–32)
Calcium: 9.7 mg/dL (ref 8.9–10.3)
Chloride: 107 mmol/L (ref 98–111)
Creatinine, Ser: 0.92 mg/dL (ref 0.61–1.24)
GFR, Estimated: >60 mL/min (ref >60)
Glucose, Bld: 175 mg/dL — ABNORMAL HIGH (ref 70–99)
Potassium: 3.6 mmol/L (ref 3.5–5.1)
Sodium: 137 mmol/L (ref 135–145)
Total Bilirubin: 0.5 mg/dL (ref 0.3–1.2)
Total Protein: 7.3 g/dL (ref 6.5–8.1)

## 2020-12-31 LAB — LIPASE, BLOOD: Lipase: 25 U/L (ref 11–51)

## 2020-12-31 MED ORDER — ONDANSETRON 4 MG PO TBDP
ORAL_TABLET | ORAL | Status: AC
  Filled 2020-12-31: qty 1

## 2020-12-31 MED ORDER — FAMOTIDINE 20 MG PO TABS: 20.0000 mg | ORAL_TABLET | Freq: Two times a day (BID) | ORAL | 0 refills | Status: DC

## 2020-12-31 MED ORDER — SODIUM CHLORIDE 0.9 % IV BOLUS
1000.0000 mL | Freq: Once | INTRAVENOUS | Status: AC
  Administered 2020-12-31: 1000 mL via INTRAVENOUS

## 2020-12-31 MED ORDER — METOCLOPRAMIDE HCL 5 MG/ML IJ SOLN
10.0000 mg | Freq: Once | INTRAMUSCULAR | Status: AC
  Administered 2020-12-31: 10 mg via INTRAVENOUS
  Filled 2020-12-31: qty 2

## 2020-12-31 MED ORDER — FAMOTIDINE IN NACL 20-0.9 MG/50ML-% IV SOLN
20.0000 mg | Freq: Once | INTRAVENOUS | Status: AC
  Administered 2020-12-31: 20 mg via INTRAVENOUS
  Filled 2020-12-31: qty 50

## 2020-12-31 MED ORDER — LIDOCAINE VISCOUS HCL 2 % MT SOLN
15.0000 mL | Freq: Once | OROMUCOSAL | Status: AC
  Administered 2020-12-31: 15 mL via ORAL
  Filled 2020-12-31: qty 15

## 2020-12-31 MED ORDER — ONDANSETRON 4 MG PO TBDP
4.0000 mg | ORAL_TABLET | Freq: Once | ORAL | Status: AC
  Administered 2020-12-31: 4 mg via ORAL

## 2020-12-31 MED ORDER — ALUM & MAG HYDROXIDE-SIMETH 200-200-20 MG/5ML PO SUSP
30.0000 mL | Freq: Once | ORAL | Status: AC
  Administered 2020-12-31: 30 mL via ORAL
  Filled 2020-12-31: qty 30

## 2020-12-31 NOTE — ED Provider Notes (Addendum)
[facility]    CSN: [id number] Arrival date & time: 12/31/20  1024      History   Chief Complaint Chief Complaint  Patient presents with  . Abdominal Pain  . Emesis    HPI Jaime Nixon is a 25 y.o. male.   HPI   Abdominal Pain: Patient with a complex history reports that he started vomiting and having abdominal pain around 5:30 AM this morning.  He reports that he has vomited over 40 times since 5:30 AM this morning. He states that the vomiting occurred first and then the abdominal pain is started.  His abdominal pain is mostly located in his entire upper abdomen but he also is having some right lower abdominal pain. He is not able to tolerate any foods or liquids.  He denies any diarrhea or constipation.  No fevers.  He does report 1-2 episodes of scantly bloody emesis. No known sick contacts. He requests a GI cocktail.   Past Medical History:  Diagnosis Date  . Current smoker   . History of substance use   . Seizures [facility])     Patient Active Problem List   Diagnosis Date Noted  . Cannabis abuse   . 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 Panic attacks 09/15/2016  . Bradycardia   . OD (overdose of drug), intentional self-harm, initial encounter (HCC) 09/10/2016  . Bradycardia, drug induced 09/10/2016  . Hypotension due to drugs 09/10/2016  . Overdose 09/10/2016  . Generalized abdominal pain   . Hyperkalemia   . Uncontrollable vomiting   . Seizure disorder (HCC) 05/26/2016  . Cannabinoid hyperemesis syndrome 05/26/2016  . Hypokalemia due to loss of potassium 05/26/2016  . Tobacco dependence 05/26/2016  . Nausea and vomiting 05/26/2016  . Leukocytosis 12/27/2015  . Abnormal finding on MRI of brain   . Pachymeningitis     History reviewed. No pertinent surgical history.     Home Medications    Prior to Admission medications   Medication Sig Start Date End Date Taking? Authorizing  Provider  nicotine polacrilex (NICORETTE) 2 MG gum Take 1 each (2 mg total) by mouth as needed for smoking cessation. Patient not taking: Reported on 12/08/2018 09/20/16   Armandina Stammer I, NP  ondansetron (ZOFRAN ODT) 4 MG disintegrating tablet Take 1 tablet (4 mg total) by mouth every 8 (eight) hours as needed for nausea or vomiting. 05/29/20   Alvira Monday, MD  ondansetron (ZOFRAN) 4 MG tablet Take 1 tablet (4 mg total) by mouth every 6 (six) hours. 02/07/20   Moshe Cipro, NP  pantoprazole (PROTONIX) 20 MG tablet Take 1 tablet (20 mg total) by mouth daily. 02/07/20   Moshe Cipro, NP  promethazine (PHENERGAN) 25 MG tablet Take 1 tablet (25 mg total) by mouth every 6 (six) hours as needed for nausea or vomiting. 12/13/18   Arthor Captain, PA-C  ranitidine (ZANTAC) 150 MG capsule Take 1 capsule (150 mg total) by mouth daily. Patient not taking: Reported on 12/13/2018 05/25/18   Hedges, Tinnie Gens, PA-C  sucralfate (CARAFATE) 1 g tablet Take 1 tablet (1 g total) by mouth 4 (four) times daily -  with meals and at bedtime. 02/07/20   Moshe Cipro, NP  traZODone (DESYREL) 50 MG tablet Take 1 tablet (50 mg) at bedtime: For insomnia Patient not taking: Reported on 12/08/2018 09/20/16   Armandina Stammer I, NP  FLUoxetine (PROZAC) 20 MG capsule Take 1 capsule (20 mg total) by mouth daily. For  depression Patient not taking: Reported on 02/24/2017 09/21/16 02/07/20  Armandina Stammer I, NP  levETIRAcetam (KEPPRA) 500 MG tablet Take 1 tablet (500 mg total) by mouth 2 (two) times daily. 12/13/18 02/07/20  Arthor Captain, PA-C    Family History Family History  Problem Relation Age of Onset  . Seizures Father   . Seizures Brother     Social History Social History   Tobacco Use  . Smoking status: Current Every Day Smoker    Packs/day: 1.00    Years: 10.00    Pack years: 10.00    Types: Cigarettes  . Smokeless tobacco: Never Used  Substance Use Topics  . Alcohol use: Not Currently    Comment:  rare use, but binges occasionally  . Drug use: Yes    Types: Marijuana    Comment: every other day     Allergies   Dexamethasone and Prednisone   Review of Systems Review of Systems  As stated above in HPI Physical Exam Triage Vital Signs ED Triage Vitals  Enc Vitals Group     BP 12/31/20 1040 113/66     Pulse Rate 12/31/20 1040 70     Resp 12/31/20 1040 20     Temp 12/31/20 1040 97.6 F (36.4 C)     Temp Source 12/31/20 1040 Oral     SpO2 12/31/20 1040 99 %     Weight --      Height --      Head Circumference --      Peak Flow --      Pain Score 12/31/20 1042 9     Pain Loc --      Pain Edu? --      Excl. in GC? --    No data found.  Updated Vital Signs BP 113/66 (BP Location: Left Arm)   Pulse 70   Temp 97.6 F (36.4 C) (Oral)   Resp 20   SpO2 99%   Physical Exam Vitals and nursing note reviewed.  Constitutional:      General: He is in acute distress.     Appearance: He is well-developed. He is ill-appearing. He is not toxic-appearing or diaphoretic.  HENT:     Head: Normocephalic.  Eyes:     Comments: No pallor or jaundice  Cardiovascular:     Rate and Rhythm: Normal rate and regular rhythm.     Heart sounds: Normal heart sounds.  Pulmonary:     Effort: Pulmonary effort is normal.     Breath sounds: Normal breath sounds.  Abdominal:     General: Abdomen is flat. Bowel sounds are increased.     Palpations: Abdomen is rigid.     Tenderness: There is generalized abdominal tenderness (severe). There is guarding and rebound. Positive signs include Murphy's sign and McBurney's sign.     Hernia: No hernia is present.  Neurological:     Mental Status: He is alert.      UC Treatments / Results  Labs (all labs ordered are listed, but only abnormal results are displayed) Labs Reviewed - No data to display  EKG   Radiology No results found.  Procedures Procedures (including critical care time)  Medications Ordered in UC Medications   ondansetron (ZOFRAN-ODT) disintegrating tablet 4 mg (4 mg Oral Given 12/31/20 1047)    Initial Impression / Assessment and Plan / UC Course  I have reviewed the triage vital signs and the nursing notes.  Pertinent labs & imaging results that were available during my care  of the patient were reviewed by me and considered in my medical decision making (see chart for details).     New.  I discussed my concerns with patient.  His vomiting has improved since being given Zofran here in office and he requested GI cocktail however I discussed with patient that given his history and physical exam findings that he will need to be evaluated in the emergency room.  Final Clinical Impressions(s) / UC Diagnoses   Final diagnoses:  None   Discharge Instructions   None    ED Prescriptions    None     PDMP not reviewed this encounter.   Rushie Chestnut, PA-C 12/31/20 1100    Rushie Chestnut, [place] 12/31/20 1103

## 2020-12-31 NOTE — ED Triage Notes (Signed)
Pt reports he stopped smoking marijuana 2 weeks ago.

## 2020-12-31 NOTE — ED Notes (Signed)
Patient Alert and oriented to baseline. Stable and ambulatory to baseline. Patient verbalized understanding of the discharge instructions.  Patient belongings were taken by the patient.  ° °

## 2020-12-31 NOTE — Discharge Instructions (Addendum)
Please have your wife drive you directly to the emergency room

## 2020-12-31 NOTE — ED Triage Notes (Signed)
Pt reports ABD pain and Vomiting started at 0530 this morning. Pt reports he has vomited 40 times since 0530.

## 2020-12-31 NOTE — ED Triage Notes (Signed)
Pt sent from [facility] for generalized abd pain since 5:30am and vomiting 40+ times.  Denies diarrhea.  Stopped using marijuana 2 weeks ago.

## 2020-12-31 NOTE — ED Provider Notes (Signed)
[facility] EMERGENCY DEPARTMENT Provider Note   CSN: [id number] Arrival date & time: 12/31/20  1113     History No chief complaint on file.   Jaime Nixon is a 25 y.o. male with a past medical history of substance abuse presenting to the ED with a chief complaint of abdominal pain.  Woke up at 530 this morning with severe abdominal pain with several episodes of vomiting.  Reports a few episodes of vomiting streaks of blood.  Denies any changes to bowel movements or urination.  States that he has had similar episodes in the past that have improved with a GI cocktail.  He reports pain is located in the upper abdomen and worse with vomiting.  Denies any prior abdominal surgeries.  He has not tried any medications at home.  States that he stopped using marijuana about 2 weeks ago.  Denies any alcohol use.  No history of kidney stones.  No chest pain, shortness of breath, fever or suspicious food intake that would have triggered this pain last night.  HPI     Past Medical History:  Diagnosis Date  . Current smoker   . History of substance use   . Seizures [facility])     Patient Active Problem List   Diagnosis Date Noted  . Cannabis abuse   . 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 Panic attacks 09/15/2016  . Bradycardia   . OD (overdose of drug), intentional self-harm, initial encounter (HCC) 09/10/2016  . Bradycardia, drug induced 09/10/2016  . Hypotension due to drugs 09/10/2016  . Overdose 09/10/2016  . Generalized abdominal pain   . Hyperkalemia   . Uncontrollable vomiting   . Seizure disorder (HCC) 05/26/2016  . Cannabinoid hyperemesis syndrome 05/26/2016  . Hypokalemia due to loss of potassium 05/26/2016  . Tobacco dependence 05/26/2016  . Nausea and vomiting 05/26/2016  . Leukocytosis 12/27/2015  . Abnormal finding on MRI of brain   . Pachymeningitis     History reviewed. No pertinent surgical  history.     Family History  Problem Relation Age of Onset  . Seizures Father   . Seizures Brother     Social History   Tobacco Use  . Smoking status: Current Every Day Smoker    Packs/day: 1.00    Years: 10.00    Pack years: 10.00    Types: Cigarettes  . Smokeless tobacco: Never Used  Substance Use Topics  . Alcohol use: Not Currently    Comment: rare use, but binges occasionally  . Drug use: Yes    Types: Marijuana    Comment: every other day    Home Medications Prior to Admission medications   Medication Sig Start Date End Date Taking? Authorizing Provider  famotidine (PEPCID) 20 MG tablet Take 1 tablet (20 mg total) by mouth 2 (two) times daily. 12/31/20  Yes Khatri, Hina, PA-C  nicotine polacrilex (NICORETTE) 2 MG gum Take 1 each (2 mg total) by mouth as needed for smoking cessation. Patient not taking: Reported on 12/08/2018 09/20/16   Armandina Stammer I, NP  ondansetron (ZOFRAN ODT) 4 MG disintegrating tablet Take 1 tablet (4 mg total) by mouth every 8 (eight) hours as needed for nausea or vomiting. 05/29/20   Alvira Monday, MD  ondansetron (ZOFRAN) 4 MG tablet Take 1 tablet (4 mg total) by mouth every 6 (six) hours. 02/07/20   Moshe Cipro, NP  pantoprazole (PROTONIX) 20 MG tablet Take 1 tablet (20 mg  total) by mouth daily. 02/07/20   Moshe CiproMatthews, Stephanie, NP  promethazine (PHENERGAN) 25 MG tablet Take 1 tablet (25 mg total) by mouth every 6 (six) hours as needed for nausea or vomiting. 12/13/18   Arthor CaptainHarris, Abigail, PA-C  ranitidine (ZANTAC) 150 MG capsule Take 1 capsule (150 mg total) by mouth daily. Patient not taking: Reported on 12/13/2018 05/25/18   Hedges, Tinnie GensJeffrey, PA-C  sucralfate (CARAFATE) 1 g tablet Take 1 tablet (1 g total) by mouth 4 (four) times daily -  with meals and at bedtime. 02/07/20   Moshe CiproMatthews, Stephanie, NP  traZODone (DESYREL) 50 MG tablet Take 1 tablet (50 mg) at bedtime: For insomnia Patient not taking: Reported on 12/08/2018 09/20/16   Armandina StammerNwoko, Agnes I,  NP  FLUoxetine (PROZAC) 20 MG capsule Take 1 capsule (20 mg total) by mouth daily. For depression Patient not taking: Reported on 02/24/2017 09/21/16 02/07/20  Armandina StammerNwoko, Agnes I, NP  levETIRAcetam (KEPPRA) 500 MG tablet Take 1 tablet (500 mg total) by mouth 2 (two) times daily. 12/13/18 02/07/20  Arthor CaptainHarris, Abigail, PA-C    Allergies    Dexamethasone and Prednisone  Review of Systems   Review of Systems  Constitutional: Negative for appetite change, chills and fever.  HENT: Negative for ear pain, rhinorrhea, sneezing and sore throat.   Eyes: Negative for photophobia and visual disturbance.  Respiratory: Negative for cough, chest tightness, shortness of breath and wheezing.   Cardiovascular: Negative for chest pain and palpitations.  Gastrointestinal: Positive for abdominal pain, nausea and vomiting. Negative for blood in stool, constipation and diarrhea.  Genitourinary: Negative for dysuria, hematuria and urgency.  Musculoskeletal: Negative for myalgias.  Skin: Negative for rash.  Neurological: Negative for dizziness, weakness and light-headedness.    Physical Exam Updated Vital Signs BP (!) 125/95 (BP Location: Right Arm)   Pulse 97   Temp (!) 97.5 F (36.4 C)   Resp 20   SpO2 99%   Physical Exam Vitals and nursing note reviewed.  Constitutional:      General: He is not in acute distress.    Appearance: He is well-developed and well-nourished.  HENT:     Head: Normocephalic and atraumatic.     Nose: Nose normal.  Eyes:     General: No scleral icterus.       Left eye: No discharge.     Extraocular Movements: EOM normal.     Conjunctiva/sclera: Conjunctivae normal.  Cardiovascular:     Rate and Rhythm: Normal rate and regular rhythm.     Pulses: Intact distal pulses.     Heart sounds: Normal heart sounds. No murmur heard. No friction rub. No gallop.   Pulmonary:     Effort: Pulmonary effort is normal. No respiratory distress.     Breath sounds: Normal breath sounds.   Abdominal:     General: Bowel sounds are normal. There is no distension.     Palpations: Abdomen is soft.     Tenderness: There is abdominal tenderness (Epigastric). There is no guarding.  Musculoskeletal:        General: No edema. Normal range of motion.     Cervical back: Normal range of motion and neck supple.  Skin:    General: Skin is warm and dry.     Findings: No rash.  Neurological:     Mental Status: He is alert.     Motor: No abnormal muscle tone.     Coordination: Coordination normal.  Psychiatric:        Mood and Affect: Mood  and affect normal.     ED Results / Procedures / Treatments   Labs (all labs ordered are listed, but only abnormal results are displayed) Labs Reviewed  COMPREHENSIVE METABOLIC PANEL - Abnormal; Notable for the following components:      Result Value   CO2 19 (*)    Glucose, Bld 175 (*)    BUN 23 (*)    All other components within normal limits  CBC - Abnormal; Notable for the following components:   WBC 20.1 (*)    All other components within normal limits  LIPASE, BLOOD    EKG None  Radiology No results found.  Procedures Procedures   Medications Ordered in ED Medications  alum & mag hydroxide-simeth (MAALOX/MYLANTA) 200-200-20 MG/5ML suspension 30 mL (30 mLs Oral Given 12/31/20 1200)    And  lidocaine (XYLOCAINE) 2 % viscous mouth solution 15 mL (15 mLs Oral Given 12/31/20 1200)  famotidine (PEPCID) IVPB 20 mg premix (0 mg Intravenous Stopped 12/31/20 1333)  sodium chloride 0.9 % bolus 1,000 mL (0 mLs Intravenous Stopped 12/31/20 1352)  metoCLOPramide (REGLAN) injection 10 mg (10 mg Intravenous Given 12/31/20 1355)    ED Course  I have reviewed the triage vital signs and the nursing notes.  Pertinent labs & imaging results that were available during my care of the patient were reviewed by me and considered in my medical decision making (see chart for details).  Clinical Course as of 12/31/20 1451  Sun Dec 31, 2020  1144 Patient  continues to have vomiting.  Will reassess after additional dose of antiemetics. [HK]    Clinical Course User Index [HK] Dietrich Pates, PA-C   MDM Rules/Calculators/A&P                          25 year old male presenting to the ED with a chief complaint of epigastric pain and periumbilical abdominal pain.  Symptoms began approximately 5:30 AM and woke him up from his sleep.  Has had several episodes of emesis with a few being streaked with blood.  States that he has had similar symptoms in the past that improved with a GI cocktail.  No prior abdominal surgeries.  No changes to bowel movements, urination, chest pain or shortness of breath.  On exam patient with tenderness in the upper abdomen without rebound or guarding.  Patient has no lower abdominal tenderness on my exam however urgent care note states specific right lower quadrant pain, guarding and rebound tenderness.  Patient does appear uncomfortable on exam.  States that he was given Zofran at urgent care which improved his nausea but continues to have pain.  Will give GI cocktail, Pepcid, IV fluids and reassess after lab work.  Lab work significant for leukocytosis of 20.  CBC otherwise unremarkable.  Lipase unremarkable.  CMP without any abnormalities.  Patient with significant improvement in pain and nausea with medications including GI cocktail, antiemetic and Pepcid given.  I reviewed urgent care note but patient does not have any lower abdominal tenderness.  Repeat abdominal exams are benign.  I discussed possibility of imaging with him based on his leukocytosis and previous exam at the urgent care.  He states that he does not want any imaging of his abdomen at this time.  He understands that we are unable to evaluate for cholecystitis, appendicitis or other surgical emergent cause of his symptoms but he feels that this is similar to his reflux that he has experienced in the past.  He is requesting medication to help with this to take at home.   He is able to tolerate p.o. intake without difficulty here.  He knows to return for any worsening symptoms at that time may need imaging.   Patient is hemodynamically stable, in NAD, and able to ambulate in the ED. Evaluation does not show pathology that would require ongoing emergent intervention or inpatient treatment. I explained the diagnosis to the patient. Pain has been managed and has no complaints prior to discharge. Patient is comfortable with above plan and is stable for discharge at this time. All questions were answered prior to disposition. Strict return precautions for returning to the ED were discussed. Encouraged follow up with PCP.   An After Visit Summary was printed and given to the patient.   Portions of this note were generated with Scientist, clinical (histocompatibility and immunogenetics). Dictation errors may occur despite best attempts at proofreading.  Final Clinical Impression(s) / ED Diagnoses Final diagnoses:  Acute gastritis without hemorrhage, unspecified gastritis type    Rx / DC Orders ED Discharge Orders         Ordered    famotidine (PEPCID) 20 MG tablet  2 times daily        12/31/20 1449           Dietrich Pates, PA-C 12/31/20 1451    Tegeler, Canary Brim, MD 12/31/20 [phone]

## 2020-12-31 NOTE — Discharge Instructions (Signed)
Take the Pepcid as directed daily. Make sure you are avoiding the foods listed below that can worsen your reflux and gastritis. Follow-up with your primary care provider and the GI doctor listed below. We spoke about potential imaging of your abdomen and pelvis today to evaluate for other cause of your pain.  He did not want any imaging today but you can return at any time if your symptoms worsen and be reevaluated then.

## 2021-02-09 ENCOUNTER — Other Ambulatory Visit: Payer: Self-pay

## 2021-02-09 ENCOUNTER — Encounter (HOSPITAL_COMMUNITY): Payer: Self-pay | Admitting: Pharmacy Technician

## 2021-02-09 ENCOUNTER — Emergency Department (HOSPITAL_COMMUNITY): Payer: Self-pay

## 2021-02-09 ENCOUNTER — Emergency Department (HOSPITAL_COMMUNITY)
Admission: EM | Admit: 2021-02-09 | Discharge: 2021-02-10 | Disposition: A | Discharge status: Home / Self Care | Payer: Self-pay | Attending: Emergency Medicine | Admitting: Emergency Medicine

## 2021-02-09 DIAGNOSIS — S61012A Laceration without foreign body of left thumb without damage to nail, initial encounter: Secondary | ICD-10-CM | POA: Insufficient documentation

## 2021-02-09 DIAGNOSIS — Z23 Encounter for immunization: Secondary | ICD-10-CM | POA: Insufficient documentation

## 2021-02-09 DIAGNOSIS — W268XXA Contact with other sharp object(s), not elsewhere classified, initial encounter: Secondary | ICD-10-CM | POA: Insufficient documentation

## 2021-02-09 DIAGNOSIS — F17210 Nicotine dependence, cigarettes, uncomplicated: Secondary | ICD-10-CM | POA: Insufficient documentation

## 2021-02-09 MED ORDER — TETANUS-DIPHTH-ACELL PERTUSSIS 5-2.5-18.5 LF-MCG/0.5 IM SUSY
0.5000 mL | PREFILLED_SYRINGE | Freq: Once | INTRAMUSCULAR | Status: AC
  Administered 2021-02-10: 0.5 mL via INTRAMUSCULAR
  Filled 2021-02-09: qty 0.5

## 2021-02-09 NOTE — ED Provider Notes (Addendum)
Patient placed in Quick Look pathway, seen and evaluated   Chief Complaint: laceration left thumb  HPI:   Pt cut the top of his thumb with a box cutter   ROS: no numbness no fever,   Physical Exam:   Gen: No distress  Neuro: Awake and Alert  Skin: Warm    Focused Exam: bleeding controlled Lungs clear  Hear rrr  Initiation of care has begun. The patient has been counseled on the process, plan, and necessity for staying for the completion/evaluation, and the remainder of the medical screening examination   Osie Cheeks 02/09/21 1828    Elson Areas, PA-C 02/09/21 1829    Sabino Donovan, MD 02/09/21 2250

## 2021-02-09 NOTE — ED Triage Notes (Signed)
Pt here with L thumb laceration to top of knuckle. Pt unknown last tetanus.

## 2021-02-10 NOTE — ED Provider Notes (Signed)
[facility] EMERGENCY DEPARTMENT Provider Note   CSN: [id number] Arrival date & time: 02/09/21  1803     History Chief Complaint  Patient presents with  . Laceration    Jaime Nixon is a 25 y.o. male.  The history is provided by the patient.  Laceration Location:  Finger Finger laceration location:  L thumb Length:  2cm Pain details:    Severity:  Mild   Timing:  Constant   Progression:  Unchanged Relieved by:  Nothing Worsened by:  Nothing Tetanus status:  Unknown Associated symptoms: no fever and no redness    Patient presents for laceration to the left thumb.  Patient works at [employer] and cut it at work.  This occurred around 8 AM on April 15  bleeding controlled.  No other injuries    Past Medical History:  Diagnosis Date  . Current smoker   . History of substance use   . Seizures [facility])     Patient Active Problem List   Diagnosis Date Noted  . Cannabis abuse   . 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 Panic attacks 09/15/2016  . Bradycardia   . OD (overdose of drug), intentional self-harm, initial encounter (HCC) 09/10/2016  . Bradycardia, drug induced 09/10/2016  . Hypotension due to drugs 09/10/2016  . Overdose 09/10/2016  . Generalized abdominal pain   . Hyperkalemia   . Uncontrollable vomiting   . Seizure disorder (HCC) 05/26/2016  . Cannabinoid hyperemesis syndrome 05/26/2016  . Hypokalemia due to loss of potassium 05/26/2016  . Tobacco dependence 05/26/2016  . Nausea and vomiting 05/26/2016  . Leukocytosis 12/27/2015  . Abnormal finding on MRI of brain   . Pachymeningitis     History reviewed. No pertinent surgical history.     Family History  Problem Relation Age of Onset  . Seizures Father   . Seizures Brother     Social History   Tobacco Use  . Smoking status: Current Every Day Smoker    Packs/day: 1.00    Years: 10.00    Pack years:  10.00    Types: Cigarettes  . Smokeless tobacco: Never Used  Substance Use Topics  . Alcohol use: Not Currently    Comment: rare use, but binges occasionally  . Drug use: Yes    Types: Marijuana    Comment: every other day    Home Medications Prior to Admission medications   Medication Sig Start Date End Date Taking? Authorizing Provider  famotidine (PEPCID) 20 MG tablet Take 1 tablet (20 mg total) by mouth 2 (two) times daily. 12/31/20   Khatri, Hina, PA-C  nicotine polacrilex (NICORETTE) 2 MG gum Take 1 each (2 mg total) by mouth as needed for smoking cessation. Patient not taking: Reported on 12/08/2018 09/20/16   Armandina Stammer I, NP  ondansetron (ZOFRAN ODT) 4 MG disintegrating tablet Take 1 tablet (4 mg total) by mouth every 8 (eight) hours as needed for nausea or vomiting. 05/29/20   Alvira Monday, MD  ondansetron (ZOFRAN) 4 MG tablet Take 1 tablet (4 mg total) by mouth every 6 (six) hours. 02/07/20   Moshe Cipro, NP  pantoprazole (PROTONIX) 20 MG tablet Take 1 tablet (20 mg total) by mouth daily. 02/07/20   Moshe Cipro, NP  promethazine (PHENERGAN) 25 MG tablet Take 1 tablet (25 mg total) by mouth every 6 (six) hours as needed for nausea or vomiting. 12/13/18   Arthor Captain, PA-C  ranitidine (ZANTAC) 150  MG capsule Take 1 capsule (150 mg total) by mouth daily. Patient not taking: Reported on 12/13/2018 05/25/18   Hedges, Tinnie Gens, PA-C  sucralfate (CARAFATE) 1 g tablet Take 1 tablet (1 g total) by mouth 4 (four) times daily -  with meals and at bedtime. 02/07/20   Moshe Cipro, NP  traZODone (DESYREL) 50 MG tablet Take 1 tablet (50 mg) at bedtime: For insomnia Patient not taking: Reported on 12/08/2018 09/20/16   Armandina Stammer I, NP  FLUoxetine (PROZAC) 20 MG capsule Take 1 capsule (20 mg total) by mouth daily. For depression Patient not taking: Reported on 02/24/2017 09/21/16 02/07/20  Armandina Stammer I, NP  levETIRAcetam (KEPPRA) 500 MG tablet Take 1 tablet (500 mg  total) by mouth 2 (two) times daily. 12/13/18 02/07/20  Arthor Captain, PA-C    Allergies    Dexamethasone and Prednisone  Review of Systems   Review of Systems  Constitutional: Negative for fever.  Skin: Positive for wound.    Physical Exam Updated Vital Signs BP 128/73   Pulse 66   Temp (!) 97 F (36.1 C)   Resp 18   SpO2 99%   Physical Exam CONSTITUTIONAL: Well developed/well nourished HEAD: Normocephalic/atraumatic EYES: EOMI/PERRL NECK: supple no meningeal signs LUNGS:  no apparent distress NEURO: Pt is awake/alert/appropriate, moves all extremitiesx4.  No facial droop.   EXTREMITIES: pulses normal/equal, full ROM, laceration noted to extensor surface of left thumb, see photo SKIN: warm, color normal see photo PSYCH: no abnormalities of mood noted, alert and oriented to situation     Patient gave verbal permission to utilize photo for medical documentation only The image was not stored on any personal device ED Results / Procedures / Treatments   Labs (all labs ordered are listed, but only abnormal results are displayed) Labs Reviewed - No data to display  EKG None  Radiology DG Finger Thumb Left  Result Date: 02/09/2021 CLINICAL DATA:  Laceration with a tool.  Pain at the distal phalanx. EXAM: LEFT THUMB 2+V COMPARISON:  None. FINDINGS: There is laceration of the soft tissues along the anterior aspect of the thumb at the interphalangeal joint region. There is no acute fracture or subluxation. No radiopaque foreign body. IMPRESSION: Soft tissue laceration.  No acute fracture. Electronically Signed   By: Norva Pavlov M.D.   On: 02/09/2021 18:59    Procedures .Marland KitchenLaceration Repair  Date/Time: 02/10/2021 4:20 AM Performed by: Zadie Rhine, MD Authorized by: Zadie Rhine, MD   Consent:    Consent obtained:  Verbal   Consent given by:  Patient   Risks discussed:  Pain Universal protocol:    Patient identity confirmed:  Provided demographic  data Anesthesia:    Anesthesia method:  Local infiltration   Local anesthetic:  Lidocaine 1% w/o epi Laceration details:    Location:  Finger   Finger location:  L thumb   Length (cm):  2 Exploration:    Wound exploration: wound explored through full range of motion and entire depth of wound visualized     Wound extent: no tendon damage noted, no underlying fracture noted and no vascular damage noted     Contaminated: no   Treatment:    Area cleansed with:  Povidone-iodine   Amount of cleaning:  Standard   Irrigation solution:  Sterile water Skin repair:    Repair method:  Sutures   Suture size:  4-0   Suture material:  Prolene   Suture technique:  Simple interrupted   Number of sutures:  3  Approximation:    Approximation:  Close Repair type:    Repair type:  Simple Post-procedure details:    Procedure completion:  Tolerated well, no immediate complications     Medications Ordered in ED Medications  Tdap (BOOSTRIX) injection 0.5 mL (0.5 mLs Intramuscular Given 02/10/21 0313)    ED Course  I have reviewed the triage vital signs and the nursing notes.      MDM Rules/Calculators/A&P                            Patient presents with laceration left thumb.  This occurred yesterday morning, but the wound is very clean.  3 sutures were applied without difficulty.  There was no tendon exposure.  He had full flexion and extension in the thumb without any difficulty.  No foreign bodies noted.  Low suspicion for occult fracture.  He is safe for discharge home.  Advised use of splint to protect the wound given his line of work Final Clinical Impression(s) / ED Diagnoses Final diagnoses:  Laceration of left thumb without foreign body without damage to nail, initial encounter    Rx / DC Orders ED Discharge Orders    None       Zadie Rhine, MD 02/10/21 [id number]

## 2021-04-11 ENCOUNTER — Encounter (HOSPITAL_COMMUNITY): Payer: Self-pay

## 2021-04-11 ENCOUNTER — Other Ambulatory Visit: Payer: Self-pay

## 2021-04-11 ENCOUNTER — Ambulatory Visit (HOSPITAL_COMMUNITY)
Admission: RE | Admit: 2021-04-11 | Discharge: 2021-04-11 | Disposition: A | Discharge status: Home / Self Care | Payer: Medicaid Other | Source: Ambulatory Visit | Attending: Urgent Care | Admitting: Urgent Care

## 2021-04-11 VITALS — BP 113/61 | HR 94 | Temp 98.5°F | Resp 18

## 2021-04-11 DIAGNOSIS — M79645 Pain in left finger(s): Secondary | ICD-10-CM

## 2021-04-11 DIAGNOSIS — M7989 Other specified soft tissue disorders: Secondary | ICD-10-CM

## 2021-04-11 MED ORDER — NAPROXEN 375 MG PO TABS: 375.0000 mg | ORAL_TABLET | Freq: Two times a day (BID) | ORAL | 0 refills | Status: DC

## 2021-04-11 NOTE — ED Provider Notes (Signed)
[facility]   MRN: [id number] DOB: [birth year]  Subjective:   Jaime Nixon is a 25 y.o. male presenting for checkup on his left thumb.  Patient states that he had stitches taken out weeks ago.  He has since felt intermittent random sharp stinging pains over the dorsal aspect of his knuckle where he had the stitches.  Has not used any medications for pain.  Patient states that he works with his hands quite frequently and also fidgets.  Denies fever, red streaks, drainage of pus or bleeding.  No current facility-administered medications for this encounter.  Current Outpatient Medications:    famotidine (PEPCID) 20 MG tablet, Take 1 tablet (20 mg total) by mouth 2 (two) times daily., Disp: 30 tablet, Rfl: 0   nicotine polacrilex (NICORETTE) 2 MG gum, Take 1 each (2 mg total) by mouth as needed for smoking cessation. (Patient not taking: Reported on 12/08/2018), Disp: 100 tablet, Rfl: 0   ondansetron (ZOFRAN ODT) 4 MG disintegrating tablet, Take 1 tablet (4 mg total) by mouth every 8 (eight) hours as needed for nausea or vomiting., Disp: 20 tablet, Rfl: 0   ondansetron (ZOFRAN) 4 MG tablet, Take 1 tablet (4 mg total) by mouth every 6 (six) hours., Disp: 12 tablet, Rfl: 0   pantoprazole (PROTONIX) 20 MG tablet, Take 1 tablet (20 mg total) by mouth daily., Disp: 30 tablet, Rfl: 1   promethazine (PHENERGAN) 25 MG tablet, Take 1 tablet (25 mg total) by mouth every 6 (six) hours as needed for nausea or vomiting., Disp: 12 tablet, Rfl: 0   ranitidine (ZANTAC) 150 MG capsule, Take 1 capsule (150 mg total) by mouth daily. (Patient not taking: Reported on 12/13/2018), Disp: 30 capsule, Rfl: 0   sucralfate (CARAFATE) 1 g tablet, Take 1 tablet (1 g total) by mouth 4 (four) times daily -  with meals and at bedtime., Disp: 30 tablet, Rfl: 0   traZODone (DESYREL) 50 MG tablet, Take 1 tablet (50 mg) at bedtime: For insomnia (Patient not taking: Reported on 12/08/2018), Disp: 30 tablet, Rfl: 0    Allergies  Allergen Reactions   Dexamethasone Shortness Of Breath and Other (See Comments)    Reaction:  Makes pt angry    Prednisone Other (See Comments)    Reaction:  Makes pt angry     Past Medical History:  Diagnosis Date   Current smoker    History of substance use    Seizures (HCC)      History reviewed. No pertinent surgical history.  Family History  Problem Relation Age of Onset   Seizures Father    Seizures Brother     Social History   Tobacco Use   Smoking status: Every Day    Packs/day: 1.00    Years: 10.00    Pack years: 10.00    Types: Cigarettes   Smokeless tobacco: Never  Substance Use Topics   Alcohol use: Not Currently    Comment: rare use, but binges occasionally   Drug use: Yes    Types: Marijuana    Comment: every other day    ROS   Objective:   Vitals: BP 113/61 (BP Location: Right Arm)   Pulse 94   Temp 98.5 F (36.9 C) (Oral)   Resp 18   SpO2 99%   Physical Exam Constitutional:      General: He is not in acute distress.    Appearance: Normal appearance. He is well-developed and normal weight. He is not ill-appearing, toxic-appearing  or diaphoretic.  HENT:     Head: Normocephalic and atraumatic.     Right Ear: External ear normal.     Left Ear: External ear normal.     Nose: Nose normal.     Mouth/Throat:     Pharynx: Oropharynx is clear.  Eyes:     General: No scleral icterus.       Right eye: No discharge.        Left eye: No discharge.     Extraocular Movements: Extraocular movements intact.     Pupils: Pupils are equal, round, and reactive to light.  Cardiovascular:     Rate and Rhythm: Normal rate.  Pulmonary:     Effort: Pulmonary effort is normal.  Musculoskeletal:       Hands:     Cervical back: Normal range of motion.  Neurological:     Mental Status: He is alert and oriented to person, place, and time.  Psychiatric:        Mood and Affect: Mood normal.        Behavior: Behavior normal.         Thought Content: Thought content normal.        Judgment: Judgment normal.      Assessment and Plan :   PDMP not reviewed this encounter.  1. Pain of left thumb   2. Thumb swelling     Recommended conservative management with NSAID, modification of how he is using his left arm as best as possible.  Low suspicion for infectious process. Counseled patient on potential for adverse effects with medications prescribed/recommended today, ER and return-to-clinic precautions discussed, patient verbalized understanding.    Wallis Bamberg, [place] 04/11/21 2197

## 2021-04-11 NOTE — ED Triage Notes (Signed)
Pt c/o thumb pain and swelling after the stitches fell out weeks ago.

## 2021-05-23 ENCOUNTER — Other Ambulatory Visit: Payer: Self-pay

## 2021-05-23 ENCOUNTER — Ambulatory Visit
Admission: RE | Admit: 2021-05-23 | Discharge: 2021-05-23 | Disposition: A | Discharge status: Home / Self Care | Payer: Medicaid Other | Source: Ambulatory Visit | Attending: Nurse Practitioner | Admitting: Nurse Practitioner

## 2021-05-23 VITALS — BP 132/81 | HR 74 | Temp 98.0°F | Resp 14

## 2021-05-23 DIAGNOSIS — L255 Unspecified contact dermatitis due to plants, except food: Secondary | ICD-10-CM

## 2021-05-23 DIAGNOSIS — L299 Pruritus, unspecified: Secondary | ICD-10-CM

## 2021-05-23 MED ORDER — HYDROXYZINE HCL 25 MG PO TABS: 25.0000 mg | ORAL_TABLET | Freq: Four times a day (QID) | ORAL | 0 refills | Status: DC | PRN

## 2021-05-23 MED ORDER — CETIRIZINE HCL 10 MG PO TABS: 10.0000 mg | ORAL_TABLET | Freq: Every day | ORAL | 0 refills | Status: DC

## 2021-05-23 NOTE — ED Provider Notes (Signed)
[facility]    CSN: [id number] Arrival date & time: 05/23/21  0845      History   Chief Complaint Chief Complaint  Patient presents with   Poison Ivy    HPI Coralie Keenshomas J Wakefield is a 25 y.o. male.    Coralie Keenshomas J Barnfield is a 25 y.o. male that presents for evaluation of rash. The rash is located on his arms and legs. Onset of symptoms was gradual starting 3 days ago after helping a friend put up a dog fence in their back yard. Symptoms have been gradually worsening since that time.. Symptoms include itching. No weeping, blisters or pain noted. Care prior to arrival consisted of calamine lotion, with minimal relief.     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 No pertinent surgical history.     Home Medications    Prior to Admission medications   Medication Sig Start Date End Date Taking? Authorizing Provider  cetirizine (ZYRTEC) 10 MG tablet Take 1 tablet (10 mg total) by mouth daily. 05/23/21  Yes Lurline IdolMurrill, Sixto Bowdish, FNP  hydrOXYzine (ATARAX/VISTARIL) 25 MG tablet Take 1 tablet (25 mg total) by mouth every 6 (six) hours as needed for  itching. 05/23/21  Yes Lurline IdolMurrill, Aracelie Addis, FNP  famotidine (PEPCID) 20 MG tablet Take 1 tablet (20 mg total) by mouth 2 (two) times daily. 12/31/20   Khatri, Hina, PA-C  naproxen (NAPROSYN) 375 MG tablet Take 1 tablet (375 mg total) by mouth 2 (two) times daily with a meal. 04/11/21   Wallis BambergMani, Mario, PA-C  nicotine polacrilex (NICORETTE) 2 MG gum Take 1 each (2 mg total) by mouth as needed for smoking cessation. Patient not taking: Reported on 12/08/2018 09/20/16   Armandina StammerNwoko, Agnes I, NP  ondansetron (ZOFRAN ODT) 4 MG disintegrating tablet Take 1 tablet (4 mg total) by mouth every 8 (eight) hours as needed for nausea or vomiting. 05/29/20   Alvira MondaySchlossman, Erin, MD  ondansetron (ZOFRAN) 4 MG tablet Take 1 tablet (4 mg total) by mouth every 6 (six) hours. 02/07/20   Moshe CiproMatthews, Stephanie, NP  pantoprazole (PROTONIX) 20 MG tablet Take 1 tablet (20 mg total) by mouth daily. 02/07/20   Moshe CiproMatthews, Stephanie, NP  promethazine (PHENERGAN) 25 MG tablet Take 1 tablet (25 mg total) by mouth every 6 (six) hours as needed for nausea or vomiting. 12/13/18   Arthor CaptainHarris, Abigail, PA-C  ranitidine (ZANTAC) 150 MG capsule Take 1 capsule (150 mg total) by mouth daily. Patient not taking: Reported on 12/13/2018 05/25/18   Hedges, Tinnie GensJeffrey, PA-C  sucralfate (CARAFATE) 1 g tablet Take 1 tablet (1 g total) by mouth 4 (four)  times daily -  with meals and at bedtime. 02/07/20   Moshe Cipro, NP  traZODone (DESYREL) 50 MG tablet Take 1 tablet (50 mg) at bedtime: For insomnia Patient not taking: Reported on 12/08/2018 09/20/16   Armandina Stammer I, NP  FLUoxetine (PROZAC) 20 MG capsule Take 1 capsule (20 mg total) by mouth daily. For depression Patient not taking: Reported on 02/24/2017 09/21/16 02/07/20  Armandina Stammer I, NP  levETIRAcetam (KEPPRA) 500 MG tablet Take 1 tablet (500 mg total) by mouth 2 (two) times daily. 12/13/18 02/07/20  Arthor Captain, PA-C    Family History Family History  Problem Relation Age of Onset   Seizures Father    Seizures  Brother     Social History Social History   Tobacco Use   Smoking status: Every Day    Packs/day: 1.00    Years: 10.00    Pack years: 10.00    Types: Cigarettes   Smokeless tobacco: Never  Substance Use Topics   Alcohol use: Not Currently    Comment: rare use, but binges occasionally   Drug use: Yes    Types: Marijuana    Comment: every other day     Allergies   Dexamethasone and Prednisone   Review of Systems Review of Systems  Skin:  Positive for rash.  All other systems reviewed and are negative.   Physical Exam Triage Vital Signs ED Triage Vitals [05/23/21 0859]  Enc Vitals Group     BP 132/81     Pulse Rate 74     Resp 14     Temp 98 F (36.7 C)     Temp Source Oral     SpO2 98 %     Weight      Height      Head Circumference      Peak Flow      Pain Score 2     Pain Loc      Pain Edu?      Excl. in GC?    No data found.  Updated Vital Signs BP 132/81 (BP Location: Left Arm)   Pulse 74   Temp 98 F (36.7 C) (Oral)   Resp 14   SpO2 98%   Visual Acuity Right Eye Distance:   Left Eye Distance:   Bilateral Distance:    Right Eye Near:   Left Eye Near:    Bilateral Near:     Physical Exam Vitals reviewed.  Constitutional:      Appearance: Normal appearance.  HENT:     Head: Normocephalic.  Cardiovascular:     Rate and Rhythm: Normal rate.  Pulmonary:     Effort: Pulmonary effort is normal.  Musculoskeletal:        General: Normal range of motion.     Cervical back: Normal range of motion and neck supple.  Skin:    General: Skin is warm and dry.     Findings: Rash present.     Comments: Erythematous rash noted to the bilateral upper and lower extremities  Neurological:     General: No focal deficit present.     Mental Status: He is alert and oriented to person, place, and time.  Psychiatric:        Mood and Affect: Mood normal.        Behavior: Behavior normal.     UC Treatments / Results  Labs (all labs ordered are  listed, but only abnormal results are displayed) Labs Reviewed - No data to display  EKG   Radiology No results found.  Procedures Procedures (including critical care time)  Medications Ordered in UC Medications - No data to display  Initial Impression / Assessment and Plan / UC Course  I have reviewed the triage vital signs and the nursing notes.  Pertinent labs & imaging results that were available during my care of the patient were reviewed by me and considered in my medical decision making (see chart for details).    25 yo male with poison ivy rash presenting for treatment. He is allergic to steroids. Will treat with antihistamines and supportive topical therapy.   Today's evaluation has revealed no signs of a dangerous process. Discussed diagnosis with patient and/or guardian. Patient and/or guardian aware of their diagnosis, possible red flag symptoms to watch out for and need for close follow up. Patient and/or guardian understands verbal and written discharge instructions. Patient and/or guardian comfortable with plan and disposition.  Patient and/or guardian has a clear mental status at this time, good insight into illness (after discussion and teaching) and has clear judgment to make decisions regarding their care  This care was provided during an unprecedented National Emergency due to the Novel Coronavirus (COVID-19) pandemic. COVID-19 infections and transmission risks place heavy strains on healthcare resources.  As this pandemic evolves, our facility, providers, and staff strive to respond fluidly, to remain operational, and to provide care relative to available resources and information. Outcomes are unpredictable and treatments are without well-defined guidelines. Further, the impact of COVID-19 on all aspects of urgent care, including the impact to patients seeking care for reasons other than COVID-19, is unavoidable during this national emergency. At this time of the global  pandemic, management of patients has significantly changed, even for non-COVID positive patients given high local and regional COVID volumes at this time requiring high healthcare system and resource utilization. The standard of care for management of both COVID suspected and non-COVID suspected patients continues to change rapidly at the local, regional, national, and global levels. This patient was worked up and treated to the best available but ever changing evidence and resources available at this current time.   Documentation was completed with the aid of voice recognition software. Transcription may contain typographical errors. Final Clinical Impressions(s) / UC Diagnoses   Final diagnoses:  Toxicodendron dermatitis  Pruritus     Discharge Instructions      The usual treatment for poison ivy rash is steroids. We can't do that because of your allergy.  I have prescribed you a mediation to take as needed for itching.  You can also try a product called Domeboro which is an over-the-counter soothing soak to treat poison ivy.  Do not scratch or rub your skin. Apply a cold, wet cloth (cold compress) to the affected areas or take baths in cool water. This will help with itching.  Avoid hot baths and showers. Take oatmeal baths as needed. Use colloidal oatmeal. You can get this at your local pharmacy or grocery store. Follow the instructions on the packaging. While you have the rash, wash clothes right after you wear them.     ED Prescriptions     Medication Sig Dispense Auth. Provider   hydrOXYzine (ATARAX/VISTARIL) 25 MG tablet Take 1 tablet (25 mg total) by mouth every 6 (six) hours as needed for itching. 12 tablet Lurline Idol, FNP   cetirizine (ZYRTEC) 10 MG tablet Take 1 tablet (10 mg total) by mouth daily. 14 tablet Lurline Idol, [place]  PDMP not reviewed this encounter.   Lurline Idol, [place] 05/23/21 1005

## 2021-05-23 NOTE — Discharge Instructions (Addendum)
The usual treatment for poison ivy rash is steroids. We can't do that because of your allergy.  I have prescribed you a mediation to take as needed for itching.  You can also try a product called Domeboro which is an over-the-counter soothing soak to treat poison ivy.  Do not scratch or rub your skin. Apply a cold, wet cloth (cold compress) to the affected areas or take baths in cool water. This will help with itching.  Avoid hot baths and showers. Take oatmeal baths as needed. Use colloidal oatmeal. You can get this at your local pharmacy or grocery store. Follow the instructions on the packaging. While you have the rash, wash clothes right after you wear them.

## 2021-05-23 NOTE — ED Triage Notes (Signed)
States he got poison ivy on his legs Sunday. Spreading due to wearing jeans at work in the heat. Says that the last time he got a prescribed prednisone it made him extremely angry and that he passed out from it.

## 2021-07-18 ENCOUNTER — Ambulatory Visit (HOSPITAL_COMMUNITY): Payer: Self-pay

## 2021-07-19 ENCOUNTER — Other Ambulatory Visit: Payer: Self-pay

## 2021-07-19 ENCOUNTER — Ambulatory Visit
Admission: RE | Admit: 2021-07-19 | Discharge: 2021-07-19 | Disposition: A | Discharge status: Home / Self Care | Payer: Medicaid Other | Source: Ambulatory Visit | Attending: Urgent Care | Admitting: Urgent Care

## 2021-07-19 VITALS — BP 179/92 | HR 72 | Temp 98.0°F | Ht 72.0 in | Wt 200.0 lb

## 2021-07-19 DIAGNOSIS — M79644 Pain in right finger(s): Secondary | ICD-10-CM

## 2021-07-19 MED ORDER — CEPHALEXIN 500 MG PO CAPS: 500.0000 mg | ORAL_CAPSULE | Freq: Three times a day (TID) | ORAL | 0 refills | Status: DC

## 2021-07-19 MED ORDER — NAPROXEN 500 MG PO TABS: 500.0000 mg | ORAL_TABLET | Freq: Two times a day (BID) | ORAL | 0 refills | Status: DC

## 2021-07-19 NOTE — ED Triage Notes (Signed)
Patient c/o that he was changing his wife's alternator and hit his right middle finger.  Finger is red and swollen around the nail bed.  Applying peroxide.

## 2021-07-19 NOTE — ED Provider Notes (Signed)
[facility]   MRN: [id number] DOB: 10/09/1996  Subjective:   Jaime Nixon is a 25 y.o. male presenting for concern for paronychia.  Patient states that he was working trying to change his wife's alternator and hit his right middle finger.  He has since had pain right next to the nail.  Has also had redness.  He is very active with his hands.  Has previously had a paronychia and does not wanted to get to that point.  Does not want an incision and drainage.  No current facility-administered medications for this encounter.  Current Outpatient Medications:    cetirizine (ZYRTEC) 10 MG tablet, Take 1 tablet (10 mg total) by mouth daily., Disp: 14 tablet, Rfl: 0   famotidine (PEPCID) 20 MG tablet, Take 1 tablet (20 mg total) by mouth 2 (two) times daily., Disp: 30 tablet, Rfl: 0   hydrOXYzine (ATARAX/VISTARIL) 25 MG tablet, Take 1 tablet (25 mg total) by mouth every 6 (six) hours as needed for itching., Disp: 12 tablet, Rfl: 0   naproxen (NAPROSYN) 375 MG tablet, Take 1 tablet (375 mg total) by mouth 2 (two) times daily with a meal., Disp: 30 tablet, Rfl: 0   nicotine polacrilex (NICORETTE) 2 MG gum, Take 1 each (2 mg total) by mouth as needed for smoking cessation. (Patient not taking: No sig reported), Disp: 100 tablet, Rfl: 0   ondansetron (ZOFRAN ODT) 4 MG disintegrating tablet, Take 1 tablet (4 mg total) by mouth every 8 (eight) hours as needed for nausea or vomiting., Disp: 20 tablet, Rfl: 0   ondansetron (ZOFRAN) 4 MG tablet, Take 1 tablet (4 mg total) by mouth every 6 (six) hours., Disp: 12 tablet, Rfl: 0   pantoprazole (PROTONIX) 20 MG tablet, Take 1 tablet (20 mg total) by mouth daily., Disp: 30 tablet, Rfl: 1   promethazine (PHENERGAN) 25 MG tablet, Take 1 tablet (25 mg total) by mouth every 6 (six) hours as needed for nausea or vomiting., Disp: 12 tablet, Rfl: 0   ranitidine (ZANTAC) 150 MG capsule, Take 1 capsule (150 mg total) by mouth daily. (Patient not taking: No sig  reported), Disp: 30 capsule, Rfl: 0   sucralfate (CARAFATE) 1 g tablet, Take 1 tablet (1 g total) by mouth 4 (four) times daily -  with meals and at bedtime., Disp: 30 tablet, Rfl: 0   traZODone (DESYREL) 50 MG tablet, Take 1 tablet (50 mg) at bedtime: For insomnia (Patient not taking: No sig reported), Disp: 30 tablet, Rfl: 0   Allergies  Allergen Reactions   Dexamethasone Shortness Of Breath and Other (See Comments)    Reaction:  Makes pt angry    Prednisone Other (See Comments)    Reaction:  Makes pt angry     Past Medical History:  Diagnosis Date   Current smoker    History of substance use    Seizures (HCC)      History reviewed. No pertinent surgical history.  Family History  Problem Relation Age of Onset   Seizures Father    Seizures Brother     Social History   Tobacco Use   Smoking status: Every Day    Packs/day: 1.00    Years: 10.00    Pack years: 10.00    Types: Cigarettes   Smokeless tobacco: Never  Substance Use Topics   Alcohol use: Not Currently    Comment: rare use, but binges occasionally   Drug use: Yes    Types: Marijuana    Comment: every  other day    ROS   Objective:   Vitals: BP (!) 179/92 (BP Location: Left Arm)   Pulse 72   Temp 98 F (36.7 C) (Oral)   Ht 6' (1.829 m)   Wt 200 lb (90.7 kg)   SpO2 95%   BMI 27.12 kg/m   Physical Exam Constitutional:      General: He is not in acute distress.    Appearance: Normal appearance. He is well-developed and normal weight. He is not ill-appearing, toxic-appearing or diaphoretic.  HENT:     Head: Normocephalic and atraumatic.     Right Ear: External ear normal.     Left Ear: External ear normal.     Nose: Nose normal.     Mouth/Throat:     Pharynx: Oropharynx is clear.  Eyes:     General: No scleral icterus.       Right eye: No discharge.        Left eye: No discharge.     Extraocular Movements: Extraocular movements intact.     Pupils: Pupils are equal, round, and reactive to  light.  Cardiovascular:     Rate and Rhythm: Normal rate.  Pulmonary:     Effort: Pulmonary effort is normal.  Musculoskeletal:       Hands:     Cervical back: Normal range of motion.  Neurological:     Mental Status: He is alert and oriented to person, place, and time.  Psychiatric:        Mood and Affect: Mood normal.        Behavior: Behavior normal.        Thought Content: Thought content normal.        Judgment: Judgment normal.    Assessment and Plan :   PDMP not reviewed this encounter.  1. Finger pain, right     I do not suspect paronychia at this time.  However, I obliged patient and provided him with a prescription for Keflex in the event that his symptoms worsen.  Emphasized need to practice good hand hygiene, use warm soaks and compresses.  Naproxen for pain and inflammation.  Start Keflex if there is no improvement and return to clinic for an evaluation/recheck if he develops signs of paronychia as discussed and demonstrated in clinic. Counseled patient on potential for adverse effects with medications prescribed/recommended today, ER and return-to-clinic precautions discussed, patient verbalized understanding.    Wallis Bamberg, [place] 07/19/21 1442

## 2021-07-19 NOTE — Discharge Instructions (Signed)
Use warm soaks with Dial antibacterial soap 3-5 times daily. Press on the finger to try and express any pus from the finger. If there is no improvement in 24 hours, then start Keflex 3 times daily. Use naproxen for pain and inflammation.

## 2021-12-01 IMAGING — CR DG FINGER THUMB 2+V*L*
4 series · 4 of 4 positions shown · non-contrast
Comparison: None.

CLINICAL DATA: Laceration with a tool.  Pain at the distal phalanx.

EXAM:
LEFT THUMB 2+V

[finger ap (1 of 2)]
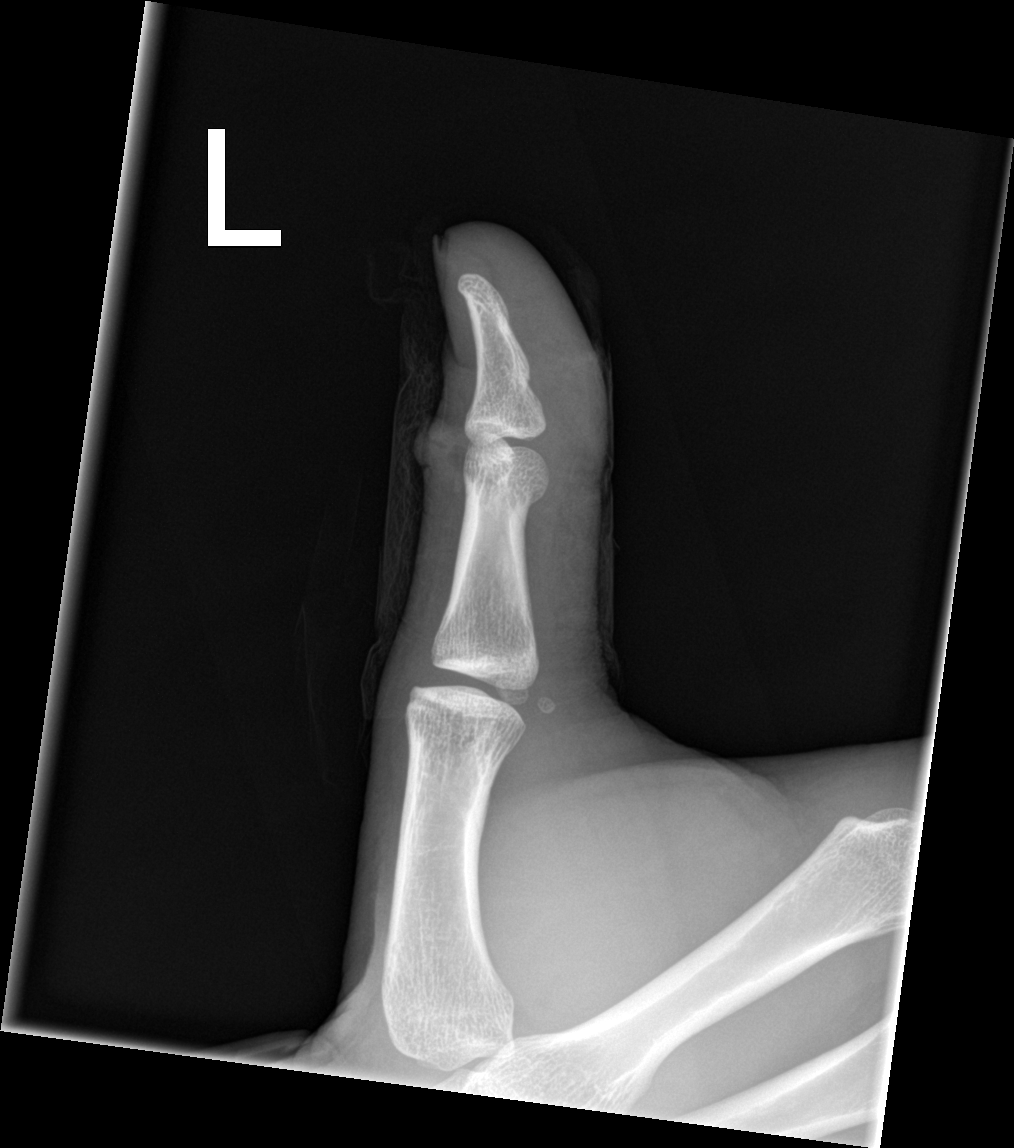

[finger obl]
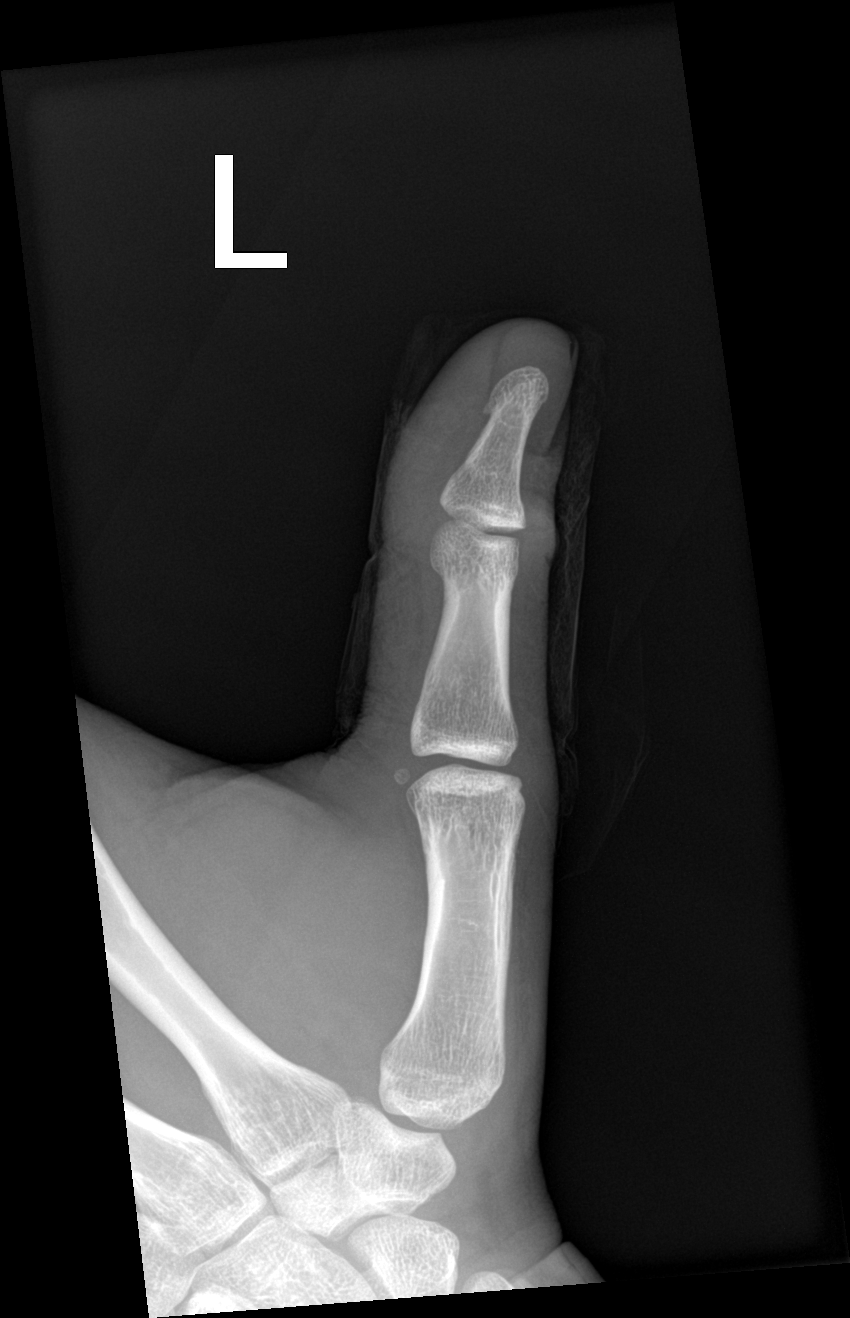

[finger lat]
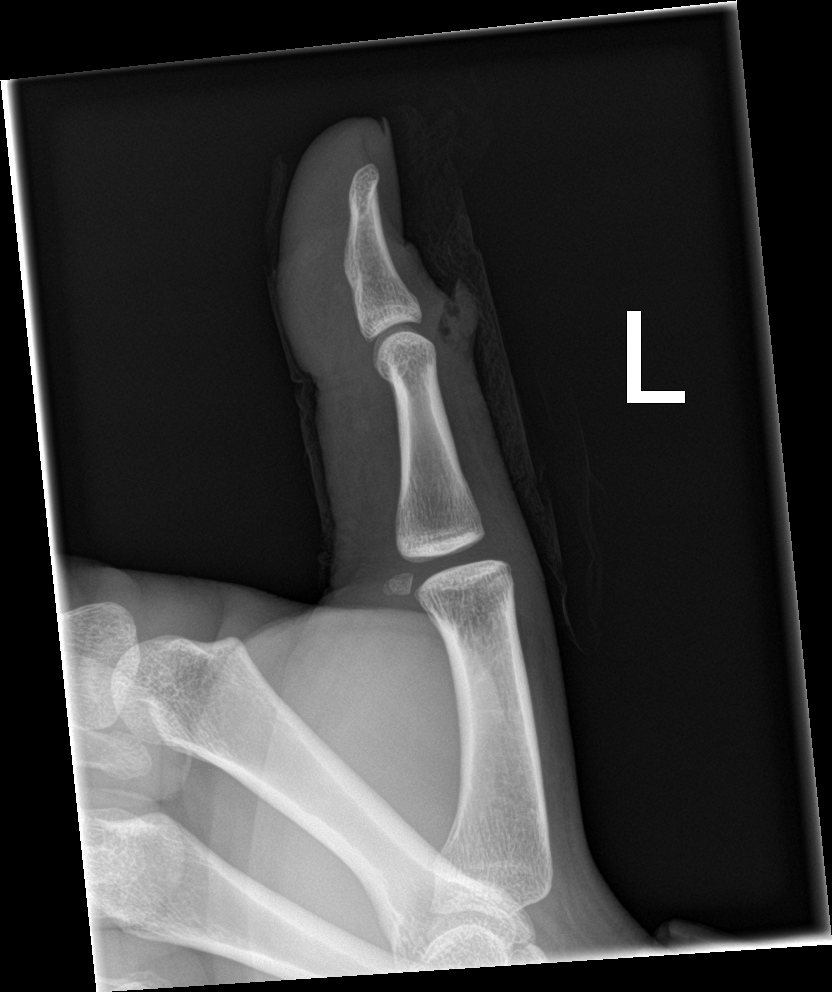

[finger ap (2 of 2)]
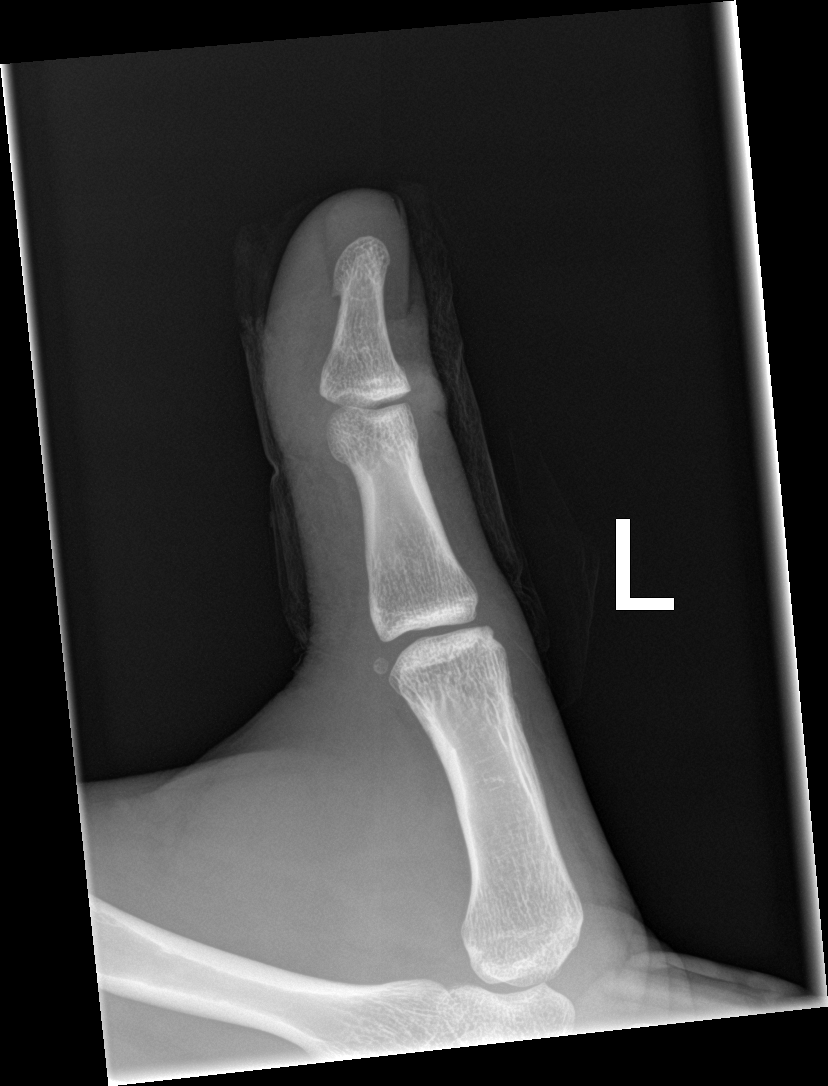

[4 of 4 positions shown; findings below may reference images not displayed]

FINDINGS: There is laceration of the soft tissues along the anterior aspect of
the thumb at the interphalangeal joint region. There is no acute
fracture or subluxation. No radiopaque foreign body.
IMPRESSION: Soft tissue laceration.  No acute fracture.

## 2022-07-07 ENCOUNTER — Encounter (HOSPITAL_COMMUNITY): Payer: Self-pay | Admitting: Emergency Medicine

## 2022-07-07 ENCOUNTER — Emergency Department (HOSPITAL_COMMUNITY)
Admission: EM | Admit: 2022-07-07 | Discharge: 2022-07-07 | Discharge status: Against Medical Advice | Payer: Medicaid Other | Attending: Emergency Medicine | Admitting: Emergency Medicine

## 2022-07-07 ENCOUNTER — Other Ambulatory Visit: Payer: Self-pay

## 2022-07-07 DIAGNOSIS — K0889 Other specified disorders of teeth and supporting structures: Secondary | ICD-10-CM | POA: Insufficient documentation

## 2022-07-07 DIAGNOSIS — R0981 Nasal congestion: Secondary | ICD-10-CM | POA: Insufficient documentation

## 2022-07-07 DIAGNOSIS — R0989 Other specified symptoms and signs involving the circulatory and respiratory systems: Secondary | ICD-10-CM | POA: Insufficient documentation

## 2022-07-07 DIAGNOSIS — Z5321 Procedure and treatment not carried out due to patient leaving prior to being seen by health care provider: Secondary | ICD-10-CM | POA: Insufficient documentation

## 2022-07-07 NOTE — ED Triage Notes (Signed)
Pt reports swelling and pain to the left side of his face.

## 2022-07-07 NOTE — ED Provider Triage Note (Signed)
Emergency Medicine Provider Triage Evaluation Note  Jaime Nixon , a 26 y.o. male  was evaluated in triage.  Pt complains of left facial pain.  States he has had some chronic dental pain.  He is also having some congestion and rhinorrhea.  Face hurts over his left mandible and document process no recent trauma or injury.  Feels like the size face is swollen.  No fever, eye pain, vision changes  Review of Systems  Positive: Facial pain Negative:   Physical Exam  BP (!) 148/83   Pulse 76   Temp 98.2 F (36.8 C) (Oral)   Resp 16   SpO2 99%  Gen:   Awake, no distress   Resp:  Normal effort  Face:  Tenderness over left maxillary sinus.  Some overall poor dentition, no obvious facial swelling, erythema or warmth MSK:   Moves extremities without difficulty  Other:    Medical Decision Making  Medically screening exam initiated at 9:51 PM.  Appropriate orders placed.  ARLES RUMBOLD was informed that the remainder of the evaluation will be completed by another provider, this initial triage assessment does not replace that evaluation, and the importance of remaining in the ED until their evaluation is complete.  Facial pain   Chalise Pe A, PA-C 07/07/22 2152

## 2022-07-07 NOTE — ED Notes (Signed)
Pt inquiring about wait times, states he has no pain and just wants an antibiotic. RN explained he was waiting for a room to see a provider. Pt states he was leaving and will go to an urgent care in the morning.

## 2022-07-08 ENCOUNTER — Encounter (HOSPITAL_COMMUNITY): Payer: Self-pay | Admitting: Emergency Medicine

## 2022-07-08 ENCOUNTER — Ambulatory Visit (HOSPITAL_COMMUNITY)
Admission: EM | Admit: 2022-07-08 | Discharge: 2022-07-08 | Disposition: A | Discharge status: Home / Self Care | Payer: Self-pay | Attending: Family Medicine | Admitting: Family Medicine

## 2022-07-08 DIAGNOSIS — K0889 Other specified disorders of teeth and supporting structures: Secondary | ICD-10-CM

## 2022-07-08 MED ORDER — AMOXICILLIN 875 MG PO TABS: 875.0000 mg | ORAL_TABLET | Freq: Two times a day (BID) | ORAL | 0 refills | Status: AC

## 2022-07-08 NOTE — ED Triage Notes (Signed)
Pt reports left side dental pain and swelling on the left side of face. States pain and swelling began Thursday. Has been using a heating pad for relief.

## 2022-07-10 NOTE — ED Provider Notes (Signed)
  [facility]   [id number] 07/08/22 Arrival Time: 5277  ASSESSMENT & PLAN:  1. Pain, dental    No sign of abscess requiring I&D at this time. Discussed.  Meds ordered this encounter  Medications   amoxicillin (AMOXIL) 875 MG tablet    Sig: Take 1 tablet (875 mg total) by mouth 2 (two) times daily for 10 days.    Dispense:  20 tablet    Refill:  0    Dental resource written instructions given. He will schedule dental evaluation as soon as possible if not improving over the next 24-48 hours.  Reviewed expectations re: course of current medical issues. Questions answered. Outlined signs and symptoms indicating need for more acute intervention. Patient verbalized understanding. After Visit Summary given.   SUBJECTIVE:  Jaime Nixon is a 26 y.o. male who reports L upper molar tooth pain described as aching. Present for few days. Fever: absent. Tolerating PO intake but reports pain with chewing. Normal swallowing. He does not see a dentist regularly. No neck swelling or pain. OTC analgesics without relief.   OBJECTIVE: Vitals:   07/08/22 0859 07/08/22 0900  BP:  128/88  Pulse:  82  Resp:  17  Temp:  97.9 F (36.6 C)  TempSrc:  Oral  SpO2: 98% 97%    General appearance: alert; no distress HENT: normocephalic; atraumatic; dentition: poor; left upper gum around rear molars without areas of fluctuance, drainage, or bleeding and with tenderness to palpation; normal jaw movement without difficulty Neck: supple without LAD; FROM; trachea midline Lungs: normal respirations; unlabored; speaks full sentences without difficulty Skin: warm and dry Psychological: alert and cooperative; normal mood and affect  Allergies  Allergen Reactions   Dexamethasone Shortness Of Breath and Other (See Comments)    Reaction:  Makes pt angry    Prednisone Other (See Comments)    Reaction:  Makes pt angry     Past Medical History:  Diagnosis Date   Current smoker    History of  substance use    Seizures (HCC)    Social History   Socioeconomic History   Marital status: Single    Spouse name: Not on file   Number of children: Not on file   Years of education: Not on file   Highest education level: Not on file  Occupational History   Occupation: landscaper  Tobacco Use   Smoking status: Every Day    Packs/day: 1.00    Years: 10.00    Total pack years: 10.00    Types: Cigarettes   Smokeless tobacco: Never  Substance and Sexual Activity   Alcohol use: Not Currently    Comment: rare use, but binges occasionally   Drug use: Yes    Types: Marijuana    Comment: every other day   Sexual activity: Not on file  Other Topics Concern   Not on file  Social History Narrative   Not on file   Social Determinants of Health   Financial Resource Strain: Not on file  Food Insecurity: Not on file  Transportation Needs: Not on file  Physical Activity: Not on file  Stress: Not on file  Social Connections: Not on file  Intimate Partner Violence: Not on file   Family History  Problem Relation Age of Onset   Seizures Father    Seizures Brother    History reviewed. No pertinent surgical history.    Mardella Layman, MD 07/10/22 1344

## 2022-07-20 ENCOUNTER — Encounter (HOSPITAL_COMMUNITY): Payer: Self-pay

## 2022-07-20 ENCOUNTER — Ambulatory Visit (HOSPITAL_COMMUNITY)
Admission: RE | Admit: 2022-07-20 | Discharge: 2022-07-20 | Disposition: A | Discharge status: Home / Self Care | Payer: Medicaid Other | Source: Ambulatory Visit | Attending: Physician Assistant | Admitting: Physician Assistant

## 2022-07-20 ENCOUNTER — Other Ambulatory Visit: Payer: Self-pay

## 2022-07-20 VITALS — BP 128/79 | HR 71 | Temp 98.3°F | Resp 18

## 2022-07-20 DIAGNOSIS — R220 Localized swelling, mass and lump, head: Secondary | ICD-10-CM

## 2022-07-20 DIAGNOSIS — K047 Periapical abscess without sinus: Secondary | ICD-10-CM

## 2022-07-20 MED ORDER — CLINDAMYCIN HCL 300 MG PO CAPS: 300.0000 mg | ORAL_CAPSULE | Freq: Three times a day (TID) | ORAL | 0 refills | Status: DC

## 2022-07-20 NOTE — Discharge Instructions (Signed)
Take antibiotic as prescribed.  This can upset your stomach if develop any severe diarrhea please stop the medication and be seen immediately.  Use over-the-counter medications for additional symptom relief.  Follow-up with dentist as scheduled.  If you have any worsening symptoms including swelling of your throat, nausea/vomiting, fever, shortness of breath, trouble swallowing, trouble speaking you need to go to the emergency room immediately.

## 2022-07-20 NOTE — ED Triage Notes (Signed)
Pt reports facial swelling obn Lt and upper lip. Pt has a broken tooth on the Lt. Pt reports he has a dental appt OCT. 5 and was hoping the MD would give him an Ant-bx shot. Pt just completed Oral andti-bx.

## 2022-07-20 NOTE — ED Provider Notes (Signed)
[facility]    CSN: [id number] Arrival date & time: 07/20/22  1155      History   Chief Complaint Chief Complaint  Patient presents with   Facial Pain    Entered by patient   Facial Swelling    HPI Jaime Nixon is a 26 y.o. male.   Patient presents today with a several day history of recurrent dental pain and facial swelling.  He was last seen by clinic on 07/08/2022 at which point he was started on Augmentin.  Reports that symptoms did improve with this medication regimen until recurrence within the past few days.  It started as a mild to peak but has progressively worsened and he is now experiencing facial swelling.  Denies any recent dental procedure.  He is scheduled to see a dentist but not until October 5.  Reports pain is rated 8 on a 0-10 pain scale, described as throbbing, no aggravating relieving factors identified.  He is able to eat and drink despite symptoms though mastication is painful.  He denies any swelling of his throat, muffled voice, shortness of breath, dysphagia.    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 No  pertinent surgical history.     Home Medications    Prior to Admission medications   Medication Sig Start Date End Date Taking? Authorizing Provider  clindamycin (CLEOCIN) 300 MG capsule Take 1 capsule (300 mg total) by mouth 3 (three) times daily for 10 days. 07/20/22 07/30/22 Yes Arlisa Leclere, Noberto Retort, PA-C  cetirizine (ZYRTEC) 10 MG tablet Take 1 tablet (10 mg total) by mouth daily. 05/23/21   Lurline Idol, FNP  famotidine (PEPCID) 20 MG tablet Take 1 tablet (20 mg total) by mouth 2 (two) times daily. 12/31/20   Khatri, Hina, PA-C  hydrOXYzine (ATARAX/VISTARIL) 25 MG tablet Take 1 tablet (25 mg total) by mouth every 6 (six) hours as needed for itching. 05/23/21   Lurline Idol, FNP  naproxen (NAPROSYN) 500 MG tablet Take 1 tablet (500 mg total) by mouth 2 (two) times daily with a meal. 07/19/21   Wallis Bamberg, PA-C  nicotine polacrilex (NICORETTE) 2 MG gum Take 1 each (2 mg total) by mouth as needed for smoking cessation. Patient not taking: No sig reported 09/20/16   Armandina Stammer I, NP  ondansetron (ZOFRAN ODT) 4 MG disintegrating tablet Take 1 tablet (4 mg total) by mouth every 8 (eight) hours as needed for nausea or vomiting. 05/29/20   Alvira Monday, MD  ondansetron (ZOFRAN) 4 MG tablet Take 1 tablet (4 mg total) by mouth every 6 (six) hours. 02/07/20  Moshe Cipro, NP  pantoprazole (PROTONIX) 20 MG tablet Take 1 tablet (20 mg total) by mouth daily. 02/07/20   Moshe Cipro, NP  promethazine (PHENERGAN) 25 MG tablet Take 1 tablet (25 mg total) by mouth every 6 (six) hours as needed for nausea or vomiting. 12/13/18   Arthor Captain, PA-C  ranitidine (ZANTAC) 150 MG capsule Take 1 capsule (150 mg total) by mouth daily. Patient not taking: No sig reported 05/25/18   Hedges, Tinnie Gens, PA-C  sucralfate (CARAFATE) 1 g tablet Take 1 tablet (1 g total) by mouth 4 (four) times daily -  with meals and at bedtime. 02/07/20   Moshe Cipro, NP  traZODone (DESYREL) 50 MG tablet Take 1  tablet (50 mg) at bedtime: For insomnia Patient not taking: No sig reported 09/20/16   Armandina Stammer I, NP  FLUoxetine (PROZAC) 20 MG capsule Take 1 capsule (20 mg total) by mouth daily. For depression Patient not taking: Reported on 02/24/2017 09/21/16 02/07/20  Armandina Stammer I, NP  levETIRAcetam (KEPPRA) 500 MG tablet Take 1 tablet (500 mg total) by mouth 2 (two) times daily. 12/13/18 02/07/20  Arthor Captain, PA-C    Family History Family History  Problem Relation Age of Onset   Seizures Father    Seizures Brother     Social History Social History   Tobacco Use   Smoking status: Every Day    Packs/day: 1.00    Years: 10.00    Total pack years: 10.00    Types: Cigarettes   Smokeless tobacco: Never  Substance Use Topics   Alcohol use: Not Currently    Comment: rare use, but binges occasionally   Drug use: Yes    Types: Marijuana    Comment: every other day     Allergies   Dexamethasone and Prednisone   Review of Systems Review of Systems  Constitutional:  Positive for activity change. Negative for appetite change, fatigue and fever.  HENT:  Positive for dental problem and facial swelling. Negative for congestion, sinus pressure, sneezing, sore throat, trouble swallowing and voice change.   Respiratory:  Negative for cough and shortness of breath.   Cardiovascular:  Negative for chest pain.  Gastrointestinal:  Negative for abdominal pain, diarrhea, nausea and vomiting.  Neurological:  Negative for dizziness, light-headedness and headaches.     Physical Exam Triage Vital Signs ED Triage Vitals  Enc Vitals Group     BP 07/20/22 1333 128/79     Pulse Rate 07/20/22 1333 71     Resp 07/20/22 1333 18     Temp 07/20/22 1333 98.3 F (36.8 C)     Temp src --      SpO2 07/20/22 1333 97 %     Weight --      Height --      Head Circumference --      Peak Flow --      Pain Score 07/20/22 1330 8     Pain Loc --      Pain Edu? --      Excl. in GC? --    No data  found.  Updated Vital Signs BP 128/79   Pulse 71   Temp 98.3 F (36.8 C)   Resp 18   SpO2 97%   Visual Acuity Right Eye Distance:   Left Eye Distance:   Bilateral Distance:    Right Eye Near:   Left Eye Near:    Bilateral Near:     Physical Exam Vitals reviewed.  Constitutional:  General: He is awake.     Appearance: Normal appearance. He is well-developed. He is not ill-appearing.     Comments: Very pleasant male appears stated age in no acute distress sitting comfortably in exam room  HENT:     Head: Normocephalic and atraumatic.     Right Ear: Tympanic membrane, ear canal and external ear normal. Tympanic membrane is not erythematous or bulging.     Left Ear: Tympanic membrane, ear canal and external ear normal. Tympanic membrane is not erythematous or bulging.     Nose: Nose normal.     Mouth/Throat:     Dentition: Abnormal dentition. Dental tenderness and gingival swelling present. No dental abscesses.     Pharynx: Uvula midline. No oropharyngeal exudate or posterior oropharyngeal erythema.      Comments: No evidence of Ludwig angina Cardiovascular:     Rate and Rhythm: Normal rate and regular rhythm.     Heart sounds: Normal heart sounds, S1 normal and S2 normal. No murmur heard. Pulmonary:     Effort: Pulmonary effort is normal. No accessory muscle usage or respiratory distress.     Breath sounds: Normal breath sounds. No stridor. No wheezing, rhonchi or rales.     Comments: Clear to auscultation bilaterally Abdominal:     Palpations: Abdomen is soft.     Tenderness: There is no abdominal tenderness.  Neurological:     Mental Status: He is alert.  Psychiatric:        Behavior: Behavior is cooperative.      UC Treatments / Results  Labs (all labs ordered are listed, but only abnormal results are displayed) Labs Reviewed - No data to display  EKG   Radiology No results found.  Procedures Procedures (including critical care time)  Medications  Ordered in UC Medications - No data to display  Initial Impression / Assessment and Plan / UC Course  I have reviewed the triage vital signs and the nursing notes.  Pertinent labs & imaging results that were available during my care of the patient were reviewed by me and considered in my medical decision making (see chart for details).     Patient is well-appearing, afebrile, nontoxic, nontachycardic.  Given recurrent dental infection will cover with clindamycin since he is recently taking amoxicillin.  He was encouraged to use warm salt water gargles and over-the-counter medications for additional symptom relief.  Discussed the importance of following up with a dentist as he would likely have recurrent issues until underlying tooth is addressed.  He has an appointment 08/01/2022 and was really encouraged to keep this appointment.  Discussed that if he has any worsening symptoms including swelling of his throat, muffled voice, shortness of breath, dysphagia, fever, nausea/vomiting he needs to be seen immediately.  Strict return precautions given.  Patient declined work excuse note.  Final Clinical Impressions(s) / UC Diagnoses   Final diagnoses:  Dental infection  Facial swelling     Discharge Instructions      Take antibiotic as prescribed.  This can upset your stomach if develop any severe diarrhea please stop the medication and be seen immediately.  Use over-the-counter medications for additional symptom relief.  Follow-up with dentist as scheduled.  If you have any worsening symptoms including swelling of your throat, nausea/vomiting, fever, shortness of breath, trouble swallowing, trouble speaking you need to go to the emergency room immediately.     ED Prescriptions     Medication Sig Dispense Auth. Provider   clindamycin (CLEOCIN) 300 MG capsule  Take 1 capsule (300 mg total) by mouth 3 (three) times daily for 10 days. 30 capsule Fany Cavanaugh K, PA-C      PDMP not reviewed  this encounter.   Jeani Hawking, PA-C 07/20/22 1356

## 2022-07-21 ENCOUNTER — Encounter (HOSPITAL_COMMUNITY): Payer: Self-pay

## 2022-07-21 ENCOUNTER — Emergency Department (HOSPITAL_COMMUNITY): Payer: Medicaid Other

## 2022-07-21 ENCOUNTER — Other Ambulatory Visit: Payer: Self-pay

## 2022-07-21 ENCOUNTER — Observation Stay (HOSPITAL_COMMUNITY)
Admission: EM | Admit: 2022-07-21 | Discharge: 2022-07-22 | Disposition: A | Discharge status: Home / Self Care | Payer: Medicaid Other | Attending: Internal Medicine | Admitting: Internal Medicine

## 2022-07-21 DIAGNOSIS — F1990 Other psychoactive substance use, unspecified, uncomplicated: Secondary | ICD-10-CM

## 2022-07-21 DIAGNOSIS — G40909 Epilepsy, unspecified, not intractable, without status epilepticus: Secondary | ICD-10-CM

## 2022-07-21 DIAGNOSIS — F17200 Nicotine dependence, unspecified, uncomplicated: Secondary | ICD-10-CM | POA: Diagnosis present

## 2022-07-21 DIAGNOSIS — K047 Periapical abscess without sinus: Principal | ICD-10-CM | POA: Diagnosis present

## 2022-07-21 DIAGNOSIS — L03211 Cellulitis of face: Secondary | ICD-10-CM

## 2022-07-21 DIAGNOSIS — F17210 Nicotine dependence, cigarettes, uncomplicated: Secondary | ICD-10-CM | POA: Insufficient documentation

## 2022-07-21 LAB — CBC WITH DIFFERENTIAL/PLATELET
Abs Immature Granulocytes: 0.12 10*3/uL — ABNORMAL HIGH (ref 0.00–0.07)
Basophils Absolute: 0.1 10*3/uL (ref 0.0–0.1)
Basophils Relative: 0 %
Eosinophils Absolute: 0.3 10*3/uL (ref 0.0–0.5)
Eosinophils Relative: 1 %
HCT: 47.6 % (ref 39.0–52.0)
Hemoglobin: 16.4 g/dL (ref 13.0–17.0)
Immature Granulocytes: 1 %
Lymphocytes Relative: 10 %
Lymphs Abs: 2 10*3/uL (ref 0.7–4.0)
MCH: 31.2 pg (ref 26.0–34.0)
MCHC: 34.5 g/dL (ref 30.0–36.0)
MCV: 90.7 fL (ref 80.0–100.0)
Monocytes Absolute: 1.8 10*3/uL — ABNORMAL HIGH (ref 0.1–1.0)
Monocytes Relative: 9 %
Neutro Abs: 15.2 10*3/uL — ABNORMAL HIGH (ref 1.7–7.7)
Neutrophils Relative %: 79 %
Platelets: 279 10*3/uL (ref 150–400)
RBC: 5.25 MIL/uL (ref 4.22–5.81)
RDW: 13 % (ref 11.5–15.5)
WBC: 19.4 10*3/uL — ABNORMAL HIGH (ref 4.0–10.5)
nRBC: 0 % (ref 0.0–0.2)

## 2022-07-21 LAB — BASIC METABOLIC PANEL
Anion gap: 13 (ref 5–15)
BUN: 13 mg/dL (ref 6–20)
CO2: 21 mmol/L — ABNORMAL LOW (ref 22–32)
Calcium: 9.8 mg/dL (ref 8.9–10.3)
Chloride: 106 mmol/L (ref 98–111)
Creatinine, Ser: 0.94 mg/dL (ref 0.61–1.24)
GFR, Estimated: >60 mL/min (ref >60)
Glucose, Bld: 113 mg/dL — ABNORMAL HIGH (ref 70–99)
Potassium: 4.1 mmol/L (ref 3.5–5.1)
Sodium: 140 mmol/L (ref 135–145)

## 2022-07-21 LAB — HEPATIC FUNCTION PANEL
ALT: 9 U/L (ref 0–44)
AST: 22 U/L (ref 15–41)
Albumin: 4.6 g/dL (ref 3.5–5.0)
Alkaline Phosphatase: 71 U/L (ref 38–126)
Bilirubin, Direct: 0.2 mg/dL (ref 0.0–0.2)
Indirect Bilirubin: 0.3 mg/dL (ref 0.3–0.9)
Total Bilirubin: 0.5 mg/dL (ref 0.3–1.2)
Total Protein: 8 g/dL (ref 6.5–8.1)

## 2022-07-21 LAB — ACETAMINOPHEN LEVEL: Acetaminophen (Tylenol), Serum: <10 ug/mL — ABNORMAL LOW (ref 10–30)

## 2022-07-21 MED ORDER — BENZOCAINE 20 % MT AERO
INHALATION_SPRAY | Freq: Once | OROMUCOSAL | Status: AC
  Filled 2022-07-21: qty 57

## 2022-07-21 MED ORDER — NICOTINE 21 MG/24HR TD PT24
21.0000 mg | MEDICATED_PATCH | Freq: Once | TRANSDERMAL | Status: AC
Start: 2022-07-21 — End: 2022-07-22
  Administered 2022-07-21: 21 mg via TRANSDERMAL
  Filled 2022-07-21: qty 1

## 2022-07-21 MED ORDER — ENOXAPARIN SODIUM 40 MG/0.4ML IJ SOSY
40.0000 mg | PREFILLED_SYRINGE | INTRAMUSCULAR | Status: DC
  Administered 2022-07-21: 40 mg via SUBCUTANEOUS
  Filled 2022-07-21: qty 0.4

## 2022-07-21 MED ORDER — LIDOCAINE HCL (PF) 1 % IJ SOLN: 5.0000 mL | Freq: Once | INTRAMUSCULAR | Status: DC

## 2022-07-21 MED ORDER — SODIUM CHLORIDE 0.9 % IV SOLN
3.0000 g | Freq: Four times a day (QID) | INTRAVENOUS | Status: DC
  Administered 2022-07-21 – 2022-07-22 (×4): 3 g via INTRAVENOUS
  Filled 2022-07-21 (×4): qty 8

## 2022-07-21 MED ORDER — POLYETHYLENE GLYCOL 3350 17 G PO PACK
17.0000 g | PACK | Freq: Every day | ORAL | Status: DC | PRN
  Filled 2022-07-21: qty 1

## 2022-07-21 MED ORDER — ORAL CARE MOUTH RINSE: 15.0000 mL | OROMUCOSAL | Status: DC | PRN

## 2022-07-21 MED ORDER — OXYCODONE HCL 5 MG PO TABS
5.0000 mg | ORAL_TABLET | ORAL | Status: DC | PRN
  Administered 2022-07-21 – 2022-07-22 (×5): 5 mg via ORAL
  Filled 2022-07-21 (×5): qty 1

## 2022-07-21 MED ORDER — SODIUM CHLORIDE 0.9 % IV SOLN
3.0000 g | Freq: Once | INTRAVENOUS | Status: AC
  Administered 2022-07-21: 3 g via INTRAVENOUS
  Filled 2022-07-21: qty 8

## 2022-07-21 MED ORDER — KETOROLAC TROMETHAMINE 15 MG/ML IJ SOLN
15.0000 mg | Freq: Once | INTRAMUSCULAR | Status: AC
  Administered 2022-07-21: 15 mg via INTRAVENOUS
  Filled 2022-07-21: qty 1

## 2022-07-21 MED ORDER — IOHEXOL 350 MG/ML SOLN
75.0000 mL | Freq: Once | INTRAVENOUS | Status: AC | PRN
  Administered 2022-07-21: 75 mL via INTRAVENOUS

## 2022-07-21 MED ORDER — MORPHINE SULFATE (PF) 4 MG/ML IV SOLN
4.0000 mg | Freq: Once | INTRAVENOUS | Status: AC
Start: 2022-07-21 — End: 2022-07-21
  Administered 2022-07-21: 4 mg via INTRAVENOUS
  Filled 2022-07-21: qty 1

## 2022-07-21 MED ORDER — ONDANSETRON HCL 4 MG PO TABS: 4.0000 mg | ORAL_TABLET | Freq: Once | ORAL | Status: DC

## 2022-07-21 MED ORDER — ACETAMINOPHEN 650 MG RE SUPP: 650.0000 mg | Freq: Four times a day (QID) | RECTAL | Status: DC | PRN

## 2022-07-21 MED ORDER — ONDANSETRON HCL 4 MG PO TABS: 4.0000 mg | ORAL_TABLET | Freq: Three times a day (TID) | ORAL | Status: DC | PRN

## 2022-07-21 MED ORDER — ACETAMINOPHEN 325 MG PO TABS
650.0000 mg | ORAL_TABLET | Freq: Four times a day (QID) | ORAL | Status: DC | PRN
  Administered 2022-07-21 (×2): 650 mg via ORAL
  Filled 2022-07-21 (×2): qty 2

## 2022-07-21 MED ORDER — HYDROMORPHONE HCL 1 MG/ML IJ SOLN
0.5000 mg | Freq: Three times a day (TID) | INTRAMUSCULAR | Status: DC | PRN
  Administered 2022-07-21: 0.5 mg via INTRAVENOUS
  Filled 2022-07-21: qty 0.5

## 2022-07-21 MED ORDER — OXYCODONE HCL 5 MG PO TABS: 5.0000 mg | ORAL_TABLET | Freq: Four times a day (QID) | ORAL | Status: DC | PRN

## 2022-07-21 NOTE — Progress Notes (Signed)
Pharmacy Antibiotic Note  Jaime Nixon is a 26 y.o. male for which pharmacy has been consulted for unasyn dosing for  tooth infection .  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 Patient presenting with facial swelling.  Estimated Creatinine Clearance: 128.4 mL/min (by C-G formula based on SCr of 0.94 mg/dL).  WBC 19.4; T afeb; HR 89; RR 16  Plan: Unasyn 3g q6h Trend WBC, Fever, Renal function F/u cultures, clinical course, WBC, fever De-escalate when able     Temp (24hrs), Avg:97.8 F (36.6 C), Min:97.5 F (36.4 C), Max:98.3 F (36.8 C)  Recent Labs  Lab 07/21/22 0746  WBC 19.4*  CREATININE 0.94    Estimated Creatinine Clearance: 128.4 mL/min (by C-G formula based on SCr of 0.94 mg/dL).    Allergies  Allergen Reactions   Dexamethasone Shortness Of Breath and Other (See Comments)    Reaction:  Makes pt angry    Prednisone Other (See Comments)    Reaction:  Makes pt angry     Antimicrobials this admission: unasyn 9/24 >>   Microbiology results: Pending  Thank you for allowing pharmacy to be a part of this patient's care.  Lorelei Pont, PharmD, BCPS 07/21/2022 12:23 PM ED Clinical Pharmacist -  [phone]

## 2022-07-21 NOTE — ED Triage Notes (Signed)
Patient complains of increased facial swelling and left swelling for several days. Has been seen at [facility] for same and has had 2 different antibiotics. Reports increased pain and swelling

## 2022-07-21 NOTE — Care Management (Signed)
Recommended PCP in patient instructions. Patient is uninsured, but is employed.  Does not currently have a PCP

## 2022-07-21 NOTE — ED Provider Notes (Signed)
[facility]  CSN: [id number] Arrival date & time: 07/21/22 [id number]  Chief Complaint(s) Facial Swelling  HPI Jaime Nixon is a 26 y.o. male with history of seizure disorder on Keppra, depression presenting with facial swelling.  Patient reports around 2 weeks of left facial swelling and left tooth pain.  He reports the tooth that hurts is his left upper canine tooth.  He reports that he took a course of amoxicillin which improved his symptoms but since finishing his symptoms have worsened.  He reports swelling for the past few days.  Denies any trouble swallowing, trouble breathing, fevers or chills.  Reports associated pain.  Pain is moderate.  He does have a dental appointment on 08/01/2022.   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   Panic attacks 09/15/2016   Bradycardia    OD (overdose of drug), intentional self-harm, initial encounter (HCC) 09/10/2016   Bradycardia, drug induced 09/10/2016   Hypotension due to drugs 09/10/2016   Overdose 09/10/2016   Generalized abdominal pain    Hyperkalemia    Uncontrollable vomiting    Seizure disorder (HCC) 05/26/2016   Cannabinoid hyperemesis syndrome 05/26/2016   Hypokalemia due to loss of potassium 05/26/2016   Tobacco dependence 05/26/2016   Nausea and vomiting 05/26/2016   Leukocytosis 12/27/2015   Abnormal finding on MRI of brain    Pachymeningitis    Home Medication(s) Prior to Admission medications   Medication Sig Start Date End Date Taking? Authorizing Provider  cetirizine (ZYRTEC) 10 MG tablet Take 1 tablet (10 mg total) by mouth daily. 05/23/21   Lurline IdolMurrill, Samantha, FNP  clindamycin (CLEOCIN)  300 MG capsule Take 1 capsule (300 mg total) by mouth 3 (three) times daily for 10 days. 07/20/22 07/30/22  Raspet, Noberto RetortErin K, PA-C  famotidine (PEPCID) 20 MG tablet Take 1 tablet (20 mg total) by mouth 2 (two) times daily. 12/31/20   Khatri, Hina, PA-C  hydrOXYzine (ATARAX/VISTARIL) 25 MG tablet Take 1 tablet (25 mg total) by mouth every 6 (six) hours as needed for itching. 05/23/21   Lurline IdolMurrill, Samantha, FNP  naproxen (NAPROSYN) 500 MG tablet Take 1 tablet (500 mg total) by mouth 2 (two) times daily with a meal. 07/19/21   Wallis BambergMani, Mario, PA-C  nicotine polacrilex (NICORETTE) 2 MG gum Take 1 each (2 mg total) by mouth as needed for smoking cessation. Patient not taking: No sig reported 09/20/16   Armandina StammerNwoko, Agnes I, NP  ondansetron (ZOFRAN ODT) 4 MG disintegrating tablet Take 1 tablet (4 mg total) by mouth every 8 (eight) hours as needed for nausea or vomiting. 05/29/20   Alvira MondaySchlossman, Erin, MD  ondansetron (ZOFRAN) 4 MG tablet Take 1 tablet (4 mg total) by mouth every 6 (six) hours. 02/07/20   Moshe CiproMatthews, Stephanie, NP  pantoprazole (PROTONIX) 20 MG tablet Take 1 tablet (20 mg total) by mouth daily. 02/07/20   Moshe CiproMatthews, Stephanie, NP  promethazine (PHENERGAN) 25 MG tablet Take 1 tablet (25 mg total) by mouth every 6 (six) hours as needed for nausea or vomiting. 12/13/18   Arthor CaptainHarris, Abigail, PA-C  ranitidine (ZANTAC) 150 MG capsule Take 1 capsule (150 mg total) by mouth daily. Patient not taking: No  sig reported 05/25/18   Hedges, Tinnie Gens, PA-C  sucralfate (CARAFATE) 1 g tablet Take 1 tablet (1 g total) by mouth 4 (four) times daily -  with meals and at bedtime. 02/07/20   Moshe Cipro, NP  traZODone (DESYREL) 50 MG tablet Take 1 tablet (50 mg) at bedtime: For insomnia Patient not taking: No sig reported 09/20/16   Armandina Stammer I, NP  FLUoxetine (PROZAC) 20 MG capsule Take 1 capsule (20 mg total) by mouth daily. For depression Patient not taking: Reported on 02/24/2017 09/21/16 02/07/20  Armandina Stammer I, NP  levETIRAcetam  (KEPPRA) 500 MG tablet Take 1 tablet (500 mg total) by mouth 2 (two) times daily. 12/13/18 02/07/20  Arthor Captain, PA-C                                                                                                                                    Past Surgical History History reviewed. No pertinent surgical history. Family History Family History  Problem Relation Age of Onset   Seizures Father    Seizures Brother     Social History Social History   Tobacco Use   Smoking status: Every Day    Packs/day: 1.00    Years: 10.00    Total pack years: 10.00    Types: Cigarettes   Smokeless tobacco: Never  Substance Use Topics   Alcohol use: Not Currently    Comment: rare use, but binges occasionally   Drug use: Yes    Types: Marijuana    Comment: every other day   Allergies Dexamethasone and Prednisone  Review of Systems Review of Systems  All other systems reviewed and are negative.   Physical Exam Vital Signs  I have reviewed the triage vital signs BP 125/72   Pulse 89   Temp 97.6 F (36.4 C) (Oral)   Resp 16   SpO2 98%  Physical Exam Vitals and nursing note reviewed.  Constitutional:      General: He is not in acute distress.    Appearance: Normal appearance.  HENT:     Head:     Comments: Mild swelling of the left face.     Mouth/Throat:     Mouth: Mucous membranes are moist.     Comments: Tenderness to percussion of the left upper canine tooth, possible very small periapical abscess. Floor of mouth soft.  Eyes:     Conjunctiva/sclera: Conjunctivae normal.  Cardiovascular:     Rate and Rhythm: Normal rate and regular rhythm.  Pulmonary:     Effort: Pulmonary effort is normal. No respiratory distress.     Breath sounds: Normal breath sounds.  Abdominal:     General: Abdomen is flat.     Palpations: Abdomen is soft.     Tenderness: There is no abdominal tenderness.  Musculoskeletal:     Right lower leg: No edema.     Left lower leg: No edema.   Skin:  General: Skin is warm and dry.     Capillary Refill: Capillary refill takes less than 2 seconds.  Neurological:     Mental Status: He is alert and oriented to person, place, and time. Mental status is at baseline.  Psychiatric:        Mood and Affect: Mood normal.        Behavior: Behavior normal.     ED Results and Treatments Labs (all labs ordered are listed, but only abnormal results are displayed) Labs Reviewed  BASIC METABOLIC PANEL - Abnormal; Notable for the following components:      Result Value   CO2 21 (*)    Glucose, Bld 113 (*)    All other components within normal limits  CBC WITH DIFFERENTIAL/PLATELET - Abnormal; Notable for the following components:   WBC 19.4 (*)    Neutro Abs 15.2 (*)    Monocytes Absolute 1.8 (*)    Abs Immature Granulocytes 0.12 (*)    All other components within normal limits  HIV ANTIBODY (ROUTINE TESTING W REFLEX)                                                                                                                          Radiology CT Maxillofacial W Contrast  Result Date: 07/21/2022 CLINICAL DATA:  26 year old male with left facial swelling. Possible dental infection. EXAM: CT MAXILLOFACIAL WITH CONTRAST TECHNIQUE: Multidetector CT imaging of the maxillofacial structures was performed with intravenous contrast. Multiplanar CT image reconstructions were also generated. RADIATION DOSE REDUCTION: This exam was performed according to the departmental dose-optimization program which includes automated exposure control, adjustment of the mA and/or kV according to patient size and/or use of iterative reconstruction technique. CONTRAST:  34mL OMNIPAQUE IOHEXOL 350 MG/ML SOLN COMPARISON:  None Available. FINDINGS: Osseous: Mandible intact and normally located. Widespread bilateral dental caries. But pronounced carious left maxillary anterior bicuspid. But periapical lucency at the left maxilla alveolar recess is large, up to 12 mm  diameter, also involves the roots of the left canine and incisors. A separate odontogenic cyst may be possible. Still, there is evidence of lateral cortical dehiscence at the root of the left canine on series 9, image 28. And regional soft tissue swelling and stranding is present. Furthermore, there is evidence of a small anterior subperiosteal abscess overlying the roots of the incisors on series 3, image 44 measuring up to 3 mm in thickness and about 10 mm length. No regional soft tissue gas. Elsewhere the bilateral maxilla, pterygoid, zygoma, and nasal bones appear intact. Central skull base appears intact. Nonspecific reversal of cervical lordosis. But visible cervical vertebrae appear intact. Orbits: Intact orbital walls. Left premalar space soft tissue thickening and stranding contiguous with the other left facial inflammation. But orbits and intraorbital soft tissues appear normal. Sinuses: Moderate opacification of the left maxillary sinus, especially the alveolar recess and this could be odontogenic. Other paranasal sinuses are well aerated. Tympanic cavities and mastoids are clear. Soft tissues: Left face  and anterior masticator space inflammation as above. Sublingual space, submandibular spaces, parotid, parapharyngeal and retropharyngeal spaces remain normal. Negative visible thyroid, larynx, pharynx. Reactive appearing left level 1 and level 2 lymph nodes. No cystic or necrotic nodes. Major vascular structures in the neck and at the skull base appear to be patent. Limited intracranial: Negative. IMPRESSION: 1. Odontogenic Left Facial Cellulitis suspected with inflammation surrounding a periapical cortical dehiscence of the left maxilla at the carious anterior bicuspid. But there is a larger 12 mm area of lucency of the left anterior maxillary alveolus, and a small overlying anterior Subperiosteal Abscess. See series 3 image 44 and series 9, image 28). Widespread other dental caries. Probable odontogenic  left maxillary sinusitis. OMFS consultation may be valuable. 2. Reactive left level 1 and level 2 lymph nodes. Electronically Signed   By: Odessa Fleming M.D.   On: 07/21/2022 08:44    Pertinent labs & imaging results that were available during my care of the patient were reviewed by me and considered in my medical decision making (see MDM for details).  Medications Ordered in ED Medications  nicotine (NICODERM CQ - dosed in mg/24 hours) patch 21 mg (21 mg Transdermal Patch Applied 07/21/22 1143)  enoxaparin (LOVENOX) injection 40 mg (has no administration in time range)  acetaminophen (TYLENOL) tablet 650 mg (has no administration in time range)    Or  acetaminophen (TYLENOL) suppository 650 mg (has no administration in time range)  oxyCODONE (Oxy IR/ROXICODONE) immediate release tablet 5 mg (has no administration in time range)  polyethylene glycol (MIRALAX / GLYCOLAX) packet 17 g (has no administration in time range)  ketorolac (TORADOL) 15 MG/ML injection 15 mg (15 mg Intravenous Given 07/21/22 0757)  iohexol (OMNIPAQUE) 350 MG/ML injection 75 mL (75 mLs Intravenous Contrast Given 07/21/22 0816)  Benzocaine (HURRCAINE) 20 % mouth spray ( Mouth/Throat Given by Other 07/21/22 1000)  Ampicillin-Sulbactam (UNASYN) 3 g in sodium chloride 0.9 % 100 mL IVPB (0 g Intravenous Stopped 07/21/22 1146)  morphine (PF) 4 MG/ML injection 4 mg (4 mg Intravenous Given 07/21/22 1142)                                                                                                                                     Procedures Procedures  (including critical care time)  Medical Decision Making / ED Course   MDM:  26 year old male presenting with facial swelling.  On exam patient has swelling to the left side of the face.  He has tenderness to percussion over the canine and incisor of the upper mouth.  There is possibly a small periapical abscess.  Given facial swelling will obtain CT scan.  Will evaluate after CT  scan, if no surgical pathology identified, may try drainage of small periapical abscess  Clinical Course as of 07/21/22 1209  Sun Jul 21, 2022  1137 CT scan with possible periosteal abscess, facial cellulitis.  WBC count significantly elevated  to 19.  Status post Unasyn.  Will admit for monitoring and IV antibiotics.  Patient likely would benefit from a OMFS evaluation, they are not on-call today, I do not think he needs emergent transfer for surgery, and OMFS will be on-call tomorrow and can evaluate the patient.  Discussed the hospitalist will admit the patient. [WS]    Clinical Course User Index [WS] Cristie Hem, MD     Additional history obtained: -External records from outside source obtained and reviewed including: Chart review including previous notes, labs, imaging, consultation notes   Lab Tests: -I ordered, reviewed, and interpreted labs.   The pertinent results include:   Labs Reviewed  BASIC METABOLIC PANEL - Abnormal; Notable for the following components:      Result Value   CO2 21 (*)    Glucose, Bld 113 (*)    All other components within normal limits  CBC WITH DIFFERENTIAL/PLATELET - Abnormal; Notable for the following components:   WBC 19.4 (*)    Neutro Abs 15.2 (*)    Monocytes Absolute 1.8 (*)    Abs Immature Granulocytes 0.12 (*)    All other components within normal limits  HIV ANTIBODY (ROUTINE TESTING W REFLEX)      Imaging Studies ordered: I ordered imaging studies including CT Face On my interpretation imaging demonstrates facial cellulitis/abscess I independently visualized and interpreted imaging. I agree with the radiologist interpretation   Medicines ordered and prescription drug management: Meds ordered this encounter  Medications   ketorolac (TORADOL) 15 MG/ML injection 15 mg   iohexol (OMNIPAQUE) 350 MG/ML injection 75 mL   Benzocaine (HURRCAINE) 20 % mouth spray   Ampicillin-Sulbactam (UNASYN) 3 g in sodium chloride 0.9 % 100 mL  IVPB    Order Specific Question:   Antibiotic Indication:    Answer:   Other Indication (list below)    Order Specific Question:   Other Indication:    Answer:   dental infection   morphine (PF) 4 MG/ML injection 4 mg   DISCONTD: lidocaine (PF) (XYLOCAINE) 1 % injection 5 mL   nicotine (NICODERM CQ - dosed in mg/24 hours) patch 21 mg   enoxaparin (LOVENOX) injection 40 mg   OR Linked Order Group    acetaminophen (TYLENOL) tablet 650 mg    acetaminophen (TYLENOL) suppository 650 mg   oxyCODONE (Oxy IR/ROXICODONE) immediate release tablet 5 mg   polyethylene glycol (MIRALAX / GLYCOLAX) packet 17 g    -I have reviewed the patients home medicines and have made adjustments as needed   Consultations Obtained: I requested consultation with the hospitalist,  and discussed lab and imaging findings as well as pertinent plan - they recommend: admission   Social Determinants of Health:  Factors impacting patients care include: current smoker   Reevaluation: After the interventions noted above, I reevaluated the patient and found that they have improved  Co morbidities that complicate the patient evaluation  Past Medical History:  Diagnosis Date   Current smoker    History of substance use    Seizures ([place])       Dispostion: Admit    Final Clinical Impression(s) / ED Diagnoses Final diagnoses:  Facial cellulitis  Tooth abscess     This chart was dictated using voice recognition software.  Despite best efforts to proofread,  errors can occur which can change the documentation meaning.    Cristie Hem, MD 07/21/22 1209

## 2022-07-21 NOTE — H&P (Addendum)
Date: 07/21/2022               Patient Name:  Jaime Nixon MRN: [id number]  DOB: April 24, 1996 Age / Sex: 26 y.o., male   PCP: Patient, No Pcp Per         Medical Service: Internal Medicine Teaching Service         Attending Physician: Dr. Lucious Groves, DO    First Contact: Dr. 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  He states that he developed the swelling overnight.  He reports that for the past 2 weeks he has been dealing with a dental infection.  On 07/08/2022 he was seen in urgent care and reported the same dental infection.  At that time, patient was given a 10-day course of amoxicillin.  He states that he took this and this helped his tooth pain go away.  He states that 2 days ago, the pain came back and so he decided to go to urgent care yesterday.  At urgent care, they gave him clindamycin, and he states that he started taking it yesterday.  Overnight, he states that the pain got worse, and his facial swelling started.  He denies any trouble swallowing.  He denies any trouble breathing.  He denies any drainage.  He reports that he did wake up with night sweats over the past 2 days.  He denies any fever or chills.  He does report to help with this pain, he has tried ibuprofen, and has tried about 2000 to 3000 mg a day for the past 2 weeks.  He reports that he went through a 500 tablet ibuprofen bottle in 2 weeks.  He states that he has tried Orajel with no comfort.  Patient does report that he has been having some abdominal pain.  He denies any melena or  hematochezia.  Patient does report that he has not eaten well in the past 3 days as it is hard for him to chew.  Patient has no other concerns at this time.  ED Course: Patient seen in the emergency room with initial vital signs of patient being afebrile, pulse 90, respiratory rate 16, blood pressure 139/59, satting at 98% on room air. Initial labs showing creatinine at baseline at 0.94. Bicarb slightly decreased at 21.  Normal electrolytes.  CBC showing elevated white count at 19.4.  Hemoglobin 16.4.  Imaging in the emergency room showing CT maxillofacial showing odontogenic left facial cellulitis with periapical abscess noted.  Patient received Unasyn, Toradol as well as morphine in the emergency department for pain.  ED physician also tried to drain abscess, with no success.  Given OMFS not available today, it was recommended patient be admitted today, for OMFS consult tomorrow. Meds:  No outpatient medications have been marked as taking for the 07/21/22 encounter [facility] Encounter).    Past Medical History  History reviewed. No pertinent surgical history.  Social:  Lives With: Wife and 2 kids Occupation: Flooring Support: Good support at home  Level of Function: Independent in all ADLs and IADLs PCP: No PCP Substances: Patient reports smoking tobacco (10 to 15 cigarettes/day for the last 15 years).  Patient denies any alcohol use.  Patient reports smoking marijuana (2 bowls a day for the last 8 to 9 months).  Patient does report smoking more marijuana prior to the last 8 to 9 months but is trying to cut down.  Family History:  Family history pertinent for older brother with endocarditis.  Father with seizure disorder. Allergies:  Allergies as of 07/21/2022 - Review Complete 07/21/2022  Allergen Reaction Noted   Dexamethasone Shortness Of Breath and Other (See Comments) 05/24/2013   Prednisone Other (See Comments) 05/24/2013    Review of Systems: A complete ROS was negative except  as per HPI.   OBJECTIVE:   Physical Exam: Blood pressure 125/84, pulse 75, temperature (!) 97.5 F (36.4 C), temperature source Oral, resp. rate 18, SpO2 99 %.  Constitutional: Well-appearing with noticeable left facial swelling sitting in bed, in no acute distress HENT: Left facial swelling noted with erythema noted.  Tender to palpation at upper lip.  Poor dental hygiene noted.  No drainage noted.  Palate well visible. Eyes: conjunctiva non-erythematous Neck: Left cervical lymphadenopathy noted Cardiovascular: regular rate and rhythm, no m/r/g Pulmonary/Chest: normal work of breathing on room air, lungs clear to auscultation bilaterally Abdominal: soft, non-tender, non-distended MSK: normal bulk and tone Skin: warm and dry  Labs: CBC    Component Value Date/Time   WBC 19.4 (H) 07/21/2022 0746   RBC 5.25 07/21/2022 0746   HGB 16.4 07/21/2022 0746   HCT 47.6 07/21/2022 0746   PLT 279 07/21/2022 0746   MCV 90.7 07/21/2022 0746   MCH 31.2 07/21/2022 0746   MCHC 34.5 07/21/2022 0746   RDW 13.0 07/21/2022 0746   LYMPHSABS 2.0 07/21/2022 0746   MONOABS 1.8 (H) 07/21/2022 0746   EOSABS 0.3 07/21/2022 0746   BASOSABS 0.1 07/21/2022 0746     CMP     Component Value Date/Time   NA 140 07/21/2022 0746   K 4.1 07/21/2022 0746   CL 106 07/21/2022 0746   CO2 21 (L) 07/21/2022 0746   GLUCOSE 113 (H) 07/21/2022 0746   BUN 13 07/21/2022 0746   CREATININE 0.94 07/21/2022 0746   CALCIUM 9.8 07/21/2022 0746   PROT 7.3 12/31/2020 1140   ALBUMIN 4.4 12/31/2020 1140   AST 24 12/31/2020 1140   ALT 14 12/31/2020 1140   ALKPHOS 59 12/31/2020 1140   BILITOT 0.5 12/31/2020 1140   GFRNONAA >60 07/21/2022 0746   GFRAA >60 05/29/2020 0649    Imaging: CT Maxillofacial: Showing Left facial cellulitis and periapical abscess.   ASSESSMENT & PLAN:   Assessment & Plan by Problem: Principal Problem:   Tooth infection Active Problems:   Seizure disorder ([place])   Tobacco dependence    Excessive use of nonsteroidal anti-inflammatory drugs (NSAIDs)   Jaime Nixon is a 26 y.o. person living with a history off seizures who presented with concerns of dental pain and swelling admitted for facial cellulitis and periapical abscess.  #Facial Cellulitis  #Periapical dental Abscess   2-week history of dental infection.  Patient reports that he has tried amoxicillin and clindamycin with no relief.  Patient presents afebrile and normotensive.  Initial white count 19.4.  CT scan showing left facial cellulitis as well as periapical dental abscess.  ED physician did attempt drainage, but was unsuccessful.  Emergency department started Unasyn.  Patient also given pain relief with  Toradol and morphine.  Plan will be to consult with oral maxillofacial surgeon.  No concern for significant airway obstruction, as patient is satting well into the high 90s on room air.  No concern for esophageal obstruction, as patient is able to tolerate swallowing, but just could not tolerate chewing.  We will continue to monitor for worsening symptoms -Continue pain control with oxycodone 5 mg every 4 hours as needed -Continue Unasyn 3g Q6H -Consult oral maxillofacial surgery tomorrow a.m. -Continue to monitor for worsening edema  #Seizure disorder History of seizure disorder after TBI.  Patient has not taken any antiepileptic medication in the past 3 years. -Continue to monitor for any seizure-like activity  #Tobacco dependence Patient smokes 10 to 15 cigarettes/day for the past 15 years -Provide nicotine patch  #Excessive use of NSAIDs Patient reports eating about 500 tablets of ibuprofen in the past 2 weeks him taking about 2 to 3000 mg each day.  Concern for acute kidney injury and GI bleed, but at this point kidney function seems normal and at baseline.  Hemoglobin stable. -Continue to monitor -Patient counseled on use of NSAIDs  Diet: Tube Feeds VTE: Lovenox IVF: D10,Bolus Code: Full  Prior to  Admission Living Arrangement: Home, living with wife and kids Anticipated Discharge Location: Home Barriers to Discharge: Clinical Improvement   Dispo: Admit patient to Observation with expected length of stay less than 2 midnights.  Signed: Leigh Aurora, DO Internal Medicine Resident PGY-1 [phone] 07/21/2022, 12:20 PM

## 2022-07-21 NOTE — ED Notes (Signed)
Patient transported to CT 

## 2022-07-22 ENCOUNTER — Other Ambulatory Visit (HOSPITAL_COMMUNITY): Payer: Self-pay

## 2022-07-22 DIAGNOSIS — F1990 Other psychoactive substance use, unspecified, uncomplicated: Secondary | ICD-10-CM

## 2022-07-22 DIAGNOSIS — L03211 Cellulitis of face: Secondary | ICD-10-CM

## 2022-07-22 LAB — CBC
HCT: 44.5 % (ref 39.0–52.0)
Hemoglobin: 15 g/dL (ref 13.0–17.0)
MCH: 30.5 pg (ref 26.0–34.0)
MCHC: 33.7 g/dL (ref 30.0–36.0)
MCV: 90.6 fL (ref 80.0–100.0)
Platelets: 267 10*3/uL (ref 150–400)
RBC: 4.91 MIL/uL (ref 4.22–5.81)
RDW: 13.1 % (ref 11.5–15.5)
WBC: 12.1 10*3/uL — ABNORMAL HIGH (ref 4.0–10.5)
nRBC: 0 % (ref 0.0–0.2)

## 2022-07-22 LAB — BASIC METABOLIC PANEL
Anion gap: 9 (ref 5–15)
BUN: 11 mg/dL (ref 6–20)
CO2: 23 mmol/L (ref 22–32)
Calcium: 9.6 mg/dL (ref 8.9–10.3)
Chloride: 107 mmol/L (ref 98–111)
Creatinine, Ser: 0.76 mg/dL (ref 0.61–1.24)
GFR, Estimated: >60 mL/min (ref >60)
Glucose, Bld: 102 mg/dL — ABNORMAL HIGH (ref 70–99)
Potassium: 3.9 mmol/L (ref 3.5–5.1)
Sodium: 139 mmol/L (ref 135–145)

## 2022-07-22 LAB — HIV ANTIBODY (ROUTINE TESTING W REFLEX): HIV Screen 4th Generation wRfx: NONREACTIVE

## 2022-07-22 MED ORDER — AMOXICILLIN-POT CLAVULANATE 875-125 MG PO TABS
1.0000 | ORAL_TABLET | Freq: Two times a day (BID) | ORAL | 0 refills | Status: AC
  Filled 2022-07-22: qty 20, 10d supply, fill #0

## 2022-07-22 MED ORDER — OXYCODONE HCL 5 MG PO TABS: 5.0000 mg | ORAL_TABLET | Freq: Four times a day (QID) | ORAL | 0 refills | Status: DC | PRN

## 2022-07-22 MED ORDER — OXYCODONE HCL 5 MG PO TABS
5.0000 mg | ORAL_TABLET | Freq: Four times a day (QID) | ORAL | 0 refills | Status: AC | PRN
  Filled 2022-07-22: qty 20, 5d supply, fill #0

## 2022-07-22 MED ORDER — PANTOPRAZOLE SODIUM 20 MG PO TBEC
20.0000 mg | DELAYED_RELEASE_TABLET | Freq: Every day | ORAL | Status: DC
  Filled 2022-07-22: qty 1

## 2022-07-22 MED ORDER — PANTOPRAZOLE SODIUM 40 MG PO TBEC
40.0000 mg | DELAYED_RELEASE_TABLET | Freq: Every day | ORAL | 0 refills | Status: DC
  Filled 2022-07-22: qty 30, 30d supply, fill #0

## 2022-07-22 MED ORDER — ACETAMINOPHEN 500 MG PO TABS
1000.0000 mg | ORAL_TABLET | Freq: Three times a day (TID) | ORAL | Status: DC
  Administered 2022-07-22: 1000 mg via ORAL
  Filled 2022-07-22: qty 2

## 2022-07-22 MED ORDER — ACETAMINOPHEN 500 MG PO TABS
1000.0000 mg | ORAL_TABLET | Freq: Three times a day (TID) | ORAL | 0 refills | Status: AC
  Filled 2022-07-22: qty 30, 5d supply, fill #0

## 2022-07-22 NOTE — Progress Notes (Signed)
Mobility Specialist Progress Note:   07/22/22 0945  Mobility  Activity Ambulated independently in hallway  Level of Assistance Independent  Assistive Device None  Distance Ambulated (ft) 1100 ft  Activity Response Tolerated well  $Mobility charge 1 Mobility   Pt received in bed and agreeable. No complaints. Pt left ambulating in room with all needs met and call bell in reach.   Symiah Nowotny Mobility Specialist-Acute Rehab Secure Chat only

## 2022-07-22 NOTE — Discharge Summary (Signed)
Name: Jaime Nixon MRN: [id number] DOB: 1996/01/11 26 y.o. PCP: Patient, No Pcp Per  Date of Admission: 07/21/2022  6:49 AM Date of Discharge: No discharge date for patient encounter. Attending Physician: Lucious Groves, DO  Discharge Diagnosis:  Principal Problem:   Tooth infection Active Problems:   Seizure disorder ([place])   Tobacco dependence   Excessive use of nonsteroidal anti-inflammatory drugs (NSAIDs)       Discharge Medications: Allergies as of 07/22/2022       Reactions   Dexamethasone Shortness Of Breath, Other (See Comments)   Reaction:  Makes pt angry    Prednisone Other (See Comments)   Reaction:  Makes pt angry         Medication List     STOP taking these medications    cetirizine 10 MG tablet Commonly known as: ZYRTEC   clindamycin 300 MG capsule Commonly known as: Cleocin   ibuprofen 200 MG tablet Commonly known as: ADVIL       TAKE these medications    acetaminophen 500 MG tablet Commonly known as: TYLENOL Take 2 tablets (1,000 mg total) by mouth 3 (three) times daily for 5 days.   amoxicillin-clavulanate 875-125 MG tablet Commonly known as: AUGMENTIN Take 1 tablet by mouth 2 (two) times daily for 10 days.   famotidine 20 MG tablet Commonly known as: PEPCID Take 1 tablet (20 mg total) by mouth 2 (two) times daily.   oxyCODONE 5 MG immediate release tablet Commonly known as: Oxy IR/ROXICODONE Take 1 tablet (5 mg total) by mouth every 6 (six) hours as needed for up to 5 days for breakthrough pain or severe pain.   pantoprazole 40 MG tablet Commonly known as: Protonix Take 1 tablet (40 mg total) by mouth daily. What changed:  medication strength how much to take        Disposition and follow-up:   JaimeTroi BENTLEY Nixon was discharged from [facility] in Good condition.  At the hospital follow up visit please address:  1.  Periapical dental abscess with facial cellulitis - Ensure patient has been  taking amoxicillin-clavulanate, ending 10-5 - Ensure patient has a follow-up appointment with oral surgery - Ensure patient follows up with his dentist on 10-5        Excessive NSAID use - Ensure patient has normal renal function with BMP - Ensure patient has not been experiencing any concerning GI symptoms   2.  Labs / imaging needed at time of follow-up: none  3.  Pending labs/ test needing follow-up: BMP   Follow-up Appointments:  Follow-up [facility].   Why: call Monday and schedule appointment to establish a priamry care doctor and for hospital follow up. They have finacial counselling and a pharmacy and will work with you Contact information: [address] [place] [address] [place] [facility] Course by problem list:  1. Periapical dental abscess with facial cellulitis   Patient presented with increased dental pain and swelling secondary to a two-week-long dental infection. Upon arrival, patient was afebrile and normotensive. Patient was breathing well and could swallow, but could not tolerate chewing due to significant pain. Laboratory testing notable for elevated WBC count. Head CT revealed facial cellulitis and a periapical dental abscess. Ampicillin-sulbactam was started in the ED and pain managed with oxycodone 5mg  q4 PRN. Oral surgery  team was consulted and are recommending outpatient follow-up versus inpatient surgery given small size of abscess. Upon discharge, patient demonstrated significant symptomatic improvement and WBCs were down-trending. He was discharged with a ten-day course of amoxicillin-clavulanate plus acetaminophen and oxycodone for pain management.   2. Excessive use of NSAIDs   Patient reports consuming about 500 tablets of ibuprofen over the last two weeks, about 2000-3000mg  per day on average. Creatinine, electrolytes, and hemoglobin have  been stable throughout admission. He did not experience any episodes of hematochezia or melena. Upon discharge, patient denied any abdominal pain. He was discharged with a one-month supply of prophylactic pantoprazole.    Discharge Exam:   BP 113/70 (BP Location: Left Arm)   Pulse 71   Temp 98 F (36.7 C) (Oral)   Resp 18   Ht 6' (1.829 m)   Wt 87.5 kg   SpO2 98%   BMI 26.18 kg/m  Discharge exam:  HENT: mild left facial swelling and tenderness to palpation at upper lip both improved from prior exam, poor dental hygiene with multiple cavities, no drainage noted, palate easily visualized  Eyes: conjunctiva non-erythematous Neck: left cervical lymphadenopathy noted Cardiovascular: regular rate and rhythm, no murmurs Pulmonary/Chest: normal work of breathing on room air, lungs clear to auscultation bilaterally Abdominal: soft, non-tender, non-distended MSK: normal bulk and tone Skin: warm and dry  Pertinent Labs, Studies, and Procedures:     Latest Ref Rng & Units 07/22/2022   10:08 AM 07/21/2022    7:46 AM 12/31/2020   11:40 AM  BMP  Glucose 70 - 99 mg/dL 102  113  175   BUN 6 - 20 mg/dL 11  13  23    Creatinine 0.61 - 1.24 mg/dL 0.76  0.94  0.92   Sodium 135 - 145 mmol/L 139  140  137   Potassium 3.5 - 5.1 mmol/L 3.9  4.1  3.6   Chloride 98 - 111 mmol/L 107  106  107   CO2 22 - 32 mmol/L 23  21  19    Calcium 8.9 - 10.3 mg/dL 9.6  9.8  9.7       Latest Ref Rng & Units 07/22/2022   10:08 AM 07/21/2022    7:46 AM 12/31/2020   11:40 AM  CBC  WBC 4.0 - 10.5 K/uL 12.1  19.4  20.1   Hemoglobin 13.0 - 17.0 g/dL 15.0  16.4  15.4   Hematocrit 39.0 - 52.0 % 44.5  47.6  42.9   Platelets 150 - 400 K/uL 267  279  263     Maxillofacial CT on 9-24: IMPRESSION: 1. Odontogenic Left Facial Cellulitis suspected with inflammation surrounding a periapical cortical dehiscence of the left maxilla at the carious anterior bicuspid. But there is a larger 12 mm area of lucency of the left  anterior maxillary alveolus, and a small overlying anterior Subperiosteal Abscess. See series 3 image 44 and series 9, image 28). Widespread other dental caries. Probable odontogenic left maxillary sinusitis. OMFS consultation may be valuable. 2. Reactive left level 1 and level 2 lymph nodes.    Discharge Instructions: Discharge Instructions     Call MD for:  persistant nausea and vomiting   Complete by: As directed    Call MD for:  redness, tenderness, or signs of infection (pain, swelling, redness, odor or green/yellow discharge around incision site)   Complete by: As directed    Call MD for:  severe uncontrolled pain   Complete by: As directed    Call MD  for:  temperature >100.4   Complete by: As directed    Diet - low sodium heart healthy   Complete by: As directed    Discharge instructions   Complete by: As directed    Mr Loga,  It was a pleasure taking care of you in the hospital. You were hospitalized for a tooth infection and treated with antibiotics. Your infection was small enough that emergent surgery was unnecessary and it should resolve just with the antibiotics that we have prescribed you. We are also giving you a short course of medications for pain.  Finally, given that you took a lot of ibuprofen before you came in, we are prescribing you some stomach medications that will help prevent ulcers from developing. Please return to the emergency room if you notice any red or black tarry stools, which could be a sign of bleeding.  Please follow up with your dentist on 10-5 as scheduled. Your oral surgeon, Dr Zelphia Cairo, should reach out to you as well. His office number is [phone].   Increase activity slowly   Complete by: As directed        Signed: Roswell Nickel, MD Internal Medicine PGY-1 Pager [phone]

## 2022-07-24 NOTE — Telephone Encounter (Signed)
Patient was on our inpatient service. Has appt to establish care with Dr. Alton Revere on 08/06/22.

## 2022-07-24 NOTE — Telephone Encounter (Signed)
Sent a work note to Mr. Shein on Mychart this morning.

## 2022-08-06 ENCOUNTER — Ambulatory Visit (INDEPENDENT_AMBULATORY_CARE_PROVIDER_SITE_OTHER): Payer: Self-pay

## 2022-08-06 DIAGNOSIS — K047 Periapical abscess without sinus: Secondary | ICD-10-CM

## 2022-08-06 NOTE — Assessment & Plan Note (Signed)
Patient was discharged on 9/25 after being hospitalized for periapical dental abscess with facial cellulitis. Patient had down-trending leukocytosis on day of discharge. Discharged w/ 10-day course of amoxicillin-clavulanate 875-125 MG BID plus acetaminophen and oxycodone for pain management. Patient finished 8 days of antibiotics but was unable to take the last 2 days worth due to his children breaking them. Patient visited with the dentist last week who recommends a root canal and tooth removal. Patient will call the provided oral maxillofacial surgeon to have these procedures done. Patient states that his pain has resolved since his hospitalization. He denies fever, chills, swelling, bleeding, or drainage. On exam, patient has several dental caries present on upper molars bilaterally but otherwise no facial swelling or erythema, no significant swelling or erythema of the gums or surrounding tissue, and no bleeding or drainage noted.  Plan: - Recommended f/u w/  oral maxillofacial surgeon - Discussed importance of maintaining good oral hygiene  - Recommend tylenol for pain at home

## 2022-08-06 NOTE — Progress Notes (Signed)
   CC: hospital f/u visit  HPI:  Mr.Aydn Lenna Sciara Nabozny is a 26 y.o. male with past medical history of cannabinoid hyperemesis syndrome, panic attacks, MDD w/ psychotic features, and seizure that presents for a hospital f/u visit.   Patient was discharged on 9/25 after being hospitalized for periapical dental abscess with facial cellulitis. Discharged w/ 10-day course of amoxicillin-clavulanate 875-125 MG BID plus acetaminophen and oxycodone for pain management. Patient finished 8 days of antibiotics but was unable to take the last 2 days worth due to his children breaking them. Patient visited with the dentist last week who recommends a root canal and tooth removal. Patient will call the provided oral maxillofacial surgeon to have these procedures done. Patient states that his pain has resolved since his hospitalization. He denies fever, chills, swelling, bleeding, or drainage.   The patient would not like the influenza vaccine or hepatitis C screening today.  Allergies as of 08/06/2022       Reactions   Dexamethasone Shortness Of Breath, Other (See Comments)   Reaction:  Makes pt angry    Prednisone Other (See Comments)   Reaction:  Makes pt angry         Medication List        Accurate as of August 06, 2022  7:18 AM. If you have any questions, ask your nurse or doctor.          famotidine 20 MG tablet Commonly known as: PEPCID Take 1 tablet (20 mg total) by mouth 2 (two) times daily.   pantoprazole 40 MG tablet Commonly known as: Protonix Take 1 tablet (40 mg total) by mouth daily.         Past Medical History:  Diagnosis Date   Current smoker    History of substance use    Seizures ([place])    Review of Systems:  per HPI.   Physical Exam: Vitals:   08/06/22 1611  BP: 117/72  Pulse: 82  Temp: 98.1 F (36.7 C)  TempSrc: Oral  SpO2: 97%  Weight: 194 lb (88 kg)  Height: 6' (1.829 m)   Constitutional: Well-developed, well-nourished, appears comfortable  HENT:  Normocephalic and atraumatic. No facial swelling or erythema. No significant swelling or erythema of the gums or surrounding tissue. No bleeding or drainage noted. Several dental caries present on upper molars bilaterally.  Neck: Normal range of motion.  Cardiovascular: Regular rate, regular rhythm. No murmurs, rubs, or gallops. Normal radial and PT pulses bilaterally. No LE edema.  Pulmonary: Normal respiratory effort. No wheezes, rales, or rhonchi.   Abdominal: Normal bowel sounds.  Musculoskeletal: Normal range of motion.     Neurological: Alert and oriented to person, place, and time. Non-focal. Skin: warm and dry.    Assessment & Plan:   See Encounters Tab for problem based charting.  Patient seen with Dr. Evette Doffing

## 2022-08-06 NOTE — Patient Instructions (Addendum)
Thank you for coming to see [place] in clinic Jaime Nixon.   Plan: - Please follow up with your oral maxillofacial surgeon to have your procedures done - Please take over the counter tylenol at home for your pain  It was very nice to meet you.

## 2022-08-07 NOTE — Progress Notes (Signed)
Internal Medicine Clinic Attending  I saw and evaluated the patient.  I personally confirmed the key portions of the history and exam documented by Dr. Mapp and I reviewed pertinent patient test results.  The assessment, diagnosis, and plan were formulated together and I agree with the documentation in the resident's note.  

## 2022-10-06 ENCOUNTER — Emergency Department (HOSPITAL_COMMUNITY)
Admission: EM | Admit: 2022-10-06 | Discharge: 2022-10-06 | Disposition: A | Discharge status: Home / Self Care | Payer: Medicaid Other | Attending: Emergency Medicine | Admitting: Emergency Medicine

## 2022-10-06 DIAGNOSIS — K029 Dental caries, unspecified: Secondary | ICD-10-CM | POA: Insufficient documentation

## 2022-10-06 DIAGNOSIS — K047 Periapical abscess without sinus: Secondary | ICD-10-CM | POA: Diagnosis not present

## 2022-10-06 DIAGNOSIS — K0889 Other specified disorders of teeth and supporting structures: Secondary | ICD-10-CM | POA: Diagnosis present

## 2022-10-06 MED ORDER — AMOXICILLIN-POT CLAVULANATE 875-125 MG PO TABS: 1.0000 | ORAL_TABLET | Freq: Two times a day (BID) | ORAL | 0 refills | Status: DC

## 2022-10-06 MED ORDER — OXYCODONE-ACETAMINOPHEN 5-325 MG PO TABS
1.0000 | ORAL_TABLET | Freq: Once | ORAL | Status: AC
  Administered 2022-10-06: 1 via ORAL
  Filled 2022-10-06: qty 1

## 2022-10-06 MED ORDER — BENZOCAINE 20 % MT AERO
INHALATION_SPRAY | Freq: Once | OROMUCOSAL | Status: AC
  Filled 2022-10-06: qty 57

## 2022-10-06 NOTE — ED Provider Notes (Signed)
[facility] EMERGENCY DEPARTMENT Provider Note   CSN: [id number] Arrival date & time: 10/06/22  9024     History  Chief Complaint  Patient presents with   Dental Pain    Jaime Nixon is a 26 y.o. male who presents to the Emergency Department complaining of left upper dental pain onset 3 days ago. No dentist at this time. Has associated facial swelling. Tried OTC meds. He denies gum swelling/bleeding, fever, trouble swallowing, trouble breathing.     The history is provided by the patient. No language interpreter was used.       Home Medications Prior to Admission medications   Medication Sig Start Date End Date Taking? Authorizing Provider  amoxicillin-clavulanate (AUGMENTIN) 875-125 MG tablet Take 1 tablet by mouth every 12 (twelve) hours. 10/06/22  Yes Nayef [employer] A, PA-C  famotidine (PEPCID) 20 MG tablet Take 1 tablet (20 mg total) by mouth 2 (two) times daily. Patient not taking: Reported on 07/22/2022 12/31/20   Dietrich Pates, PA-C  pantoprazole (PROTONIX) 40 MG tablet Take 1 tablet (40 mg total) by mouth daily. 07/22/22 08/21/22  Crissie Sickles, MD  FLUoxetine (PROZAC) 20 MG capsule Take 1 capsule (20 mg total) by mouth daily. For depression Patient not taking: Reported on 02/24/2017 09/21/16 02/07/20  Armandina Stammer I, NP  levETIRAcetam (KEPPRA) 500 MG tablet Take 1 tablet (500 mg total) by mouth 2 (two) times daily. 12/13/18 02/07/20  Arthor Captain, PA-C      Allergies    Dexamethasone and Prednisone    Review of Systems   Review of Systems  Physical Exam Updated Vital Signs BP 120/81 (BP Location: Right Arm)   Pulse 72   Temp 98.3 F (36.8 C)   Resp 16   SpO2 100%  Physical Exam Vitals and nursing note reviewed.  Constitutional:      General: He is not in acute distress.    Appearance: He is not ill-appearing.  HENT:     Head: Normocephalic and atraumatic.     Right Ear: External ear normal.     Left Ear: External ear normal.      Nose: Nose normal.     Mouth/Throat:     Mouth: Mucous membranes are moist. Mucous membranes are not dry.     Dentition: Abnormal dentition. Dental tenderness and dental caries present.     Tongue: Tongue does not deviate from midline.     Pharynx: Oropharynx is clear. Uvula midline. No oropharyngeal exudate or posterior oropharyngeal erythema.     Tonsils: No tonsillar exudate or tonsillar abscesses.     Comments: Multiple dental caries noted throughout. Tenderness to palpation to left upper gum line with fluctuance noted at left upper canine. No trismus. No retropharyngeal abscess. No peritonsillar abscess noted. Uvula midline.  Tolerating oral secretions.  Able to speak in clear complete sentences.  No facial swelling appreciated. Eyes:     Extraocular Movements: Extraocular movements intact.  Cardiovascular:     Rate and Rhythm: Normal rate and regular rhythm.     Pulses: Normal pulses.     Heart sounds: Normal heart sounds.  Pulmonary:     Effort: Pulmonary effort is normal. No respiratory distress.     Breath sounds: Normal breath sounds.  Abdominal:     General: Bowel sounds are normal. There is no distension.     Palpations: Abdomen is soft. There is no mass.     Tenderness: There is no abdominal tenderness.  Musculoskeletal:  General: Normal range of motion.     Cervical back: Neck supple.  Lymphadenopathy:     Head:     Right side of head: No submental, submandibular, tonsillar, preauricular or posterior auricular adenopathy.     Left side of head: No submental, submandibular, tonsillar, preauricular or posterior auricular adenopathy.     Cervical: No cervical adenopathy.  Skin:    General: Skin is warm and dry.     Findings: No rash.  Neurological:     Mental Status: He is alert.  Psychiatric:        Behavior: Behavior normal.     ED Results / Procedures / Treatments   Labs (all labs ordered are listed, but only abnormal results are displayed) Labs Reviewed  - No data to display  EKG None  Radiology No results found.  Procedures .Marland KitchenIncision and Drainage  Date/Time: 10/06/2022 9:52 AM  Performed by: Karenann Cai, PA-C Authorized by: Karenann Cai, PA-C   Consent:    Consent obtained:  Verbal   Consent given by:  Patient   Risks discussed:  Bleeding, incomplete drainage and pain Universal protocol:    Patient identity confirmed:  Verbally with patient and hospital-assigned identification number Location:    Type:  Abscess   Location:  Mouth   Mouth location: Gum line of left upper canine. Sedation:    Sedation type:  None Anesthesia:    Anesthesia method:  Topical application   Topical anesthetic:  Benzocaine gel Procedure type:    Complexity:  Simple Procedure details:    Incision types:  Stab incision   Incision depth:  Submucosal   Wound management:  Irrigated with saline   Drainage:  Purulent and bloody   Drainage amount:  Moderate   Wound treatment:  Wound left open   Packing materials:  None Post-procedure details:    Procedure completion:  Tolerated well, no immediate complications     Medications Ordered in ED Medications  Benzocaine (HURRCAINE) 20 % mouth spray ( Mouth/Throat Given 10/06/22 1119)  oxyCODONE-acetaminophen (PERCOCET/ROXICET) 5-325 MG per tablet 1 tablet (1 tablet Oral Given 10/06/22 1243)    ED Course/ Medical Decision Making/ A&P Clinical Course as of 10/06/22 1308  Sun Oct 06, 2022  1306 Reevaluated and pain improved with treatment regimen in the ED.  Discussed with patient discharge treatment plan.  Answered all available questions.  Patient appears safe for discharge at this time. [SB]    Clinical Course User Index [SB] Nansi Birmingham A, PA-C                           Medical Decision Making Risk Prescription drug management.   Patient presents to the ED with left upper dental pain onset 3 days. Patient does not have a dentist.  Vital signs patient afebrile.  On exam patient  with Multiple dental caries noted throughout. Tenderness to palpation to left upper gum line with fluctuance noted. No trismus. No retropharyngeal abscess. No peritonsillar abscess noted. Uvula midline.  Tolerating oral secretions.  Able to speak in clear complete sentences.  No facial swelling appreciated. No cardiovascular or respiratory exam findings.  Differential diagnosis includes pharyngeal abscess, dental abscess, Ludwig's angina.    Medications:  I ordered medication including Percocet for pain management Reevaluation of the patient after these medicines and interventions, I reevaluated the patient and found that they have improved I have reviewed the patients home medicines and have made adjustments as needed  Disposition: Patient presentation suspicious for dentalgia with dental abscess.  Abscess drained in the ED today. Exam not concerning for Ludwig's angina or pharyngeal abscess. After consideration of the diagnostic results and the patients response to treatment, I feel that the patient would benefit from Discharge home. Will treat with Augmentin prescription.  Patient also provided with information for on-call dentist for follow-up.  Provided resource guide for dentists in the area, patient structured to follow-up with a dentist. Supportive care measures and strict return precautions discussed with patient at bedside. Pt acknowledges and verbalizes understanding. Pt appears safe for discharge. Follow up as indicated in discharge paperwork.  This chart was dictated using voice recognition software, Dragon. Despite the best efforts of this provider to proofread and correct errors, errors may still occur which can change documentation meaning.   Final Clinical Impression(s) / ED Diagnoses Final diagnoses:  Pain due to dental caries  Dental abscess    Rx / DC Orders ED Discharge Orders          Ordered    amoxicillin-clavulanate (AUGMENTIN) 875-125 MG tablet  Every 12 hours         10/06/22 1256              Jannat Rosemeyer A, PA-C 10/06/22 1308    Wynetta Fines, MD 10/07/22 [phone]

## 2022-10-06 NOTE — ED Provider Triage Note (Signed)
Emergency Medicine Provider Triage Evaluation Note  Jaime Nixon , a 26 y.o. male  was evaluated in triage.  Pt complains of left upper dental pain associated with facial edema. History of same. No difficulties breathing. No fever or chills.  Review of Systems  Positive: Facial edema Negative: fever  Physical Exam  BP 131/79 (BP Location: Right Arm)   Pulse 79   Temp 98.3 F (36.8 C)   Resp 14   SpO2 100%  Gen:   Awake, no distress   Resp:  Normal effort  MSK:   Moves extremities without difficulty  Other:  Possible abscess left upper gum line  Medical Decision Making  Medically screening exam initiated at 9:30 AM.  Appropriate orders placed.  Jaime Nixon was informed that the remainder of the evaluation will be completed by another provider, this initial triage assessment does not replace that evaluation, and the importance of remaining in the ED until their evaluation is complete.  Would attempt I&D Antibiotics, dental resources   Jesusita Oka 10/06/22 1607

## 2022-10-06 NOTE — ED Triage Notes (Signed)
Patient complains of left upper dental pain and face swelling that started three days ago. Patient is alert, oriented, afebrile ,and in no apparent distress at this time.

## 2022-10-06 NOTE — Discharge Instructions (Addendum)
It was a pleasure taking care of you today!   You will be prescribed Augmentin, take as directed and ensure to complete the entire course of the antibiotic. You may take over the counter 600 mg Ibuprofen every 6 hours and alternate with 500 mg Tylenol every 6 hours as directed.  Ensure that you keep gauze placed on the area throughout the day for the next couple of days, you may change out the gauze as needed.  You may also place ice to the area for up to 15 minutes at a time.  Information for the on-call Dentist is attached. Attached you will find a resource guide for dentists in the area, call and set up a follow up appointment. Return to the Emergency Department if you are experiencing increasing/worsening symptoms.

## 2022-11-23 ENCOUNTER — Ambulatory Visit: Payer: Medicaid Other

## 2022-12-07 ENCOUNTER — Encounter (HOSPITAL_COMMUNITY): Payer: Self-pay

## 2022-12-07 ENCOUNTER — Other Ambulatory Visit: Payer: Self-pay

## 2022-12-07 ENCOUNTER — Ambulatory Visit (INDEPENDENT_AMBULATORY_CARE_PROVIDER_SITE_OTHER): Payer: Medicaid Other

## 2022-12-07 ENCOUNTER — Ambulatory Visit (HOSPITAL_COMMUNITY)
Admission: RE | Admit: 2022-12-07 | Discharge: 2022-12-07 | Disposition: A | Discharge status: Home / Self Care | Payer: Medicaid Other | Source: Ambulatory Visit | Attending: Physician Assistant | Admitting: Physician Assistant

## 2022-12-07 VITALS — BP 122/83 | HR 77 | Temp 98.0°F | Resp 18

## 2022-12-07 DIAGNOSIS — M25532 Pain in left wrist: Secondary | ICD-10-CM

## 2022-12-07 DIAGNOSIS — W19XXXA Unspecified fall, initial encounter: Secondary | ICD-10-CM

## 2022-12-07 DIAGNOSIS — M25531 Pain in right wrist: Secondary | ICD-10-CM

## 2022-12-07 MED ORDER — NAPROXEN 500 MG PO TABS: 500.0000 mg | ORAL_TABLET | Freq: Two times a day (BID) | ORAL | 0 refills | Status: DC

## 2022-12-07 NOTE — ED Provider Notes (Signed)
[facility]    CSN: [id number] Arrival date & time: 12/07/22  1025      History   Chief Complaint Chief Complaint  Patient presents with   Wrist Pain    Entered by patient    HPI Jaime Nixon is a 27 y.o. male.   Patient presents today with a 2-week history of intermittent but more consistent right wrist pain.  Reports that over the past several days he is also developed left wrist pain to the point that it is interfering with his ability perform daily activities.  Prior to symptom onset he did lose his balance and fall onto a right outstretched hand.  He does not have significant pain symptoms seek evaluation at that time.  He reports gradually pain has been worsening and is currently rated 4 on a 0-10 pain scale but increases to 8 at night, worse with certain movements, localized to dorsal right wrist and radial left wrist with radiation into forearm, described as aching/throbbing with periodic sharp pains, no alleviating factors significant.  He has tried [employer] as well as ibuprofen without improvement.  He is right-handed.  Denies any numbness or paresthesias in the hands.  He denies previous injury or surgery involving wrist or hand.    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   Panic attacks 09/15/2016   Bradycardia    OD (overdose of drug), intentional self-harm, initial encounter ([place]) 09/10/2016   Bradycardia, drug induced 09/10/2016   Hypotension due to drugs 09/10/2016   Overdose 09/10/2016   Generalized abdominal pain    Hyperkalemia    Uncontrollable vomiting    Seizure disorder ([place])  05/26/2016   Cannabinoid hyperemesis syndrome 05/26/2016   Hypokalemia due to loss of potassium 05/26/2016   Tobacco dependence 05/26/2016   Nausea and vomiting 05/26/2016   Leukocytosis 12/27/2015   Abnormal finding on MRI of brain    Pachymeningitis     History reviewed. No pertinent surgical history.     Home Medications    Prior to Admission medications   Medication Sig Start Date End Date Taking? Authorizing Provider  naproxen (NAPROSYN) 500 MG tablet Take 1 tablet (500 mg total) by mouth 2 (two) times daily. 12/07/22  Yes Jabez Molner K, PA-C  famotidine (PEPCID) 20 MG tablet Take 1 tablet (20 mg total) by mouth 2 (two) times daily. Patient not taking: Reported on 07/22/2022 12/31/20   Delia Heady, PA-C  pantoprazole (PROTONIX) 40 MG tablet Take 1 tablet (40 mg total) by mouth daily. 07/22/22 08/21/22  Serita Butcher, MD  FLUoxetine (PROZAC) 20 MG capsule Take 1 capsule (20 mg total) by mouth daily. For depression Patient not taking: Reported on 02/24/2017 09/21/16 02/07/20  Lindell Spar I, NP  levETIRAcetam (KEPPRA) 500 MG tablet Take 1 tablet (500 mg total) by mouth 2 (two) times daily. 12/13/18 02/07/20  Margarita Mail, PA-C    Family History Family History  Problem Relation Age of Onset   Seizures Father    Seizures Brother     Social History Social History   Tobacco Use   Smoking status: Every Day  Packs/day: 1.00    Years: 10.00    Total pack years: 10.00    Types: Cigarettes   Smokeless tobacco: Never  Substance Use Topics   Alcohol use: Not Currently    Comment: rare use, but binges occasionally   Drug use: Yes    Types: Marijuana    Comment: every other day     Allergies   Dexamethasone and Prednisone   Review of Systems Review of Systems  Constitutional:  Positive for activity change. Negative for appetite change, fatigue and fever.  Gastrointestinal:  Negative for abdominal pain, diarrhea, nausea and vomiting.  Musculoskeletal:  Positive for  arthralgias. Negative for joint swelling and myalgias.  Neurological:  Negative for weakness and numbness.     Physical Exam Triage Vital Signs ED Triage Vitals  Enc Vitals Group     BP 12/07/22 1040 122/83     Pulse Rate 12/07/22 1040 77     Resp 12/07/22 1040 18     Temp 12/07/22 1040 98 F (36.7 C)     Temp src --      SpO2 12/07/22 1040 96 %     Weight --      Height --      Head Circumference --      Peak Flow --      Pain Score 12/07/22 1037 8     Pain Loc --      Pain Edu? --      Excl. in [place]? --    No data found.  Updated Vital Signs BP 122/83   Pulse 77   Temp 98 F (36.7 C)   Resp 18   SpO2 96%   Visual Acuity Right Eye Distance:   Left Eye Distance:   Bilateral Distance:    Right Eye Near:   Left Eye Near:    Bilateral Near:     Physical Exam Vitals reviewed.  Constitutional:      General: He is awake.     Appearance: Normal appearance. He is well-developed. He is not ill-appearing.     Comments: Very pleasant male appears stated age in no acute distress sitting comfortably in exam room  HENT:     Head: Normocephalic and atraumatic.  Cardiovascular:     Rate and Rhythm: Normal rate and regular rhythm.     Pulses:          Radial pulses are 2+ on the right side and 2+ on the left side.     Heart sounds: Normal heart sounds, S1 normal and S2 normal. No murmur heard.    Comments: Capillary refill within 2 seconds bilateral fingers Pulmonary:     Effort: Pulmonary effort is normal.     Breath sounds: Normal breath sounds. No stridor. No wheezing, rhonchi or rales.     Comments: Clear to auscultation bilaterally Musculoskeletal:     Right wrist: Tenderness present. No swelling, bony tenderness or snuff box tenderness. Decreased range of motion.     Left wrist: No swelling, tenderness, bony tenderness or snuff box tenderness. Normal range of motion.     Right hand: There is no disruption of two-point discrimination. Normal capillary refill.      Left hand: There is no disruption of two-point discrimination. Normal capillary refill.     Comments: Right wrist: Tenderness palpation of the dorsal right wrist without deformity.  Decreased range of motion with flexion and extension.  Normal pincer grip strength.  Hand neurovascularly intact.  No snuffbox tenderness.  Left  wrist: No significant point tenderness on exam.  No deformity noted.  Normal active range of motion.  Hand neurovascularly intact.  Neurological:     Mental Status: He is alert.  Psychiatric:        Behavior: Behavior is cooperative.      UC Treatments / Results  Labs (all labs ordered are listed, but only abnormal results are displayed) Labs Reviewed - No data to display  EKG   Radiology DG Wrist Complete Right  Result Date: 12/07/2022 CLINICAL DATA:  Pain and swelling after fall 2 weeks ago EXAM: RIGHT WRIST - COMPLETE 4 VIEW COMPARISON:  Right finger radiographs May 25, 2018, right wrist radiographs April 22, 2009 FINDINGS: There is no evidence of fracture or dislocation. There is no evidence of arthropathy or other focal bone abnormality. Soft tissues are unremarkable. IMPRESSION: Negative. Electronically Signed   By: Beryle Flock M.D.   On: 12/07/2022 11:43    Procedures Procedures (including critical care time)  Medications Ordered in UC Medications - No data to display  Initial Impression / Assessment and Plan / UC Course  I have reviewed the triage vital signs and the nursing notes.  Pertinent labs & imaging results that were available during my care of the patient were reviewed by me and considered in my medical decision making (see chart for details).     X-ray of right wrist was obtained given recent trauma and tenderness palpation which showed no acute osseous abnormality.  Suspect sprain as etiology of symptoms.  He was started on Naprosyn for pain relief with instruction to take additional NSAIDs with this medication due to risk of GI  bleeding.  Can use acetaminophen/Tylenol for breakthrough pain.  Recommended RICE protocol for symptom relief and he was placed in a brace for comfort and support.  Discussed that if his symptoms not improving quickly he should follow-up with orthopedics and was given contact information for local provider with instruction to call to schedule an appointment.  Discussed that if he has any worsening or changing symptoms including worsening pain, numbness, paresthesias he should be seen immediately.  Strict return precautions given.  Work excuse note provided.  Final Clinical Impressions(s) / UC Diagnoses   Final diagnoses:  Right wrist pain  Left wrist pain     Discharge Instructions      Take Naprosyn twice a day.  Do not take NSAIDs with this medication including aspirin, ibuprofen/Advil, naproxen/Aleve.  You can use acetaminophen/Tylenol for breakthrough pain.  Keep your wrist elevated and use ice for symptom relief.  I recommend using the brace particularly at night to help provide some relief.  If your symptoms do not improve quickly please follow-up with orthopedics; call to schedule appointment.  If anything worsens please return for reevaluation including increasing pain, weakness, numbness, tingling sensation.     ED Prescriptions     Medication Sig Dispense Auth. Provider   naproxen (NAPROSYN) 500 MG tablet Take 1 tablet (500 mg total) by mouth 2 (two) times daily. 30 tablet Lianah Peed, Derry Skill, PA-C      PDMP not reviewed this encounter.   Terrilee Croak, PA-C 12/07/22 1215

## 2022-12-07 NOTE — ED Triage Notes (Addendum)
Pt fell off a scooter 2 weeks ago and injured his RT wrist .Pt reports pain to bilateral wrist pain started 2 weeks ago. Pt reports Rt wrist pain started first then left wrist. Pt is having a hard time with doing his job due to pain.

## 2022-12-07 NOTE — Discharge Instructions (Signed)
Take Naprosyn twice a day.  Do not take NSAIDs with this medication including aspirin, ibuprofen/Advil, naproxen/Aleve.  You can use acetaminophen/Tylenol for breakthrough pain.  Keep your wrist elevated and use ice for symptom relief.  I recommend using the brace particularly at night to help provide some relief.  If your symptoms do not improve quickly please follow-up with orthopedics; call to schedule appointment.  If anything worsens please return for reevaluation including increasing pain, weakness, numbness, tingling sensation.

## 2023-01-10 ENCOUNTER — Ambulatory Visit (HOSPITAL_COMMUNITY): Payer: Self-pay

## 2023-01-10 ENCOUNTER — Encounter (HOSPITAL_COMMUNITY): Payer: Self-pay | Admitting: Emergency Medicine

## 2023-01-10 ENCOUNTER — Ambulatory Visit (HOSPITAL_COMMUNITY)
Admission: EM | Admit: 2023-01-10 | Discharge: 2023-01-10 | Disposition: A | Discharge status: Home / Self Care | Payer: Medicaid Other | Attending: Internal Medicine | Admitting: Internal Medicine

## 2023-01-10 DIAGNOSIS — J020 Streptococcal pharyngitis: Secondary | ICD-10-CM

## 2023-01-10 LAB — POCT RAPID STREP A, ED / UC: Streptococcus, Group A Screen (Direct): POSITIVE — AB

## 2023-01-10 MED ORDER — AMOXICILLIN-POT CLAVULANATE 875-125 MG PO TABS: 1.0000 | ORAL_TABLET | Freq: Two times a day (BID) | ORAL | 0 refills | Status: AC

## 2023-01-10 NOTE — Discharge Instructions (Signed)
Your strep test was positive. Recommend salt water gargling and over-the-counter Ibuprofen/Tylenol as needed for pain if you are not allergic. Augmentin has been called in for the strep throat. Augmentin is an antibiotic. Take as directed. Return in 2 to 3 days if no improvement. Please go directly to the Emergency Department immediately should you begin to have any of the following symptoms: increased pain, persistent fevers, difficulty swallowing, difficulty talking, drooling or difficulty breathing.

## 2023-01-10 NOTE — ED Triage Notes (Signed)
Pt's son dx strep on MOnday. Reports Wed pt started having sore throat. Denies taking medications for pain

## 2023-01-10 NOTE — ED Provider Notes (Signed)
[facility]   Note:  This document was prepared using Dragon voice recognition software and may include unintentional dictation errors.  MRN: [id number] DOB: 1996-08-20 DATE: 01/10/23   Subjective:  Chief Complaint:  Chief Complaint  Patient presents with   Sore Throat     HPI: Jaime Nixon is a 27 y.o. male presenting for sore throat for the past 2 days.  He states that his son tested positive for strep on Monday.  He is concerned that he might have strep as well.  Pain is worse with eating and swallowing.  Denies fever, otalgia, abdominal pain, nausea/vomiting. Endorses light cough. Presents NAD.  Prior to Admission medications   Medication Sig Start Date End Date Taking? Authorizing Provider  FLUoxetine (PROZAC) 20 MG capsule Take 1 capsule (20 mg total) by mouth daily. For depression Patient not taking: Reported on 02/24/2017 09/21/16 02/07/20  Lindell Spar I, NP  levETIRAcetam (KEPPRA) 500 MG tablet Take 1 tablet (500 mg total) by mouth 2 (two) times daily. 12/13/18 02/07/20  Margarita Mail, PA-C     Allergies  Allergen Reactions   Dexamethasone Shortness Of Breath and Other (See Comments)    Reaction:  Makes pt angry    Prednisone Other (See Comments)    Reaction:  Makes pt angry     History:   Past Medical History:  Diagnosis Date   Current smoker    History of substance use    Seizures ([place])      History reviewed. No pertinent surgical history.  Family History  Problem Relation Age of Onset   Seizures Father    Seizures Brother     Social History   Tobacco Use   Smoking status: Every Day    Packs/day: 1.00    Years: 10.00    Additional pack years: 0.00    Total pack years: 10.00    Types: Cigarettes   Smokeless tobacco: Never  Substance Use Topics   Alcohol use: Not Currently    Comment: rare use, but binges occasionally   Drug use: Yes    Types: Marijuana    Comment: every other day    Review of Systems  Constitutional:  Positive  for fatigue. Negative for fever.  HENT:  Positive for sore throat. Negative for ear pain.   Respiratory:  Positive for cough.   Gastrointestinal:  Negative for abdominal pain, nausea and vomiting.     Objective:   Vitals: BP 117/78 (BP Location: Left Arm)   Pulse 83   Temp 97.9 F (36.6 C)   Resp 17   SpO2 96%   Physical Exam Constitutional:      General: He is not in acute distress.    Appearance: Normal appearance. He is well-developed and normal weight. He is not ill-appearing or toxic-appearing.  HENT:     Head: Normocephalic and atraumatic.     Right Ear: Tympanic membrane and ear canal normal.     Left Ear: Tympanic membrane and ear canal normal.     Mouth/Throat:     Pharynx: Uvula midline. Pharyngeal swelling present.     Tonsils: No tonsillar exudate or tonsillar abscesses. 2+ on the right. 2+ on the left.  Cardiovascular:     Rate and Rhythm: Normal rate and regular rhythm.     Heart sounds: Normal heart sounds.  Pulmonary:     Effort: Pulmonary effort is normal.     Breath sounds: Normal breath sounds.     Comments: Clear to auscultation bilaterally  Abdominal:     General: Bowel sounds are normal.     Palpations: Abdomen is soft.     Tenderness: There is no abdominal tenderness.  Skin:    General: Skin is warm and dry.  Neurological:     General: No focal deficit present.     Mental Status: He is alert.  Psychiatric:        Mood and Affect: Mood and affect normal.     Results:  Labs: Results for orders placed or performed during the hospital encounter of 01/10/23 (from the past 24 hour(s))  POCT Rapid Strep A     Status: Abnormal   Collection Time: 01/10/23  6:02 PM  Result Value Ref Range   Streptococcus, Group A Screen (Direct) POSITIVE (A) NEGATIVE    Radiology: No results found.   UC Course/Treatments:  Procedures: Procedures   Medications Ordered in UC: Medications - No data to display   Assessment and Plan :     ICD-10-CM   1.  Streptococcal sore throat  J02.0        Streptococcal sore throat Afebrile, nontoxic-appearing, NAD. VSS. DDX includes but not limited to: Strep, mono, viral pharyngitis Strep was positive today in office. Augmentin was prescribed.  Recommend over-the-counter analgesics as needed for pain/fever as well as salt water gargles.  Strict ED precautions were given and patient verbalized understanding.   ED Discharge Orders          Ordered    amoxicillin-clavulanate (AUGMENTIN) 875-125 MG tablet  Every 12 hours        01/10/23 1820             PDMP not reviewed this encounter.     Carmie End, PA-C 01/10/23 1820

## 2023-01-24 ENCOUNTER — Emergency Department (HOSPITAL_COMMUNITY)
Admission: EM | Admit: 2023-01-24 | Discharge: 2023-01-24 | Disposition: A | Discharge status: Home / Self Care | Payer: Medicaid Other | Attending: Student | Admitting: Student

## 2023-01-24 ENCOUNTER — Other Ambulatory Visit: Payer: Self-pay

## 2023-01-24 ENCOUNTER — Encounter (HOSPITAL_COMMUNITY): Payer: Self-pay

## 2023-01-24 ENCOUNTER — Emergency Department (HOSPITAL_COMMUNITY): Payer: Medicaid Other

## 2023-01-24 DIAGNOSIS — R000 Tachycardia, unspecified: Secondary | ICD-10-CM | POA: Insufficient documentation

## 2023-01-24 DIAGNOSIS — R109 Unspecified abdominal pain: Secondary | ICD-10-CM | POA: Diagnosis not present

## 2023-01-24 DIAGNOSIS — F17210 Nicotine dependence, cigarettes, uncomplicated: Secondary | ICD-10-CM | POA: Diagnosis not present

## 2023-01-24 DIAGNOSIS — R1115 Cyclical vomiting syndrome unrelated to migraine: Secondary | ICD-10-CM | POA: Diagnosis not present

## 2023-01-24 DIAGNOSIS — D72829 Elevated white blood cell count, unspecified: Secondary | ICD-10-CM | POA: Diagnosis not present

## 2023-01-24 DIAGNOSIS — R112 Nausea with vomiting, unspecified: Secondary | ICD-10-CM | POA: Diagnosis present

## 2023-01-24 LAB — COMPREHENSIVE METABOLIC PANEL
ALT: 12 U/L (ref 0–44)
AST: 24 U/L (ref 15–41)
Albumin: 4.6 g/dL (ref 3.5–5.0)
Alkaline Phosphatase: 79 U/L (ref 38–126)
Anion gap: 11 (ref 5–15)
BUN: 23 mg/dL — ABNORMAL HIGH (ref 6–20)
CO2: 22 mmol/L (ref 22–32)
Calcium: 9.6 mg/dL (ref 8.9–10.3)
Chloride: 104 mmol/L (ref 98–111)
Creatinine, Ser: 0.86 mg/dL (ref 0.61–1.24)
GFR, Estimated: >60 mL/min (ref >60)
Glucose, Bld: 142 mg/dL — ABNORMAL HIGH (ref 70–99)
Potassium: 3.9 mmol/L (ref 3.5–5.1)
Sodium: 137 mmol/L (ref 135–145)
Total Bilirubin: 0.8 mg/dL (ref 0.3–1.2)
Total Protein: 7.9 g/dL (ref 6.5–8.1)

## 2023-01-24 LAB — CBC WITH DIFFERENTIAL/PLATELET
Abs Immature Granulocytes: 0.08 10*3/uL — ABNORMAL HIGH (ref 0.00–0.07)
Basophils Absolute: 0.1 10*3/uL (ref 0.0–0.1)
Basophils Relative: 0 %
Eosinophils Absolute: 0.1 10*3/uL (ref 0.0–0.5)
Eosinophils Relative: 0 %
HCT: 44.1 % (ref 39.0–52.0)
Hemoglobin: 15.3 g/dL (ref 13.0–17.0)
Immature Granulocytes: 0 %
Lymphocytes Relative: 9 %
Lymphs Abs: 1.8 10*3/uL (ref 0.7–4.0)
MCH: 30.7 pg (ref 26.0–34.0)
MCHC: 34.7 g/dL (ref 30.0–36.0)
MCV: 88.6 fL (ref 80.0–100.0)
Monocytes Absolute: 1.1 10*3/uL — ABNORMAL HIGH (ref 0.1–1.0)
Monocytes Relative: 6 %
Neutro Abs: 16.3 10*3/uL — ABNORMAL HIGH (ref 1.7–7.7)
Neutrophils Relative %: 85 %
Platelets: 275 10*3/uL (ref 150–400)
RBC: 4.98 MIL/uL (ref 4.22–5.81)
RDW: 13.4 % (ref 11.5–15.5)
WBC: 19.3 10*3/uL — ABNORMAL HIGH (ref 4.0–10.5)
nRBC: 0 % (ref 0.0–0.2)

## 2023-01-24 LAB — LIPASE, BLOOD: Lipase: 26 U/L (ref 11–51)

## 2023-01-24 MED ORDER — PROCHLORPERAZINE EDISYLATE 10 MG/2ML IJ SOLN
10.0000 mg | Freq: Once | INTRAMUSCULAR | Status: AC
  Administered 2023-01-24: 10 mg via INTRAVENOUS
  Filled 2023-01-24: qty 2

## 2023-01-24 MED ORDER — DIPHENHYDRAMINE HCL 50 MG/ML IJ SOLN
25.0000 mg | Freq: Once | INTRAMUSCULAR | Status: AC
  Administered 2023-01-24: 25 mg via INTRAVENOUS
  Filled 2023-01-24: qty 1

## 2023-01-24 MED ORDER — LACTATED RINGERS IV BOLUS
1000.0000 mL | Freq: Once | INTRAVENOUS | Status: AC
  Administered 2023-01-24: 1000 mL via INTRAVENOUS

## 2023-01-24 MED ORDER — FAMOTIDINE 20 MG PO TABS: 20.0000 mg | ORAL_TABLET | Freq: Two times a day (BID) | ORAL | 0 refills | Status: DC

## 2023-01-24 MED ORDER — ONDANSETRON HCL 4 MG/2ML IJ SOLN: 4.0000 mg | Freq: Once | INTRAMUSCULAR | Status: DC

## 2023-01-24 MED ORDER — ALUM & MAG HYDROXIDE-SIMETH 200-200-20 MG/5ML PO SUSP
30.0000 mL | Freq: Once | ORAL | Status: AC
  Administered 2023-01-24: 30 mL via ORAL
  Filled 2023-01-24: qty 30

## 2023-01-24 MED ORDER — LORAZEPAM 2 MG/ML IJ SOLN
0.5000 mg | Freq: Once | INTRAMUSCULAR | Status: AC
  Administered 2023-01-24: 0.5 mg via INTRAVENOUS
  Filled 2023-01-24: qty 1

## 2023-01-24 MED ORDER — LIDOCAINE VISCOUS HCL 2 % MT SOLN
15.0000 mL | Freq: Once | OROMUCOSAL | Status: AC
  Administered 2023-01-24: 15 mL via ORAL
  Filled 2023-01-24: qty 15

## 2023-01-24 MED ORDER — ONDANSETRON 4 MG PO TBDP: 4.0000 mg | ORAL_TABLET | Freq: Three times a day (TID) | ORAL | 0 refills | Status: DC | PRN

## 2023-01-24 MED ORDER — DROPERIDOL 2.5 MG/ML IJ SOLN
1.2500 mg | Freq: Once | INTRAMUSCULAR | Status: AC
  Administered 2023-01-24: 1.25 mg via INTRAVENOUS
  Filled 2023-01-24: qty 2

## 2023-01-24 NOTE — ED Notes (Signed)
Patient aware of need for urine specimen. Urinal at bedside. 

## 2023-01-24 NOTE — ED Triage Notes (Signed)
Patient reports started having vomiting and abd pain on Wednesday.  Reports he has issues with his stomach but never will get a diagnosis.  Patient reports smoking marijuana.

## 2023-01-24 NOTE — ED Provider Notes (Signed)
EMERGENCY DEPARTMENT AT [facility] Provider Note  CSN: [id number] Arrival date & time: 01/24/23 1038  Chief Complaint(s) Emesis  HPI Jaime Nixon is a 27 y.o. male with PMH seizure disorder, cannabinol hyperemesis, panic attacks who presents emergency department for evaluation of nausea vomiting abdominal pain.  Patient states that symptoms began 3 days ago and is last marijuana use was 5 days ago.  Endorses epigastric abdominal pain and frequent vomiting but denies chest pain, shortness of breath, headache, fever or other systemic symptoms.  Patient received Zofran and route by EMS but patient arrives with persistent vomiting.  Additional history obtained from wife who states that the patient does have significant right upper quadrant and epigastric pain after eating with associated vomiting and she has concerns for possible biliary pathology.   Past Medical History Past Medical History:  Diagnosis Date   Current smoker    History of substance use    Seizures ([place])    Patient Active Problem List   Diagnosis Date Noted   Facial cellulitis    Tooth infection 07/21/2022   Excessive use of nonsteroidal anti-inflammatory drugs (NSAIDs) 07/21/2022   Cannabis abuse    Severe episode of recurrent major depressive disorder, with psychotic features ([place]) 09/18/2016   Panic attacks 09/15/2016   Bradycardia    OD (overdose of drug), intentional self-harm, initial encounter ([place]) 09/10/2016   Bradycardia, drug induced 09/10/2016   Hypotension due to drugs 09/10/2016   Overdose 09/10/2016   Generalized abdominal pain    Hyperkalemia    Uncontrollable vomiting    Seizure disorder ([place]) 05/26/2016   Cannabinoid hyperemesis syndrome 05/26/2016   Hypokalemia due to loss of potassium 05/26/2016   Tobacco dependence 05/26/2016   Nausea and vomiting 05/26/2016   Leukocytosis 12/27/2015   Abnormal finding on MRI of brain    Pachymeningitis    Home  Medication(s) Prior to Admission medications   Medication Sig Start Date End Date Taking? Authorizing Provider  FLUoxetine (PROZAC) 20 MG capsule Take 1 capsule (20 mg total) by mouth daily. For depression Patient not taking: Reported on 02/24/2017 09/21/16 02/07/20  Lindell Spar I, NP  levETIRAcetam (KEPPRA) 500 MG tablet Take 1 tablet (500 mg total) by mouth 2 (two) times daily. 12/13/18 02/07/20  Margarita Mail, PA-C                                                                                                                                    Past Surgical History History reviewed. No pertinent surgical history. Family History Family History  Problem Relation Age of Onset   Seizures Father    Seizures Brother     Social History Social History   Tobacco Use   Smoking status: Every Day    Packs/day: 1.00    Years: 10.00    Additional pack years: 0.00    Total pack years: 10.00    Types: Cigarettes  Smokeless tobacco: Never  Substance Use Topics   Alcohol use: Not Currently    Comment: rare use, but binges occasionally   Drug use: Yes    Types: Marijuana    Comment: every other day   Allergies Dexamethasone and Prednisone  Review of Systems Review of Systems  Gastrointestinal:  Positive for abdominal pain, nausea and vomiting.    Physical Exam Vital Signs  I have reviewed the triage vital signs BP (!) 152/106 (BP Location: Right Arm)   Pulse 88   Temp 97.8 F (36.6 C) (Oral)   Resp (!) 24   Ht 6' (1.829 m)   Wt 88 kg   SpO2 100%   BMI 26.31 kg/m   Physical Exam Constitutional:      General: He is not in acute distress.    Appearance: Normal appearance. He is ill-appearing.  HENT:     Head: Normocephalic and atraumatic.     Nose: No congestion or rhinorrhea.  Eyes:     General:        Right eye: No discharge.        Left eye: No discharge.     Extraocular Movements: Extraocular movements intact.     Pupils: Pupils are equal, round, and reactive to  light.  Cardiovascular:     Rate and Rhythm: Normal rate and regular rhythm.     Heart sounds: No murmur heard. Pulmonary:     Effort: No respiratory distress.     Breath sounds: No wheezing or rales.  Abdominal:     General: There is no distension.     Tenderness: There is abdominal tenderness.  Musculoskeletal:        General: Normal range of motion.     Cervical back: Normal range of motion.  Skin:    General: Skin is warm and dry.  Neurological:     General: No focal deficit present.     Mental Status: He is alert.     ED Results and Treatments Labs (all labs ordered are listed, but only abnormal results are displayed) Labs Reviewed  CBC WITH DIFFERENTIAL/PLATELET - Abnormal; Notable for the following components:      Result Value   WBC 19.3 (*)    Neutro Abs 16.3 (*)    Monocytes Absolute 1.1 (*)    Abs Immature Granulocytes 0.08 (*)    All other components within normal limits  COMPREHENSIVE METABOLIC PANEL  LIPASE, BLOOD  URINALYSIS, ROUTINE W REFLEX MICROSCOPIC                                                                                                                          Radiology No results found.  Pertinent labs & imaging results that were available during my care of the patient were reviewed by me and considered in my medical decision making (see MDM for details).  Medications Ordered in ED Medications  lactated ringers bolus 1,000 mL (1,000 mLs Intravenous New Bag/Given 01/24/23 1111)  droperidol (  INAPSINE) 2.5 MG/ML injection 1.25 mg (1.25 mg Intravenous Given 01/24/23 1109)  LORazepam (ATIVAN) injection 0.5 mg (0.5 mg Intravenous Given 01/24/23 1108)                                                                                                                                     Procedures Procedures  (including critical care time)  Medical Decision Making / ED Course   This patient presents to the ED for concern of abdominal pain nausea  and vomiting, this involves an extensive number of treatment options, and is a complaint that carries with it a high risk of complications and morbidity.  The differential diagnosis includes cholecystitis, gastritis, cyclic vomiting syndrome, cannabinoid hyperemesis syndrome, abdominal migraine, intestinal obstruction, intra-abdominal abscess/infection  MDM: Patient seen emergency room for evaluation of abdominal pain nausea and vomiting.  Physical exam with tenderness in the epigastrium and a mild regular tachycardia but is otherwise unremarkable.  Laboratory evaluation with leukocytosis to 19.3 likely stress demargination in the setting of his current vomiting.  Right upper quadrant ultrasound unremarkable.  Patient received droperidol and Ativan and on reevaluation symptoms improved with the patient is still having some mild nausea and he was then given Compazine and Benadryl with fluid resuscitation and a GI cocktail.  On second reevaluation, symptoms much improved and given previous suspected history of cannabinoid hyperemesis syndrome and patient's persistent marijuana use, suspect this may be the underlying source of his vomiting today.  Patient was instructed to stop smoking marijuana and to follow-up outpatient with gastroenterology.  Vital signs improved in the emergency department and patient afebrile, and thus I have low suspicion for intra-abdominal infection.  Patient then discharged in the care of his wife with return precautions and outpatient gastroenterology follow-up.  I placed a referral to [facility] gastroenterology.   Additional history obtained: -Additional history obtained from wife -External records from outside source obtained and reviewed including: Chart review including previous notes, labs, imaging, consultation notes   Lab Tests: -I ordered, reviewed, and interpreted labs.   The pertinent results include:   Labs Reviewed  CBC WITH DIFFERENTIAL/PLATELET - Abnormal; Notable  for the following components:      Result Value   WBC 19.3 (*)    Neutro Abs 16.3 (*)    Monocytes Absolute 1.1 (*)    Abs Immature Granulocytes 0.08 (*)    All other components within normal limits  COMPREHENSIVE METABOLIC PANEL  LIPASE, BLOOD  URINALYSIS, ROUTINE W REFLEX MICROSCOPIC      Imaging Studies ordered: I ordered imaging studies including right upper quadrant ultrasound I independently visualized and interpreted imaging. I agree with the radiologist interpretation   Medicines ordered and prescription drug management: Meds ordered this encounter  Medications   DISCONTD: ondansetron (ZOFRAN) injection 4 mg   lactated ringers bolus 1,000 mL   droperidol (INAPSINE) 2.5 MG/ML injection 1.25 mg   LORazepam (ATIVAN) injection 0.5 mg    -  I have reviewed the patients home medicines and have made adjustments as needed  Critical interventions none    Cardiac Monitoring: The patient was maintained on a cardiac monitor.  I personally viewed and interpreted the cardiac monitored which showed an underlying rhythm of: NSR, sinus tachycardia  Social Determinants of Health:  Factors impacting patients care include: Continues to smoke marijuana   Reevaluation: After the interventions noted above, I reevaluated the patient and found that they have :improved  Co morbidities that complicate the patient evaluation  Past Medical History:  Diagnosis Date   Current smoker    History of substance use    Seizures ([place])       Dispostion: I considered admission for this patient, and with symptoms improved, patient safe for discharge with outpatient gastroenterology follow-up and return precautions     Final Clinical Impression(s) / ED Diagnoses Final diagnoses:  None     @PCDICTATION @    Teressa Lower, MD 01/24/23 1925

## 2023-01-31 ENCOUNTER — Encounter: Payer: Self-pay | Admitting: Student

## 2023-02-13 ENCOUNTER — Ambulatory Visit (HOSPITAL_COMMUNITY): Payer: Self-pay

## 2023-03-20 ENCOUNTER — Encounter (HOSPITAL_COMMUNITY): Payer: Self-pay

## 2023-03-20 ENCOUNTER — Ambulatory Visit (HOSPITAL_COMMUNITY)
Admission: RE | Admit: 2023-03-20 | Discharge: 2023-03-20 | Disposition: A | Discharge status: Home / Self Care | Payer: Medicaid Other | Source: Ambulatory Visit | Attending: Emergency Medicine | Admitting: Emergency Medicine

## 2023-03-20 VITALS — BP 132/71 | HR 83 | Temp 98.4°F | Resp 18

## 2023-03-20 DIAGNOSIS — L509 Urticaria, unspecified: Secondary | ICD-10-CM | POA: Diagnosis not present

## 2023-03-20 MED ORDER — DIPHENHYDRAMINE HCL 25 MG PO TABS: 25.0000 mg | ORAL_TABLET | Freq: Every evening | ORAL | 2 refills | Status: DC

## 2023-03-20 MED ORDER — CETIRIZINE HCL 10 MG PO TABS: 10.0000 mg | ORAL_TABLET | Freq: Every day | ORAL | 2 refills | Status: DC

## 2023-03-20 MED ORDER — FAMOTIDINE 20 MG PO TABS: 20.0000 mg | ORAL_TABLET | Freq: Every evening | ORAL | 2 refills | Status: DC

## 2023-03-20 NOTE — Discharge Instructions (Addendum)
I recommend the antihistamine regimen we discussed Zyrtec (cetirizine) once every morning Pepcid (famotidine) once every night, with or without Benadryl (diphenhydramine)  Try for at least a week.  I recommend scanning the QR code on the last page to get yourself established with a primary care provider

## 2023-03-20 NOTE — ED Provider Notes (Signed)
[facility]    CSN: [id number] Arrival date & time: 03/20/23  1902      History   Chief Complaint Chief Complaint  Patient presents with   Urticaria    HPI Jaime Nixon is a 27 y.o. male.  Reporting hives on and off for the last 2 weeks Notices them on arms, legs chest. They are itchy when they appear. 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 No sick contacts Denies shortness of breath, trouble breathing, oral swelling  Past Medical History:  Diagnosis Date   Current smoker    History of substance use    Seizures ([facility])     Patient Active Problem List   Diagnosis Date Noted   Facial cellulitis    Tooth infection 07/21/2022   Excessive use of nonsteroidal anti-inflammatory drugs (NSAIDs) 07/21/2022   Cannabis abuse    Severe episode of recurrent major depressive disorder, 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   Panic attacks 09/15/2016   Bradycardia    OD (overdose of drug), intentional self-harm, initial encounter ([facility]) 09/10/2016   Bradycardia, drug induced 09/10/2016   Hypotension due to drugs 09/10/2016   Overdose 09/10/2016   Generalized abdominal pain    Hyperkalemia    Uncontrollable vomiting    Seizure disorder ([facility]) 05/26/2016   Cannabinoid hyperemesis syndrome 05/26/2016   Hypokalemia due to loss of potassium 05/26/2016   Tobacco dependence 05/26/2016   Nausea and vomiting 05/26/2016   Leukocytosis 12/27/2015   Abnormal finding on MRI of brain    Pachymeningitis     History reviewed. No pertinent surgical history.     Home Medications    Prior to Admission medications   Medication Sig Start Date End Date Taking? Authorizing Provider  cetirizine (ZYRTEC ALLERGY) 10 MG tablet Take 1 tablet (10 mg total) by mouth daily. 03/20/23  Yes Edwyn Inclan, Lurena Joiner, PA-C  diphenhydrAMINE (BENADRYL) 25 MG tablet  Take 1 tablet (25 mg total) by mouth at bedtime. 03/20/23  Yes Danh Bayus, Lurena Joiner, PA-C  famotidine (PEPCID) 20 MG tablet Take 1 tablet (20 mg total) by mouth at bedtime. 03/20/23  Yes Keenen Roessner, Lurena Joiner, PA-C  ondansetron (ZOFRAN-ODT) 4 MG disintegrating tablet Take 1 tablet (4 mg total) by mouth every 8 (eight) hours as needed for nausea or vomiting. 01/24/23   Kommor, [place], MD  FLUoxetine (PROZAC) 20 MG capsule Take 1 capsule (20 mg total) by mouth daily. For depression Patient not taking: Reported on 02/24/2017 09/21/16 02/07/20  Armandina Stammer I, NP  levETIRAcetam (KEPPRA) 500 MG tablet Take 1 tablet (500 mg total) by mouth 2 (two) times daily. 12/13/18 02/07/20  Arthor Captain, PA-C    Family History Family History  Problem Relation Age of Onset   Seizures Father    Seizures Brother     Social History Social History   Tobacco Use   Smoking status: Every Day    Packs/day: 1.00    Years: 10.00    Additional pack years: 0.00    Total pack years: 10.00    Types: Cigarettes   Smokeless tobacco: Never  Substance Use Topics   Alcohol use: Not Currently    Comment: rare use, but binges occasionally   Drug use: Yes    Types: Marijuana    Comment: every other day     Allergies   Dexamethasone and Prednisone   Review of Systems Review of Systems  As per HPI  Physical Exam Triage Vital Signs ED Triage Vitals [03/20/23 1925]  Enc Vitals Group     BP 132/71     Pulse Rate 83     Resp 18     Temp 98.4 F (36.9 C)     Temp Source Oral     SpO2 98 %     Weight      Height      Head Circumference      Peak Flow      Pain Score 0     Pain Loc      Pain Edu?      Excl. in GC?    No data found.  Updated Vital Signs BP 132/71 (BP Location: Left Arm)   Pulse 83   Temp 98.4 F (36.9 C) (Oral)   Resp 18   SpO2 98%    Physical Exam Vitals and nursing note reviewed.  Constitutional:      General: He is not in acute distress.    Appearance: Normal appearance.  HENT:      Mouth/Throat:     Mouth: Mucous membranes are moist.     Pharynx: Oropharynx is clear. No posterior oropharyngeal erythema.  Eyes:     Conjunctiva/sclera: Conjunctivae normal.     Pupils: Pupils are equal, round, and reactive to light.  Cardiovascular:     Rate and Rhythm: Normal rate and regular rhythm.     Pulses: Normal pulses.     Heart sounds: Normal heart sounds.  Pulmonary:     Effort: Pulmonary effort is normal. No respiratory distress.     Breath sounds: Normal breath sounds. No wheezing.  Abdominal:     General: Bowel sounds are normal.     Tenderness: There is no abdominal tenderness.  Musculoskeletal:        General: Normal range of motion.     Cervical back: Normal range of motion.  Skin:    Findings: No rash.  Neurological:     Mental Status: He is alert and oriented to person, place, and time.      UC Treatments / Results  Labs (all labs ordered are listed, but only abnormal results are displayed) Labs Reviewed - No data to display  EKG   Radiology No results found.  Procedures Procedures (including critical care time)  Medications Ordered in UC Medications - No data to display  Initial Impression / Assessment and Plan / UC Course  I have reviewed the triage vital signs and the nursing notes.  Pertinent labs & imaging results that were available during my care of the patient were reviewed by me and considered in my medical decision making (see chart for details).  Recommend antihistamine regimen  Zyrtec daily, pepcid +/- benadryl Will try for a week Can return if persisting or worsening Recommend establish with PCP Work note provided  Final Clinical Impressions(s) / UC Diagnoses   Final diagnoses:  Urticaria     Discharge Instructions      I recommend the antihistamine regimen we discussed Zyrtec (cetirizine) once every morning Pepcid (famotidine) once every night, with or without Benadryl (diphenhydramine)  Try for at least a  week.  I recommend scanning the QR code on the last page to get yourself established with a primary care provider     ED Prescriptions     Medication Sig Dispense Auth. Provider   cetirizine (ZYRTEC ALLERGY) 10 MG tablet Take 1 tablet (10 mg total) by mouth daily. 30 tablet  Rockwell Zentz, Lurena Joiner, PA-C   famotidine (PEPCID) 20 MG tablet Take 1 tablet (20 mg total) by mouth at bedtime. 30 tablet Keasha Malkiewicz, PA-C   diphenhydrAMINE (BENADRYL) 25 MG tablet Take 1 tablet (25 mg total) by mouth at bedtime. 30 tablet Arbadella Kimbler, Lurena Joiner, PA-C      PDMP not reviewed this encounter.   Paxon Propes, Ray Church 03/20/23 2007

## 2023-03-20 NOTE — ED Triage Notes (Signed)
Pt reports intermittent hives that come and go on his arms, legs and chest. States this has been occurring for the past 2 weeks. Has used an topical itching spray for relief.  Denies any new encounters with food and detergents.

## 2023-03-27 ENCOUNTER — Other Ambulatory Visit: Payer: Self-pay

## 2023-03-27 ENCOUNTER — Ambulatory Visit (HOSPITAL_COMMUNITY)
Admission: RE | Admit: 2023-03-27 | Discharge: 2023-03-27 | Disposition: A | Discharge status: Home / Self Care | Payer: Medicaid Other | Source: Ambulatory Visit

## 2023-03-27 ENCOUNTER — Encounter (HOSPITAL_COMMUNITY): Payer: Self-pay

## 2023-03-27 VITALS — BP 115/69 | HR 76 | Temp 98.5°F | Resp 18 | Ht 72.0 in | Wt 194.0 lb

## 2023-03-27 DIAGNOSIS — S299XXA Unspecified injury of thorax, initial encounter: Secondary | ICD-10-CM | POA: Diagnosis not present
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
## 2023-03-27 NOTE — Discharge Instructions (Signed)
Your exam looks great in the clinic.  Follow-up as needed.

## 2023-03-27 NOTE — ED Provider Notes (Signed)
[facility]    CSN: [id number] Arrival date & time: 03/27/23  1652      History   Chief Complaint Chief Complaint  Patient presents with   Chest Injury    Entered by patient    HPI Jaime Nixon is a 27 y.o. male.   Patient presents to urgent care for evaluation of upper right chest pain after injury while he was at work 2 weeks ago. 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 His coworker caused the crane to "swing around faster than he thought" and one of the cross beams accidentally struck him in the chest. The cross beam struck him to the right upper side of the chest. This injury did not cause a bruise, laceration, or abrasion. States he started experiencing significant pain to the right upper chest with coughing, deep breathing, and any other activities causing increase in intraabdominal pressure. Pain has improved significantly over the last 2 weeks and is currently a 1/10. He no longer feels much pain, but would like to make sure that he does not have any broken ribs or injury to the lungs. He was using tylenol and ibuprofen at home but no longer needs this for pain. No shortness of breath, heart palpitations, or dizziness.      Past Medical History:  Diagnosis Date   Current smoker    History of substance use    Seizures (HCC)     Patient Active Problem List   Diagnosis Date Noted   Facial cellulitis    Tooth infection 07/21/2022   Excessive use of nonsteroidal anti-inflammatory drugs (NSAIDs) 07/21/2022   Cannabis abuse    Severe episode of recurrent major depressive disorder, 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   Panic attacks 09/15/2016   Bradycardia    OD (overdose of drug), intentional self-harm, initial encounter (HCC) 09/10/2016   Bradycardia, drug induced 09/10/2016   Hypotension due to drugs 09/10/2016   Overdose 09/10/2016   Generalized abdominal pain    Hyperkalemia    Uncontrollable vomiting    Seizure disorder (HCC)  05/26/2016   Cannabinoid hyperemesis syndrome 05/26/2016   Hypokalemia due to loss of potassium 05/26/2016   Tobacco dependence 05/26/2016   Nausea and vomiting 05/26/2016   Leukocytosis 12/27/2015   Abnormal finding on MRI of brain    Pachymeningitis     History reviewed. No pertinent surgical history.     Home Medications    Prior to Admission medications   Medication Sig Start Date End Date Taking? Authorizing Provider  cetirizine (ZYRTEC ALLERGY) 10 MG tablet Take 1 tablet (10 mg total) by mouth daily. 03/20/23   Rising, Lurena Joiner, PA-C  diphenhydrAMINE (BENADRYL) 25 MG tablet Take 1 tablet (25 mg total) by mouth at bedtime. 03/20/23   Rising, Lurena Joiner, PA-C  famotidine (PEPCID) 20 MG tablet Take 1 tablet (20 mg total) by mouth at bedtime. 03/20/23   Rising, Lurena Joiner, PA-C  ondansetron (ZOFRAN-ODT) 4 MG disintegrating tablet Take 1 tablet (4 mg total) by mouth every 8 (eight) hours as needed for nausea or vomiting. 01/24/23   Kommor, Madison, MD  FLUoxetine (PROZAC) 20 MG capsule Take 1 capsule (20 mg total) by mouth daily. For depression Patient not taking: Reported on 02/24/2017 09/21/16 02/07/20  Armandina Stammer I, NP  levETIRAcetam (KEPPRA) 500 MG tablet Take 1 tablet (500 mg total) by mouth 2 (two) times daily. 12/13/18 02/07/20  Arthor Captain, PA-C    Family History Family History  Problem Relation Age of Onset  Seizures Father    Seizures Brother     Social History Social History   Tobacco Use   Smoking status: Every Day    Packs/day: 1.00    Years: 10.00    Additional pack years: 0.00    Total pack years: 10.00    Types: Cigarettes   Smokeless tobacco: Never  Substance Use Topics   Alcohol use: Not Currently    Comment: rare use, but binges occasionally   Drug use: Yes    Types: Marijuana    Comment: every other day     Allergies   Dexamethasone and Prednisone   Review of Systems Review of Systems Per HPI  Physical Exam Triage Vital Signs ED Triage  Vitals  Enc Vitals Group     BP 03/27/23 1708 115/69     Pulse Rate 03/27/23 1708 76     Resp 03/27/23 1708 18     Temp 03/27/23 1708 98.5 F (36.9 C)     Temp Source 03/27/23 1708 Oral     SpO2 03/27/23 1708 99 %     Weight 03/27/23 1709 194 lb 0.1 oz (88 kg)     Height 03/27/23 1709 6' (1.829 m)     Head Circumference --      Peak Flow --      Pain Score 03/27/23 1709 1     Pain Loc --      Pain Edu? --      Excl. in GC? --    No data found.  Updated Vital Signs BP 115/69 (BP Location: Right Arm)   Pulse 76   Temp 98.5 F (36.9 C) (Oral)   Resp 18   Ht 6' (1.829 m)   Wt 194 lb 0.1 oz (88 kg)   SpO2 99%   BMI 26.31 kg/m   Visual Acuity Right Eye Distance:   Left Eye Distance:   Bilateral Distance:    Right Eye Near:   Left Eye Near:    Bilateral Near:     Physical Exam Vitals and nursing note reviewed.  Constitutional:      Appearance: He is not ill-appearing or toxic-appearing.  HENT:     Head: Normocephalic and atraumatic.     Right Ear: Hearing and external ear normal.     Left Ear: Hearing and external ear normal.     Nose: Nose normal.     Mouth/Throat:     Lips: Pink.  Eyes:     General: Lids are normal. Vision grossly intact. Gaze aligned appropriately.     Extraocular Movements: Extraocular movements intact.     Conjunctiva/sclera: Conjunctivae normal.  Cardiovascular:     Rate and Rhythm: Normal rate and regular rhythm.     Heart sounds: Normal heart sounds, S1 normal and S2 normal.  Pulmonary:     Effort: Pulmonary effort is normal. No respiratory distress.     Breath sounds: Normal breath sounds and air entry. No stridor. No wheezing, rhonchi or rales.     Comments: No tenderness to the chest wall. No ecchymosis or signs of injury. Chest:     Chest wall: No tenderness.  Musculoskeletal:     Cervical back: Neck supple.  Skin:    General: Skin is warm and dry.     Capillary Refill: Capillary refill takes less than 2 seconds.      Findings: No rash.  Neurological:     General: No focal deficit present.     Mental Status: He is alert and  oriented to person, place, and time. Mental status is at baseline.     Cranial Nerves: No dysarthria or facial asymmetry.  Psychiatric:        Mood and Affect: Mood normal.        Speech: Speech normal.        Behavior: Behavior normal.        Thought Content: Thought content normal.        Judgment: Judgment normal.      UC Treatments / Results  Labs (all labs ordered are listed, but only abnormal results are displayed) Labs Reviewed - No data to display  EKG   Radiology No results found.  Procedures Procedures (including critical care time)  Medications Ordered in UC Medications - No data to display  Initial Impression / Assessment and Plan / UC Course  I have reviewed the triage vital signs and the nursing notes.  Pertinent labs & imaging results that were available during my care of the patient were reviewed by me and considered in my medical decision making (see chart for details).   1.  Chest injury Musculoskeletal exam is stable, therefore deferred imaging.  Lungs are clear.  Low suspicion for fracture/dislocation/pneumothorax etc. related to recent injury while at work.  Symptoms have fully resolved.  He is nontoxic in appearance with hemodynamically stable vital signs.  May follow-up with primary care as needed.  Discussed physical exam and available lab work findings in clinic with patient.  Counseled patient regarding appropriate use of medications and potential side effects for all medications recommended or prescribed today. Discussed red flag signs and symptoms of worsening condition,when to call the PCP office, return to urgent care, and when to seek higher level of care in the emergency department. Patient verbalizes understanding and agreement with plan. All questions answered. Patient discharged in stable condition.    Final Clinical Impressions(s)  / UC Diagnoses   Final diagnoses:  Chest injury, initial encounter     Discharge Instructions      Your exam looks great in the clinic.  Follow-up as needed.     ED Prescriptions   None    PDMP not reviewed this encounter.   Carlisle Beers, [place] 03/27/23 1805

## 2023-05-15 ENCOUNTER — Ambulatory Visit (HOSPITAL_COMMUNITY)
Admission: EM | Admit: 2023-05-15 | Discharge: 2023-05-16 | Disposition: A | Discharge status: Other Acute Inpatient Hospital | Payer: 59 | Attending: Behavioral Health | Admitting: Behavioral Health

## 2023-05-15 ENCOUNTER — Other Ambulatory Visit: Payer: Self-pay

## 2023-05-15 DIAGNOSIS — F339 Major depressive disorder, recurrent, unspecified: Secondary | ICD-10-CM | POA: Insufficient documentation

## 2023-05-15 DIAGNOSIS — R45851 Suicidal ideations: Secondary | ICD-10-CM | POA: Insufficient documentation

## 2023-05-15 DIAGNOSIS — F17200 Nicotine dependence, unspecified, uncomplicated: Secondary | ICD-10-CM | POA: Diagnosis not present

## 2023-05-15 DIAGNOSIS — F1990 Other psychoactive substance use, unspecified, uncomplicated: Secondary | ICD-10-CM | POA: Diagnosis not present

## 2023-05-15 DIAGNOSIS — R9431 Abnormal electrocardiogram [ECG] [EKG]: Secondary | ICD-10-CM | POA: Diagnosis not present

## 2023-05-15 DIAGNOSIS — Z9151 Personal history of suicidal behavior: Secondary | ICD-10-CM | POA: Diagnosis not present

## 2023-05-15 DIAGNOSIS — F329 Major depressive disorder, single episode, unspecified: Secondary | ICD-10-CM | POA: Diagnosis present

## 2023-05-15 DIAGNOSIS — F1290 Cannabis use, unspecified, uncomplicated: Secondary | ICD-10-CM | POA: Diagnosis not present

## 2023-05-15 LAB — LIPID PANEL
Cholesterol: 232 mg/dL — ABNORMAL HIGH (ref 0–200)
HDL: 59 mg/dL (ref >40)
LDL Cholesterol: 149 mg/dL — ABNORMAL HIGH (ref 0–99)
Total CHOL/HDL Ratio: 3.9 RATIO
Triglycerides: 121 mg/dL (ref <150)
VLDL: 24 mg/dL (ref 0–40)

## 2023-05-15 LAB — POCT URINE DRUG SCREEN - MANUAL ENTRY (I-SCREEN)
POC Amphetamine UR: NOT DETECTED — NL
POC Buprenorphine (BUP): NOT DETECTED — NL
POC Cocaine UR: NOT DETECTED — NL
POC Marijuana UR: POSITIVE — AB
POC Methadone UR: NOT DETECTED — NL
POC Methamphetamine UR: NOT DETECTED — NL
POC Morphine: NOT DETECTED — NL
POC Oxazepam (BZO): POSITIVE — AB
POC Oxycodone UR: NOT DETECTED — NL
POC Secobarbital (BAR): NOT DETECTED — NL

## 2023-05-15 LAB — COMPREHENSIVE METABOLIC PANEL
ALT: 16 U/L (ref 0–44)
AST: 27 U/L (ref 15–41)
Albumin: 5 g/dL (ref 3.5–5.0)
Alkaline Phosphatase: 73 U/L (ref 38–126)
Anion gap: 16 — ABNORMAL HIGH (ref 5–15)
BUN: 15 mg/dL (ref 6–20)
CO2: 21 mmol/L — ABNORMAL LOW (ref 22–32)
Calcium: 10.4 mg/dL — ABNORMAL HIGH (ref 8.9–10.3)
Chloride: 104 mmol/L (ref 98–111)
Creatinine, Ser: 0.78 mg/dL (ref 0.61–1.24)
GFR, Estimated: >60 mL/min (ref >60)
Glucose, Bld: 81 mg/dL (ref 70–99)
Potassium: 4.3 mmol/L (ref 3.5–5.1)
Sodium: 141 mmol/L (ref 135–145)
Total Bilirubin: 0.7 mg/dL (ref 0.3–1.2)
Total Protein: 7.7 g/dL (ref 6.5–8.1)

## 2023-05-15 LAB — CBC WITH DIFFERENTIAL/PLATELET
Abs Immature Granulocytes: 0.08 10*3/uL — ABNORMAL HIGH (ref 0.00–0.07)
Basophils Absolute: 0.1 10*3/uL (ref 0.0–0.1)
Basophils Relative: 0 %
Eosinophils Absolute: 0.1 10*3/uL (ref 0.0–0.5)
Eosinophils Relative: 0 %
HCT: 46.2 % (ref 39.0–52.0)
Hemoglobin: 15.5 g/dL (ref 13.0–17.0)
Immature Granulocytes: 1 %
Lymphocytes Relative: 15 %
Lymphs Abs: 2.2 10*3/uL (ref 0.7–4.0)
MCH: 29.7 pg (ref 26.0–34.0)
MCHC: 33.5 g/dL (ref 30.0–36.0)
MCV: 88.5 fL (ref 80.0–100.0)
Monocytes Absolute: 0.8 10*3/uL (ref 0.1–1.0)
Monocytes Relative: 5 %
Neutro Abs: 11.5 10*3/uL — ABNORMAL HIGH (ref 1.7–7.7)
Neutrophils Relative %: 79 %
Platelets: 236 10*3/uL (ref 150–400)
RBC: 5.22 MIL/uL (ref 4.22–5.81)
RDW: 13.8 % (ref 11.5–15.5)
WBC: 14.7 10*3/uL — ABNORMAL HIGH (ref 4.0–10.5)
nRBC: 0 % (ref 0.0–0.2)

## 2023-05-15 LAB — TSH: TSH: 1.645 u[IU]/mL (ref 0.350–4.500)

## 2023-05-15 LAB — HEMOGLOBIN A1C
Hgb A1c MFr Bld: 5.5 % (ref 4.8–5.6)
Mean Plasma Glucose: 111.15 mg/dL

## 2023-05-15 LAB — ETHANOL: Alcohol, Ethyl (B): <10 mg/dL (ref <10)

## 2023-05-15 MED ORDER — LORAZEPAM 1 MG PO TABS: 1.0000 mg | ORAL_TABLET | Freq: Two times a day (BID) | ORAL | Status: DC

## 2023-05-15 MED ORDER — LORAZEPAM 1 MG PO TABS: 1.0000 mg | ORAL_TABLET | Freq: Every day | ORAL | Status: DC

## 2023-05-15 MED ORDER — LORAZEPAM 1 MG PO TABS: 1.0000 mg | ORAL_TABLET | Freq: Three times a day (TID) | ORAL | Status: DC

## 2023-05-15 MED ORDER — ACETAMINOPHEN 325 MG PO TABS: 650.0000 mg | ORAL_TABLET | Freq: Four times a day (QID) | ORAL | Status: DC | PRN

## 2023-05-15 MED ORDER — TRAZODONE HCL 50 MG PO TABS
50.0000 mg | ORAL_TABLET | Freq: Every evening | ORAL | Status: DC | PRN
  Administered 2023-05-15: 50 mg via ORAL
  Filled 2023-05-15: qty 1

## 2023-05-15 MED ORDER — HYDROXYZINE HCL 25 MG PO TABS
25.0000 mg | ORAL_TABLET | Freq: Three times a day (TID) | ORAL | Status: DC | PRN
  Administered 2023-05-15: 25 mg via ORAL
  Filled 2023-05-15: qty 1

## 2023-05-15 MED ORDER — LORAZEPAM 1 MG PO TABS
1.0000 mg | ORAL_TABLET | Freq: Four times a day (QID) | ORAL | Status: DC
  Administered 2023-05-15 – 2023-05-16 (×3): 1 mg via ORAL
  Filled 2023-05-15 (×3): qty 1

## 2023-05-15 MED ORDER — MAGNESIUM HYDROXIDE 400 MG/5ML PO SUSP: 30.0000 mL | Freq: Every day | ORAL | Status: DC | PRN

## 2023-05-15 MED ORDER — ADULT MULTIVITAMIN W/MINERALS CH
1.0000 | ORAL_TABLET | Freq: Every day | ORAL | Status: DC
  Administered 2023-05-15 – 2023-05-16 (×2): 1 via ORAL
  Filled 2023-05-15 (×3): qty 1

## 2023-05-15 MED ORDER — THIAMINE HCL 100 MG/ML IJ SOLN
100.0000 mg | Freq: Once | INTRAMUSCULAR | Status: AC
  Administered 2023-05-15: 100 mg via INTRAMUSCULAR
  Filled 2023-05-15: qty 2

## 2023-05-15 MED ORDER — LOPERAMIDE HCL 2 MG PO CAPS: 2.0000 mg | ORAL_CAPSULE | ORAL | Status: DC | PRN

## 2023-05-15 MED ORDER — LORAZEPAM 1 MG PO TABS: 1.0000 mg | ORAL_TABLET | Freq: Four times a day (QID) | ORAL | Status: DC | PRN

## 2023-05-15 MED ORDER — ALUM & MAG HYDROXIDE-SIMETH 200-200-20 MG/5ML PO SUSP: 30.0000 mL | ORAL | Status: DC | PRN

## 2023-05-15 MED ORDER — ONDANSETRON 4 MG PO TBDP: 4.0000 mg | ORAL_TABLET | Freq: Four times a day (QID) | ORAL | Status: DC | PRN

## 2023-05-15 MED ORDER — NICOTINE 21 MG/24HR TD PT24
21.0000 mg | MEDICATED_PATCH | Freq: Every day | TRANSDERMAL | Status: DC
  Administered 2023-05-15 – 2023-05-16 (×2): 21 mg via TRANSDERMAL
  Filled 2023-05-15 (×2): qty 1

## 2023-05-15 MED ORDER — THIAMINE MONONITRATE 100 MG PO TABS
100.0000 mg | ORAL_TABLET | Freq: Every day | ORAL | Status: DC
  Administered 2023-05-16: 100 mg via ORAL
  Filled 2023-05-15: qty 1

## 2023-05-15 NOTE — ED Notes (Addendum)
Pt oriented to unit, and he is calm and cooperative. Pt took a shower and ate dinner. Pt is currently asleep on his chair/bed with even and unlabored respirations. No distress/discomfort noted. Monitoring will continue. Pt is safe on unit.

## 2023-05-15 NOTE — ED Notes (Signed)
Patient observed/assessed in bed/chair resting quietly appearing in no distress and verbalizing no complaints at this time. Will continue to monitor.  

## 2023-05-15 NOTE — ED Notes (Signed)
Patient observed/assessed on unit floor. Patient lying in bed but awake upon approach and responded to minimal verbal stimulation. Patient's eye contact is appropriate to scenario and speech is low but tangible. Patient denies Thoughts of S/I and H/I this time. Denies A/V/H. Patient states that appetite has been good and had a BM today without issue. Patient states that he does feel anxious given the scenario including being on the unit and being currently admitted. Patient's anxiety seemingly reduced at being notified that patient was accepted to [facility]. Patient signed Voluntary consent form and it was faxed to [facility] transfer is pending due to his blood work not resulting yet. Patient was offered snack/fluids. Will continue to monitor/support.

## 2023-05-15 NOTE — ED Notes (Signed)
Patient arrived on unit being oriented by staff. Patient given meal. Patient given items for shower. Patient safe on unit with continued monitoring.

## 2023-05-15 NOTE — ED Provider Notes (Addendum)
[facility] Continuous Assessment Admission H&P  Date: 05/15/23 Patient Name: Jaime Nixon MRN: [id number] Chief Complaint:   Diagnoses:  Final diagnoses:  Episode of recurrent major depressive disorder, unspecified depression episode severity (HCC)  Suicidal ideation    HPI: Jaime Nixon is a 27 year old male patient with a past psychiatric history significant for MDD, substance abuse, and panic attacks who presented to the [facility] behavioral health urgent care voluntary accompanied by law enforcement after his wife called 911 due to patient busting out his car windows because he was angry.  Patient seen and evaluated face-to-face by this provider, and chart reviewed. On evaluation, patient is alert and oriented x 4. His thought process is linear and speech is clear and coherent. His mood is depressed and affect is congruent. He has fair eye contact.  He appears casually dressed. He is calm and cooperative and does not appear to be in acute distress. Patient states that his wife called the police because he flipped out, lost his cool and busted out his car windows. He states that he had an altercation with an old friend (male) yesterday that carried over to today. He states that he woke up in a mood  today and progressively got worse. He states that the police came to talk to him and he told them that he needed mental health treatment. He states that he has been feeling pretty suicidal and having a lot of emotional problems for the past 6 to 7 years. He states that for the past 3 years the suicidal thoughts have been growing. He currently endorses suicidal ideations with a plan to obtain a gun and shoot himself and states, "that is the quickest way." He denies access to firearms. He reports 3 past suicide attempts. He denies self-injurious behaviors. He denies homicidal ideations. He describes his current mood as feeling "numb." He endorses depressive symptoms of feeling down, angry,  irritable, unhappy, worthless, and hopeless. He reports poor sleep. On average, he reports sleeping 3 to 4 hours per night. .He reports having occasional nightmares due to not finding out his father was dead for 2 weeks, and his grandmother passed away while he was incarcerated. He reports a fair appetite. He reports vaping nicotine daily. He denies drinking alcohol. He reports daily marijuana use, on average he smokes 3 g/day. He reports using Xanax 1 mg once yesterday. He states yesterday was the first time he has used Xanax in a long time. He reports using Xanax maybe 5 times in the past year. He reports a past substance abuse history of abusing heroin, methamphetamines, coke, acid, and Xanax. He reports a medical history of seizures and states that he not taking any medications for seizures in the last 7 years for seizures. He reports that his last seizure was 3 years ago. He states that he occasionally will take Xanax to help him calm down so that he does not go into a seizure. He denies physical complaints at this time. He denies outpatient psychiatry or therapy at this time. He denies taking prescribed medications.    Total Time spent with patient: 45 minutes  Musculoskeletal  Strength & Muscle Tone: within normal limits Gait & Station: normal Patient leans: N/A  Psychiatric Specialty Exam  Presentation General Appearance:  Appropriate for Environment  Eye Contact: Fair  Speech: Clear and Coherent  Speech Volume: Normal  Handedness: Right   Mood and Affect  Mood: Depressed  Affect: Congruent   Thought Process  Thought Processes:  Coherent  Descriptions of Associations:Intact  Orientation:Full (Time, Place and Person)  Thought Content:Logical    Hallucinations:Hallucinations: None  Ideas of Reference:None  Suicidal Thoughts:Suicidal Thoughts: Yes, Active SI Active Intent and/or Plan: With Plan; With Intent; Without Means to Carry Out  Homicidal  Thoughts:Homicidal Thoughts: No   Sensorium  Memory: Immediate Fair; Recent Fair; Remote Fair  Judgment: Intact  Insight: Present   Executive Functions  Concentration: Fair  Attention Span: Fair  Recall: [employer] of Knowledge: Fair  Language: Fair   Psychomotor Activity  Psychomotor Activity: Psychomotor Activity: Normal   Assets  Assets: Communication Skills; Desire for Improvement; Housing; Leisure Time; Physical Health; Intimacy; Social Support   Sleep  Sleep: Sleep: Poor Number of Hours of Sleep: 3   Nutritional Assessment (For OBS and FBC admissions only) Has the patient had a weight loss or gain of 10 pounds or more in the last 3 months?: No Has the patient had a decrease in food intake/or appetite?: No Does the patient have dental problems?: No Does the patient have eating habits or behaviors that may be indicators of an eating disorder including binging or inducing vomiting?: No Has the patient recently lost weight without trying?: 0 Has the patient been eating poorly because of a decreased appetite?: 0 Malnutrition Screening Tool Score: 0    Physical Exam HENT:     Nose: Nose normal.  Cardiovascular:     Rate and Rhythm: Normal rate.  Pulmonary:     Effort: Pulmonary effort is normal.  Musculoskeletal:        General: Normal range of motion.     Cervical back: Normal range of motion.  Neurological:     Mental Status: He is alert and oriented to person, place, and time.    Review of Systems  Constitutional: Negative.   HENT: Negative.    Eyes: Negative.   Respiratory: Negative.    Cardiovascular: Negative.   Gastrointestinal: Negative.   Genitourinary: Negative.   Musculoskeletal: Negative.   Neurological: Negative.   Endo/Heme/Allergies: Negative.   Psychiatric/Behavioral:  Positive for depression, substance abuse and suicidal ideas. The patient has insomnia.     Blood pressure 119/81, pulse 88, temperature 98.6 F (37  C), temperature source Oral, resp. rate 18, SpO2 98%. There is no height or weight on file to calculate BMI.  Past Psychiatric History: History of MDD, substance abuse, and panic attacks. Patient reports three past suicide attempts by to overdoses twice and attempting to hang himself once. Patient hospitalized at [facility] from 09/18/2016 to 09/20/2016  Is the patient at risk to self? Yes  Has the patient been a risk to self in the past 6 months? Yes .    Has the patient been a risk to self within the distant past? Yes   Is the patient a risk to others? Yes   Has the patient been a risk to others in the past 6 months? No   Has the patient been a risk to others within the distant past? No   Past Medical History: History of seizures. Last reported seizure three years ago. Patient denies taking anti seizures medications for the last 7 years.    Family History: History of (p) great-grandfather committed suicide by shooting himself, (p) grandfather committed suicide by drinking himself to death, dad has a history of depression and (p) grandmother has a history of depression.  Social History: Patient resides with his wife and 2 biological sons ages 11 and 80 years old.  Patient denies access to firearms in the home. Patient denies current legal issues but reports past legal issues, incarcerated six years ago for assault.   Last Labs:  Admission on 01/24/2023, Discharged on 01/24/2023  Component Date Value Ref Range Status   WBC 01/24/2023 19.3 (H)  4.0 - 10.5 K/uL Final   RBC 01/24/2023 4.98  4.22 - 5.81 MIL/uL Final   Hemoglobin 01/24/2023 15.3  13.0 - 17.0 g/dL Final   HCT 11/91/4782 44.1  39.0 - 52.0 % Final   MCV 01/24/2023 88.6  80.0 - 100.0 fL Final   MCH 01/24/2023 30.7  26.0 - 34.0 pg Final   MCHC 01/24/2023 34.7  30.0 - 36.0 g/dL Final   RDW 95/62/1308 13.4  11.5 - 15.5 % Final   Platelets 01/24/2023 275  150 - 400 K/uL Final   nRBC 01/24/2023 0.0  0.0 - 0.2 %  Final   Neutrophils Relative % 01/24/2023 85  % Final   Neutro Abs 01/24/2023 16.3 (H)  1.7 - 7.7 K/uL Final   Lymphocytes Relative 01/24/2023 9  % Final   Lymphs Abs 01/24/2023 1.8  0.7 - 4.0 K/uL Final   Monocytes Relative 01/24/2023 6  % Final   Monocytes Absolute 01/24/2023 1.1 (H)  0.1 - 1.0 K/uL Final   Eosinophils Relative 01/24/2023 0  % Final   Eosinophils Absolute 01/24/2023 0.1  0.0 - 0.5 K/uL Final   Basophils Relative 01/24/2023 0  % Final   Basophils Absolute 01/24/2023 0.1  0.0 - 0.1 K/uL Final   Immature Granulocytes 01/24/2023 0  % Final   Abs Immature Granulocytes 01/24/2023 0.08 (H)  0.00 - 0.07 K/uL Final   Performed at [facility], [address] [place], [place] [address]   Sodium 01/24/2023 137  135 - 145 mmol/L Final   Potassium 01/24/2023 3.9  3.5 - 5.1 mmol/L Final   Chloride 01/24/2023 104  98 - 111 mmol/L Final   CO2 01/24/2023 22  22 - 32 mmol/L Final   Glucose, Bld 01/24/2023 142 (H)  70 - 99 mg/dL Final   Glucose reference range applies only to samples taken after fasting for at least 8 hours.   BUN 01/24/2023 23 (H)  6 - 20 mg/dL Final   Creatinine, Ser 01/24/2023 0.86  0.61 - 1.24 mg/dL Final   Calcium 69/62/9528 9.6  8.9 - 10.3 mg/dL Final   Total Protein 41/32/4401 7.9  6.5 - 8.1 g/dL Final   Albumin 02/72/5366 4.6  3.5 - 5.0 g/dL Final   AST 44/12/4740 24  15 - 41 U/L Final   ALT 01/24/2023 12  0 - 44 U/L Final   Alkaline Phosphatase 01/24/2023 79  38 - 126 U/L Final   Total Bilirubin 01/24/2023 0.8  0.3 - 1.2 mg/dL Final   GFR, Estimated 01/24/2023 >60  >60 mL/min Final   Comment: (NOTE) Calculated using the CKD-EPI Creatinine Equation (2021)    Anion gap 01/24/2023 11  5 - 15 Final   Performed at [facility], [address] [place], [place] [address]   Lipase 01/24/2023 26  11 - 51 U/L Final   Performed at [facility], [address] [place], [place] [address]  Admission on  01/10/2023, Discharged on 01/10/2023  Component Date Value Ref Range Status   Streptococcus, Group A Screen (Dir* 01/10/2023 POSITIVE (A)  NEGATIVE Final    Allergies: Dexamethasone and Prednisone  Medications:  Facility Ordered Medications  Medication   acetaminophen (TYLENOL) tablet  650 mg   alum & mag hydroxide-simeth (MAALOX/MYLANTA) 200-200-20 MG/5ML suspension 30 mL   magnesium hydroxide (MILK OF MAGNESIA) suspension 30 mL   hydrOXYzine (ATARAX) tablet 25 mg   traZODone (DESYREL) tablet 50 mg   thiamine (VITAMIN B1) injection 100 mg   [START ON 05/16/2023] thiamine (VITAMIN B1) tablet 100 mg   multivitamin with minerals tablet 1 tablet   LORazepam (ATIVAN) tablet 1 mg   loperamide (IMODIUM) capsule 2-4 mg   ondansetron (ZOFRAN-ODT) disintegrating tablet 4 mg   LORazepam (ATIVAN) tablet 1 mg   Followed by   Melene Muller ON 05/17/2023] LORazepam (ATIVAN) tablet 1 mg   Followed by   Melene Muller ON 05/18/2023] LORazepam (ATIVAN) tablet 1 mg   Followed by   Melene Muller ON 05/19/2023] LORazepam (ATIVAN) tablet 1 mg   PTA Medications  Medication Sig   ondansetron (ZOFRAN-ODT) 4 MG disintegrating tablet Take 1 tablet (4 mg total) by mouth every 8 (eight) hours as needed for nausea or vomiting.   cetirizine (ZYRTEC ALLERGY) 10 MG tablet Take 1 tablet (10 mg total) by mouth daily.   famotidine (PEPCID) 20 MG tablet Take 1 tablet (20 mg total) by mouth at bedtime.   diphenhydrAMINE (BENADRYL) 25 MG tablet Take 1 tablet (25 mg total) by mouth at bedtime.      Medical Decision Making  Patient is recommended for inpatient psychiatric treatment for safety and mood stabilization for active SI and depressive symptoms. Patient admitted to the [facility] behavioral health urgent care continuous assessment while he awaits inpatient psychiatric placement. Patient was a voluntary.  Will start Ativan taper due to recent and history of benzodiazepine abuse and to  reduce risk for seizures due to hx of  seizures.   LORazepam  1 mg Oral QID   Followed by   Melene Muller ON 05/17/2023] LORazepam  1 mg Oral TID   Followed by   Melene Muller ON 05/18/2023] LORazepam  1 mg Oral BID   Followed by   Melene Muller ON 05/19/2023] LORazepam  1 mg Oral Daily   multivitamin with minerals  1 tablet Oral Daily   thiamine  100 mg Intramuscular Once   [START ON 05/16/2023] thiamine  100 mg Oral Daily    Lab Orders         CBC with Differential/Platelet         Comprehensive metabolic panel         Hemoglobin A1c         Ethanol         Lipid panel         TSH         Hepatitis panel, acute         RPR         HIV Antibody (routine testing w rflx)         POCT Urine Drug Screen - (I-Screen)    EKG    Recommendations  Based on my evaluation the patient does not appear to have an emergency medical condition.  Layla Barter, NP 05/15/23  5:10 PM

## 2023-05-15 NOTE — BH Assessment (Signed)
Comprehensive Clinical Assessment (CCA) Note   05/15/2023 ROLLYN SCIALDONE [id number]  Disposition: Liborio Nixon, NP recommends inpatient hospitalization.   The patient demonstrates the following risk factors for suicide: Chronic risk factors for suicide include: previous suicide attempts   . Acute risk factors for suicide include: family or marital conflict. Protective factors for this patient include: positive social support. Considering these factors, the overall suicide risk at this point appears to be moderate. Patient is not appropriate for outpatient follow up.   Pt is a 27 yo male who was brought in by [facility] voluntarily after pt's wife called 911 due to pt have busting out his car window because he was angry. Pt reports that he is struggling with controlling his emotions. Pt reports have thoughts of SI. Pt reports having "many plans" but shared he has recently thought out doing it "the easy way" which is getting a gun and shooting self. Pt denies access to guns and weapons. Pt reports that he is unsure of his mental health diagnosis. Pt reports that he has hx of SI attempts with the most recent attempt was 6 years ago. Pt reports that at that time he attempted to overdose on pills and hang himself. Pt reports that he was at [facility] approx. 10 years ago. Pt denies HI and AVH. Pt reports smoking marijuana daily. Pt reported that he took one Xanax yesterday. Pt does not have any outpatient services at this time.  Pt currently lives with his wife and 2 sons ( 2 yo and 29 yo.). 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 Pt was casually dressed and groomed appropriately. Pt was alert and cooperative. Pt is dressed unremarkably, alert, oriented x4 with normal speech and normal motor behavior. Eye contact is good. Pt's mood is depressed, and affect is anxious. Thought process is coherent and relevant. Pt's insight is fair and judgement is poor. There is no indication pt is currently  responding to internal stimuli or experiencing delusional thought content. Pt was cooperative throughout assessment.    Chief Complaint: Suicidal Ideation  Visit Diagnosis:  Major Depressive Disorder     CCA Screening, Triage and Referral (STR)  Patient Reported Information How did you hear about [place]? Legal System  What Is the Reason for Your Visit/Call Today? Pt is a 27 yo male who was brought in by [facility] voluntarily after pt's wife called 911 due to pt have busting out his car window because he was angry. Pt reports that he is struggling with controlling his emotions. Pt reports have thoughts of SI. Pt reports having "many plans" but shared he has recently thought out doing it "the easy way" which is getting a gun and shooting self. Pt denies access to guns and weapons. Pt reports that he is unsure of his mental health diagnosis. Pt reports that he has hx of SI attempts with the most recent attempt was 6 years ago. Pt reports that at that time he attempted to overdose on pills and hang himself. Pt reports that he was at [facility] approx. 10 years ago. Pt denies HI and AVH. Pt reports smoking marijuana daily. Pt reported that he took one Xanax yesterday. Pt does not have any outpatient services at this time.  How Long Has This Been Causing You Problems? 1 wk - 1 month  What Do You Feel Would Help You the Most Today? Treatment for Depression or other mood problem; Medication(s)   Have You Recently Had Any Thoughts About  Hurting Yourself? Yes  Are You Planning to Commit Suicide/Harm Yourself At This time? No   Flowsheet Row ED from 05/15/2023 in [facility] ED from 03/27/2023 in [facility] at [facility] ED from 03/20/2023 in [facility] Ur[facility] Care at [facility] RISK CATEGORY Moderate Risk No Risk No Risk       Have you Recently Had Thoughts About Hurting Someone Karolee Ohs? No  Are You Planning to Harm Someone at This Time? No  Explanation: Pt  denies HI at this time   Have You Used Any Alcohol or Drugs in the Past 24 Hours? Yes  What Did You Use and How Much? Marijuana (3 grams) and Xanax 1 mg   Do You Currently Have a Therapist/Psychiatrist? No  Name of Therapist/Psychiatrist: Name of Therapist/Psychiatrist: n/a   Have You Been Recently Discharged From Any Office Practice or Programs? No  Explanation of Discharge From Practice/Program: n/a     CCA Screening Triage Referral Assessment Type of Contact: Face-to-Face  Telemedicine Service Delivery:   Is this Initial or Reassessment?   Date Telepsych consult ordered in [facility]:    Time Telepsych consult ordered in [facility]:    Location of Assessment: [facility] Assessment Services  Provider Location: [facility] Assessment Services   Collateral Involvement: none   Does Patient Have a Automotive engineer Guardian? No  Legal Guardian Contact Information: n/a  Copy of Legal Guardianship Form: -- (n/a)  Legal Guardian Notified of Arrival: -- (n/a)  Legal Guardian Notified of Pending Discharge: -- (n/a)  If Minor and Not Living with Parent(s), Who has Custody? n/a  Is CPS involved or ever been involved? Never  Is APS involved or ever been involved? Never   Patient Determined To Be At Risk for Harm To Self or Others Based on Review of Patient Reported Information or Presenting Complaint? Yes, for Self-Harm  Method: Plan without intent  Availability of Means: No access or NA  Intent: Vague intent or NA  Notification Required: No need or identified person  Additional Information for Danger to Others Potential: n/a Additional Comments for Danger to Others Potential: n/a  Are There Guns or Other Weapons in Your Home? No  Types of Guns/Weapons: Pt denies access to guns/ weapons.  Are These Weapons Safely Secured?                            Yes  Who Could Verify You Are Able To Have These Secured: Pt denies access to guns/ weapons.  Do You Have any Outstanding  Charges, Pending Court Dates, Parole/Probation? Pt denies any pending legal charges.  Contacted To Inform of Risk of Harm To Self or Others: -- (n/a)    Does Patient Present under Involuntary Commitment? No    [place] of Residence: [place]   Patient Currently Receiving the Following Services: Not Receiving Services   Determination of Need: Urgent (48 hours)   Options For Referral: [facility]; Inpatient Hospitalization     CCA Biopsychosocial Patient Reported Schizophrenia/Schizoaffective Diagnosis in Past: No   Strengths: pt is willing to seek treatment   Mental Health Symptoms Depression:   Change in energy/activity; Hopelessness; Irritability; Worthlessness   Duration of Depressive symptoms:  Duration of Depressive Symptoms: Greater than two weeks   Mania:   None   Anxiety:    None   Psychosis:   None   Duration of Psychotic symptoms:    Trauma:  None   Obsessions:   None   Compulsions:   None   Inattention:   None   Hyperactivity/Impulsivity:   None   Oppositional/Defiant Behaviors:   None   Emotional Irregularity:   Mood lability   Other Mood/Personality Symptoms:   none    Mental Status Exam Appearance and self-care  Stature:   Average   Weight: Average   Clothing:   Casual   Grooming:   Normal   Cosmetic use:   None   Posture/gait:   Normal   Motor activity:   Not Remarkable   Sensorium  Attention:   Normal   Concentration:   Normal   Orientation:   Time; Situation; Place; Person   Recall/memory:   Normal   Affect and Mood  Affect:   Depressed; Flat   Mood:   Depressed; Hopeless   Relating  Eye contact:   Normal   Facial expression:   Depressed; Sad   Attitude toward examiner:   Cooperative   Thought and Language  Speech flow:  Clear and Coherent   Thought content:   Appropriate to Mood and Circumstances   Preoccupation:   None   Hallucinations:   None   Organization:    Coherent   [employer] of Knowledge:   Fair   Intelligence:   Average   Abstraction:   Normal   Judgement:   Poor   Reality Testing:   Adequate   Insight:   Fair   Decision Making:   Impulsive   Social Functioning  Social Maturity:   Impulsive   Social Judgement:   Normal   Stress  Stressors:   Family conflict; Work   Coping Ability:   Overwhelmed; Exhausted   Skill Deficits:   Decision making; Self-control   Supports:   Friends/Service system     Religion: Religion/Spirituality Are You A Religious Person?: No How Might This Affect Treatment?: n/a  Leisure/Recreation: Leisure / Recreation Do You Have Hobbies?: No  Exercise/Diet: Exercise/Diet Do You Exercise?: No Have You Gained or Lost A Significant Amount of Weight in the Past Six Months?: No Do You Follow a Special Diet?: No Do You Have Any Trouble Sleeping?: Yes Explanation of Sleeping Difficulties: Pt reports that he is sleeping approx. 3 hours per night.   CCA Employment/Education Employment/Work Situation: Employment / Work Situation Employment Situation: Employed Work Stressors: Production manager has Been Impacted by Current Illness: No Has Patient ever Been in Equities trader?: No  Education: Education Is Patient Currently Attending School?: No Last Grade Completed: 12 Did You Product manager?: No Did You Have An Individualized Education Program (IIEP): No Did You Have Any Difficulty At [employer]?: No Patient's Education Has Been Impacted by Current Illness: No   CCA Family/Childhood History Family and Relationship History: Family history Marital status: Married Number of Years Married:  (UTA) What types of issues is patient dealing with in the relationship?: Pt reports that he was angry with wife earlier during a disagreement. Additional relationship information: n/a Does patient have children?: Yes How many children?: 2 How is patient's relationship with  their children?: Pt lives with children.  Childhood History:  Childhood History By whom was/is the patient raised?: Mother Did patient suffer any verbal/emotional/physical/sexual abuse as a child?: No Did patient suffer from severe childhood neglect?: No Has patient ever been sexually abused/assaulted/raped as an adolescent or adult?: No Was the patient ever a victim of a crime or a disaster?: No Witnessed domestic violence?: No Has patient  been affected by domestic violence as an adult?: No       CCA Substance Use Alcohol/Drug Use: Alcohol / Drug Use Pain Medications: See MAR Prescriptions: See MAR Over the Counter: See MAR History of alcohol / drug use?: Yes Longest period of sobriety (when/how long): UTA Negative Consequences of Use:  (n/a) Withdrawal Symptoms: None Substance #1 Name of Substance 1: Marijuana 1 - Age of First Use: UTA 1 - Amount (size/oz): 3 grames 1 - Frequency: daily 1 - Duration: UTA 1 - Last Use / Amount: Yesterday 05/14/2023 1 - Method of Aquiring: UTA 1- Route of Use: UTA                       ASAM's:  Six Dimensions of Multidimensional Assessment  Dimension 1:  Acute Intoxication and/or Withdrawal Potential:      Dimension 2:  Biomedical Conditions and Complications:      Dimension 3:  Emotional, Behavioral, or Cognitive Conditions and Complications:     Dimension 4:  Readiness to Change:     Dimension 5:  Relapse, Continued use, or Continued Problem Potential:     Dimension 6:  Recovery/Living Environment:     ASAM Severity Score:    ASAM Recommended Level of Treatment: ASAM Recommended Level of Treatment:  (n/a)   Substance use Disorder (SUD) Substance Use Disorder (SUD)  Checklist Symptoms of Substance Use:  (n/a)  Recommendations for Services/Supports/Treatments: Recommendations for Services/Supports/Treatments Recommendations For Services/Supports/Treatments: Individual Therapy, Inpatient Hospitalization, Medication  Management  Discharge Disposition:    DSM5 Diagnoses: Patient Active Problem List   Diagnosis Date Noted   Facial cellulitis    Tooth infection 07/21/2022   Excessive use of nonsteroidal anti-inflammatory drugs (NSAIDs) 07/21/2022   Cannabis abuse    Severe episode of recurrent major depressive disorder, 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   Panic attacks 09/15/2016   Bradycardia    OD (overdose of drug), intentional self-harm, initial encounter (HCC) 09/10/2016   Bradycardia, drug induced 09/10/2016   Hypotension due to drugs 09/10/2016   Overdose 09/10/2016   Generalized abdominal pain    Hyperkalemia    Uncontrollable vomiting    Seizure disorder (HCC) 05/26/2016   Cannabinoid hyperemesis syndrome 05/26/2016   Hypokalemia due to loss of potassium 05/26/2016   Tobacco dependence 05/26/2016   Nausea and vomiting 05/26/2016   Leukocytosis 12/27/2015   Abnormal finding on MRI of brain    Pachymeningitis      Referrals to Alternative Service(s): Referred to Alternative Service(s):   Place:   Date:   Time:    Referred to Alternative Service(s):   Place:   Date:   Time:    Referred to Alternative Service(s):   Place:   Date:   Time:    Referred to Alternative Service(s):   Place:   Date:   Time:     Brenton Grills

## 2023-05-16 ENCOUNTER — Inpatient Hospital Stay (HOSPITAL_COMMUNITY)
Admission: AD | Admit: 2023-05-16 | Discharge: 2023-05-19 | DRG: 883 | Disposition: A | Discharge status: Home / Self Care | Payer: 59 | Source: Intra-hospital | Attending: Psychiatry | Admitting: Psychiatry

## 2023-05-16 ENCOUNTER — Encounter (HOSPITAL_COMMUNITY): Payer: Self-pay | Admitting: Psychiatry

## 2023-05-16 DIAGNOSIS — F1210 Cannabis abuse, uncomplicated: Secondary | ICD-10-CM | POA: Diagnosis present

## 2023-05-16 DIAGNOSIS — F411 Generalized anxiety disorder: Secondary | ICD-10-CM | POA: Diagnosis present

## 2023-05-16 DIAGNOSIS — F329 Major depressive disorder, single episode, unspecified: Secondary | ICD-10-CM | POA: Diagnosis present

## 2023-05-16 DIAGNOSIS — Z818 Family history of other mental and behavioral disorders: Secondary | ICD-10-CM

## 2023-05-16 DIAGNOSIS — F410 Panic disorder [episodic paroxysmal anxiety] without agoraphobia: Secondary | ICD-10-CM | POA: Diagnosis present

## 2023-05-16 DIAGNOSIS — F6381 Intermittent explosive disorder: Secondary | ICD-10-CM | POA: Diagnosis present

## 2023-05-16 DIAGNOSIS — R45851 Suicidal ideations: Secondary | ICD-10-CM

## 2023-05-16 DIAGNOSIS — G40909 Epilepsy, unspecified, not intractable, without status epilepticus: Secondary | ICD-10-CM

## 2023-05-16 DIAGNOSIS — F17200 Nicotine dependence, unspecified, uncomplicated: Secondary | ICD-10-CM | POA: Diagnosis present

## 2023-05-16 DIAGNOSIS — G4700 Insomnia, unspecified: Secondary | ICD-10-CM | POA: Diagnosis present

## 2023-05-16 DIAGNOSIS — F4010 Social phobia, unspecified: Secondary | ICD-10-CM | POA: Diagnosis present

## 2023-05-16 DIAGNOSIS — F17213 Nicotine dependence, cigarettes, with withdrawal: Secondary | ICD-10-CM | POA: Diagnosis present

## 2023-05-16 DIAGNOSIS — Z79899 Other long term (current) drug therapy: Secondary | ICD-10-CM

## 2023-05-16 DIAGNOSIS — G40509 Epileptic seizures related to external causes, not intractable, without status epilepticus: Secondary | ICD-10-CM | POA: Diagnosis present

## 2023-05-16 DIAGNOSIS — Z9151 Personal history of suicidal behavior: Secondary | ICD-10-CM | POA: Diagnosis not present

## 2023-05-16 DIAGNOSIS — S069XAA Unspecified intracranial injury with loss of consciousness status unknown, initial encounter: Secondary | ICD-10-CM | POA: Insufficient documentation

## 2023-05-16 DIAGNOSIS — F339 Major depressive disorder, recurrent, unspecified: Secondary | ICD-10-CM | POA: Diagnosis not present

## 2023-05-16 DIAGNOSIS — F332 Major depressive disorder, recurrent severe without psychotic features: Secondary | ICD-10-CM | POA: Diagnosis not present

## 2023-05-16 LAB — GC/CHLAMYDIA PROBE AMP (~~LOC~~) NOT AT ARMC
Chlamydia: NEGATIVE
Comment: NEGATIVE
Comment: NORMAL
Neisseria Gonorrhea: NEGATIVE

## 2023-05-16 LAB — HEPATITIS PANEL, ACUTE
HCV Ab: NONREACTIVE
Hep A IgM: NONREACTIVE
Hep B C IgM: NONREACTIVE
Hepatitis B Surface Ag: NONREACTIVE

## 2023-05-16 LAB — HIV ANTIBODY (ROUTINE TESTING W REFLEX): HIV Screen 4th Generation wRfx: NONREACTIVE

## 2023-05-16 MED ORDER — GABAPENTIN 100 MG PO CAPS
100.0000 mg | ORAL_CAPSULE | Freq: Three times a day (TID) | ORAL | Status: DC
  Administered 2023-05-16 – 2023-05-17 (×3): 100 mg via ORAL
  Filled 2023-05-16 (×9): qty 1

## 2023-05-16 MED ORDER — TRAZODONE HCL 150 MG PO TABS
150.0000 mg | ORAL_TABLET | Freq: Every day | ORAL | Status: DC
  Administered 2023-05-16 – 2023-05-18 (×3): 150 mg via ORAL
  Filled 2023-05-16 (×5): qty 1

## 2023-05-16 MED ORDER — DIPHENHYDRAMINE HCL 50 MG/ML IJ SOLN: 50.0000 mg | Freq: Three times a day (TID) | INTRAMUSCULAR | Status: DC | PRN

## 2023-05-16 MED ORDER — TRAZODONE HCL 50 MG PO TABS: 50.0000 mg | ORAL_TABLET | Freq: Every evening | ORAL | Status: DC | PRN

## 2023-05-16 MED ORDER — NICOTINE 21 MG/24HR TD PT24
21.0000 mg | MEDICATED_PATCH | Freq: Every day | TRANSDERMAL | Status: DC
  Administered 2023-05-17 – 2023-05-19 (×3): 21 mg via TRANSDERMAL
  Filled 2023-05-16 (×4): qty 1

## 2023-05-16 MED ORDER — MAGNESIUM HYDROXIDE 400 MG/5ML PO SUSP: 30.0000 mL | Freq: Every day | ORAL | Status: DC | PRN

## 2023-05-16 MED ORDER — LORAZEPAM 2 MG/ML IJ SOLN: 2.0000 mg | Freq: Three times a day (TID) | INTRAMUSCULAR | Status: DC | PRN

## 2023-05-16 MED ORDER — FLUOXETINE HCL 20 MG PO CAPS
20.0000 mg | ORAL_CAPSULE | Freq: Every day | ORAL | Status: DC
  Administered 2023-05-16 – 2023-05-19 (×4): 20 mg via ORAL
  Filled 2023-05-16 (×6): qty 1

## 2023-05-16 MED ORDER — DIPHENHYDRAMINE HCL 25 MG PO CAPS
50.0000 mg | ORAL_CAPSULE | Freq: Three times a day (TID) | ORAL | Status: DC | PRN
  Administered 2023-05-18: 50 mg via ORAL
  Filled 2023-05-16: qty 2

## 2023-05-16 MED ORDER — HYDROXYZINE HCL 25 MG PO TABS
25.0000 mg | ORAL_TABLET | Freq: Three times a day (TID) | ORAL | Status: DC | PRN
  Administered 2023-05-16 – 2023-05-18 (×2): 25 mg via ORAL
  Filled 2023-05-16 (×2): qty 1

## 2023-05-16 MED ORDER — DIPHENHYDRAMINE HCL 25 MG PO CAPS: 50.0000 mg | ORAL_CAPSULE | Freq: Three times a day (TID) | ORAL | Status: DC | PRN

## 2023-05-16 MED ORDER — LORAZEPAM 1 MG PO TABS: 2.0000 mg | ORAL_TABLET | Freq: Three times a day (TID) | ORAL | Status: DC | PRN

## 2023-05-16 MED ORDER — ALUM & MAG HYDROXIDE-SIMETH 200-200-20 MG/5ML PO SUSP: 30.0000 mL | ORAL | Status: DC | PRN

## 2023-05-16 MED ORDER — HALOPERIDOL 5 MG PO TABS: 5.0000 mg | ORAL_TABLET | Freq: Three times a day (TID) | ORAL | Status: DC | PRN

## 2023-05-16 MED ORDER — NICOTINE POLACRILEX 2 MG MT GUM
2.0000 mg | CHEWING_GUM | OROMUCOSAL | Status: DC | PRN
  Administered 2023-05-16 – 2023-05-19 (×14): 2 mg via ORAL
  Filled 2023-05-16 (×2): qty 1

## 2023-05-16 MED ORDER — ACETAMINOPHEN 325 MG PO TABS: 650.0000 mg | ORAL_TABLET | Freq: Four times a day (QID) | ORAL | Status: DC | PRN

## 2023-05-16 MED ORDER — TRAZODONE HCL 50 MG PO TABS
50.0000 mg | ORAL_TABLET | Freq: Every day | ORAL | Status: DC
  Filled 2023-05-16: qty 1

## 2023-05-16 MED ORDER — HALOPERIDOL LACTATE 5 MG/ML IJ SOLN: 5.0000 mg | Freq: Three times a day (TID) | INTRAMUSCULAR | Status: DC | PRN

## 2023-05-16 NOTE — Discharge Instructions (Addendum)
Transfer to [facility] 
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
## 2023-05-16 NOTE — BHH Counselor (Signed)
Adult Comprehensive Assessment  Patient ID: Jaime Nixon, male   DOB: 12-31-1995, 27 y.o.   MRN: [id number]  Information Source: Information source: Patient  Current Stressors:  Patient states their primary concerns and needs for treatment are:: " needing a therapist and my anger episodes " Patient states their goals for this hospitilization and ongoing recovery are:: " getting connected with a therapist " Educational / Learning stressors: None reported Employment / Job issues: None reported Family Relationships: None reported Surveyor, quantity / Lack of resources (include bankruptcy): None reported Housing / Lack of housing: None reported Physical health (include injuries & life threatening diseases): None reported Social relationships: None reported Substance abuse: None reported Bereavement / Loss: None reported  Living/Environment/Situation:  Living Arrangements: Spouse/significant other, Children Living conditions (as described by patient or guardian): Lives in home Who else lives in the home?: wife and children How long has patient lived in current situation?: pt states that he has lived in the home since birth but with wife and children 2 years What is atmosphere in current home: Comfortable, Supportive  Family History:  Marital status: Married Number of Years Married: 2 What types of issues is patient dealing with in the relationship?: Arguements Additional relationship information: n/a Are you sexually active?: Yes What is your sexual orientation?: heterosexual Has your sexual activity been affected by drugs, alcohol, medication, or emotional stress?: None reported Does patient have children?: Yes How many children?: 2 How is patient's relationship with their children?: Pt lives with children  Childhood History:  By whom was/is the patient raised?: Grandparents Additional childhood history information: None reported Description of patient's relationship with caregiver when  they were a child: " good " Patient's description of current relationship with people who raised him/her: " grandparents deceased " How were you disciplined when you got in trouble as a child/adolescent?: " I wasn't " Does patient have siblings?: Yes Number of Siblings: 2 Description of patient's current relationship with siblings: 73 and 6 year old , states that they he has a good relationship with them Did patient suffer any verbal/emotional/physical/sexual abuse as a child?: No Did patient suffer from severe childhood neglect?: No Has patient ever been sexually abused/assaulted/raped as an adolescent or adult?: No Was the patient ever a victim of a crime or a disaster?: No Witnessed domestic violence?: No Has patient been affected by domestic violence as an adult?: No  Education:  Highest grade of school patient has completed: 9th grade Currently a student?: No Learning disability?: Yes What learning problems does patient have?: ADHD  Employment/Work Situation:   Employment Situation: Employed Where is Patient Currently Employed?: [employer] How Long has Patient Been Employed?: less than a year Are You Satisfied With Your Job?: Yes Do You Work More Than One Job?: No Work Stressors: Did not say Patient's Job has Been Impacted by Current Illness: No What is the Longest Time Patient has Held a Job?: 3 years Where was the Patient Employed at that Time?: [employer] Has Patient ever Been in the [employer]?: No  Financial Resources:   Financial resources: Income from employment, Medicaid Does patient have a representative payee or guardian?: No  Alcohol/Substance Abuse:   What has been your use of drugs/alcohol within the last 12 months?: Vapes, marijuana, and cigarettes If attempted suicide, did drugs/alcohol play a role in this?: Yes (States that back then it caused him to be suicidal) Alcohol/Substance Abuse Treatment Hx: Denies past history Has alcohol/substance  abuse ever caused legal problems?: No  Social Support System:   Patient's Community Support System: Good Describe Community Support System: " my wife " Type of faith/religion: Ephriam Knuckles How does patient's faith help to cope with current illness?: " not really "  Leisure/Recreation:   Do You Have Hobbies?: Yes Leisure and Hobbies: " fishing and nature trail "  Strengths/Needs:   What is the patient's perception of their strengths?: " constructions and painting " Patient states they can use these personal strengths during their treatment to contribute to their recovery: " work in a peaceful environment " Patient states these barriers may affect/interfere with their treatment: None shared Patient states these barriers may affect their return to the community: None shared Other important information patient would like considered in planning for their treatment: NA  Discharge Plan:   Currently receiving community mental health services: No Patient states concerns and preferences for aftercare planning are: Pt states that he needs a therapist Patient states they will know when they are safe and ready for discharge when: " once they get me on a medication and I have counseling services " Does patient have access to transportation?: Yes Does patient have financial barriers related to discharge medications?: No Will patient be returning to same living situation after discharge?: Yes  Summary/Recommendations:   Summary and Recommendations (to be completed by the evaluator): Jaime Nixon is a 27 y/o male who was admitted to [facility] due to allowing his anger to get the best of him . Jaime Nixon shared that he knows that he needs counseling which is why he came to get help . Jaime Nixon was very pleasant during assessment, able to answer CSW questions. Patient stated " what doesn't stress anyone out " when asking him about his stressors. Patient states that this is about his second time being hospitalized .  Patient has a history of overdose, cannabis use, and panic attacks. Currently has a diagnosis of MDD and SI . Patient is not connected to any outside providers. Patient will return back to his home with wife and children once released.While here, Jaime Nixon can benefit from crisis stabilization, medication management, therapeutic milieu, and referrals for services.   Jaime Nixon. 05/16/2023

## 2023-05-16 NOTE — ED Provider Notes (Addendum)
FBC/OBS ASAP Discharge Summary  Date and Time: 05/16/2023 8:14 AM  Name: Jaime Nixon  MRN:  [id number]   Discharge Diagnoses:  Final diagnoses:  Episode of recurrent major depressive disorder, unspecified depression episode severity (HCC)  Suicidal ideation    Subjective: Patient seen and evaluated face to face by this provider and chat reviewed. On evaluation, patient is alert and oriented x 4. His thought process is logical and speech is clear and coherent. His mood is depressed and affect is congruent. He has fair eye contact. He is calm and cooperative and does not appear to be in acute distress. Today, he denies active SI. He denies HI. He denies AVH. There is no objective evidence that the patient is currently responding to internal or external stimuli. He describes his mood today as feeling "numb." He is unable to rate his mood. He reports a fair appetite. He reports improved sleep last night. He denies medication side effects. He denies physical complaints. He was advised that he has been accepted to [facility] for inpatient treatment and will transfer to this morning. Patient consent to inpatient treatment at [facility].   Stay Summary: Per my initial assessment Jaime Nixon is a 27 year old male patient with a past psychiatric history significant for MDD, substance abuse, and panic attacks who presented to the [facility] behavioral health urgent care voluntary accompanied by law enforcement after his wife called 911 due to patient busting out his car windows because he was angry.   Patient seen and evaluated face-to-face by this provider, and chart reviewed. On evaluation, patient is alert and oriented x 4. His thought process is linear and speech is clear and coherent. His mood is depressed and affect is congruent. He has fair eye contact.  He appears casually dressed. He is calm and cooperative and does not appear to be in acute distress. Patient states that his wife called the  police because he flipped out, lost his cool and busted out his car windows. He states that he had an altercation with an old friend (male) yesterday that carried over to today. He states that he woke up in a mood  today and progressively got worse. He states that the police came to talk to him and he told them that he needed mental health treatment. He states that he has been feeling pretty suicidal and having a lot of emotional problems for the past 6 to 7 years. He states that for the past 3 years the suicidal thoughts have been growing. He currently endorses suicidal ideations with a plan to obtain a gun and shoot himself and states, "that is the quickest way." He denies access to firearms. He reports 3 past suicide attempts. He denies self-injurious behaviors. He denies homicidal ideations. He describes his current mood as feeling "numb." He endorses depressive symptoms of feeling down, angry, irritable, unhappy, worthless, and hopeless. He reports poor sleep. On average, he reports sleeping 3 to 4 hours per night. .He reports having occasional nightmares due to not finding out his father was dead for 2 weeks, and his grandmother passed away while he was incarcerated. He reports a fair appetite. He reports vaping nicotine daily. He denies drinking alcohol. He reports daily marijuana use, on average he smokes 3 g/day. He reports using Xanax 1 mg once yesterday. He states yesterday was the first time he has used Xanax in a long time. He reports using Xanax maybe 5 times in the past year. He reports a past  substance abuse history of abusing heroin, methamphetamines, coke, acid, and Xanax. He reports a medical history of seizures and states that he not taking any medications for seizures in the last 7 years for seizures. He reports that his last seizure was 3 years ago. He states that he occasionally will take Xanax to help him calm down so that he does not go into a seizure. He denies physical complaints at this  time. He denies outpatient psychiatry or therapy at this time. He denies taking prescribed medications.    Total Time spent with patient: 30 minutes  Past Psychiatric History: History of MDD, substance abuse, and panic attacks. Patient reports three past suicide attempts by to overdoses twice and attempting to hang himself once. Patient hospitalized at [facility] from 09/18/2016 to 09/20/2016   Past Medical History: History of seizures. Last reported seizure three years ago. Patient denies taking anti seizures medications for the last 7 years.     Family History: History of (p) great-grandfather committed suicide by shooting himself, (p) grandfather committed suicide by drinking himself to death, dad has a history of depression and (p) grandmother has a history of depression.   Social History: Patient resides with his wife and 2 biological sons ages 107 and 7 years old. Patient denies access to firearms in the home. Patient denies current legal issues but reports past legal issues, incarcerated six years ago for assault.    Tobacco Cessation:  Prescription not provided because: transferring to [facility]. Will place order for [facility].   Current Medications:  Current Facility-Administered Medications  Medication Dose Route Frequency Provider Last Rate Last Admin   acetaminophen (TYLENOL) tablet 650 mg  650 mg Oral Q6H PRN Arlita Buffkin L, NP       alum & mag hydroxide-simeth (MAALOX/MYLANTA) 200-200-20 MG/5ML suspension 30 mL  30 mL Oral Q4H PRN Brailyn Delman L, NP       hydrOXYzine (ATARAX) tablet 25 mg  25 mg Oral TID PRN Aneira Cavitt L, NP   25 mg at 05/15/23 2149   loperamide (IMODIUM) capsule 2-4 mg  2-4 mg Oral PRN Jalan Fariss L, NP       LORazepam (ATIVAN) tablet 1 mg  1 mg Oral Q6H PRN Evalyn Shultis L, NP       LORazepam (ATIVAN) tablet 1 mg  1 mg Oral QID Darielle Hancher L, NP   1 mg at 05/15/23 2149   Followed by   Melene Muller ON 05/17/2023] LORazepam (ATIVAN) tablet 1  mg  1 mg Oral TID Marcin Holte L, NP       Followed by   Melene Muller ON 05/18/2023] LORazepam (ATIVAN) tablet 1 mg  1 mg Oral BID Tehran Rabenold L, NP       Followed by   Melene Muller ON 05/19/2023] LORazepam (ATIVAN) tablet 1 mg  1 mg Oral Daily Dameir Gentzler L, NP       magnesium hydroxide (MILK OF MAGNESIA) suspension 30 mL  30 mL Oral Daily PRN Demaree Liberto L, NP       multivitamin with minerals tablet 1 tablet  1 tablet Oral Daily Onesimo Lingard L, NP   1 tablet at 05/15/23 1752   nicotine (NICODERM CQ - dosed in mg/24 hours) patch 21 mg  21 mg Transdermal Daily Jemari Hallum L, NP   21 mg at 05/15/23 1816   ondansetron (ZOFRAN-ODT) disintegrating tablet 4 mg  4 mg Oral Q6H PRN Suha Schoenbeck L, NP       thiamine (  VITAMIN B1) tablet 100 mg  100 mg Oral Daily Shareef Eddinger L, NP       traZODone (DESYREL) tablet 50 mg  50 mg Oral QHS PRN Alen Matheson L, NP   50 mg at 05/15/23 2149   No current outpatient medications on file.    PTA Medications:  Facility Ordered Medications  Medication   acetaminophen (TYLENOL) tablet 650 mg   alum & mag hydroxide-simeth (MAALOX/MYLANTA) 200-200-20 MG/5ML suspension 30 mL   magnesium hydroxide (MILK OF MAGNESIA) suspension 30 mL   hydrOXYzine (ATARAX) tablet 25 mg   traZODone (DESYREL) tablet 50 mg   [COMPLETED] thiamine (VITAMIN B1) injection 100 mg   thiamine (VITAMIN B1) tablet 100 mg   multivitamin with minerals tablet 1 tablet   LORazepam (ATIVAN) tablet 1 mg   loperamide (IMODIUM) capsule 2-4 mg   ondansetron (ZOFRAN-ODT) disintegrating tablet 4 mg   LORazepam (ATIVAN) tablet 1 mg   Followed by   Melene Muller ON 05/17/2023] LORazepam (ATIVAN) tablet 1 mg   Followed by   Melene Muller ON 05/18/2023] LORazepam (ATIVAN) tablet 1 mg   Followed by   Melene Muller ON 05/19/2023] LORazepam (ATIVAN) tablet 1 mg   nicotine (NICODERM CQ - dosed in mg/24 hours) patch 21 mg       08/06/2022    4:16 PM 01/01/2016    4:43 PM  Depression screen PHQ 2/9  Decreased Interest 0  0  Down, Depressed, Hopeless 0 0  PHQ - 2 Score 0 0    Flowsheet Row ED from 05/15/2023 in [facility] ED from 03/27/2023 in [facility] ED from 03/20/2023 in [facility] RISK CATEGORY Moderate Risk No Risk No Risk       Musculoskeletal  Strength & Muscle Tone: within normal limits Gait & Station: normal Patient leans: N/A  Psychiatric Specialty Exam  Presentation  General Appearance:  Appropriate for Environment  Eye Contact: Fair  Speech: Clear and Coherent  Speech Volume: Normal  Handedness: Right   Mood and Affect  Mood: Depressed  Affect: Congruent   Thought Process  Thought Processes: Coherent  Descriptions of Associations:Intact  Orientation:Full (Time, Place and Person)  Thought Content:Logical  Diagnosis of Schizophrenia or Schizoaffective disorder in past: No    Hallucinations:Hallucinations: None  Ideas of Reference:None  Suicidal Thoughts:No  Homicidal Thoughts:Homicidal Thoughts: No   Sensorium  Memory: Immediate Fair; Recent Fair; Remote Fair  Judgment: Intact  Insight: Present   Executive Functions  Concentration: Fair  Attention Span: Fair  Recall: [employer] of Knowledge: Fair  Language: Fair   Psychomotor Activity  Psychomotor Activity: Psychomotor Activity: Normal   Assets  Assets: Communication Skills; Desire for Improvement; Housing; Leisure Time; Physical Health; Intimacy; Social Support   Sleep  Sleep: Improved.    Nutritional Assessment (For OBS and FBC admissions only) Has the patient had a weight loss or gain of 10 pounds or more in the last 3 months?: No Has the patient had a decrease in food intake/or appetite?: No Does the patient have dental problems?: No Does the patient have eating habits or behaviors that may be indicators of an eating disorder including binging or inducing vomiting?: No Has  the patient recently lost weight without trying?: 0 Has the patient been eating poorly because of a decreased appetite?: 0 Malnutrition Screening Tool Score: 0    Physical Exam  Physical Exam HENT:     Head: Normocephalic.     Nose:  Nose normal.  Eyes:     Conjunctiva/sclera: Conjunctivae normal.  Cardiovascular:     Rate and Rhythm: Normal rate.  Pulmonary:     Effort: Pulmonary effort is normal.  Musculoskeletal:        General: Normal range of motion.     Cervical back: Normal range of motion.  Neurological:     Mental Status: He is alert and oriented to person, place, and time.    Review of Systems  Constitutional: Negative.   HENT: Negative.    Eyes: Negative.   Respiratory: Negative.    Cardiovascular: Negative.   Gastrointestinal: Negative.   Genitourinary: Negative.   Musculoskeletal: Negative.   Neurological: Negative.   Endo/Heme/Allergies: Negative.    Blood pressure 110/63, pulse 77, temperature 97.7 F (36.5 C), resp. rate 16, SpO2 99%. There is no height or weight on file to calculate BMI.   Plan Of Care/Follow-up recommendations:  Activity:  as tolerated  Disposition: Patient accepted to [facility] today. EMTALA completed.   Cylis Ayars L, NP 05/16/2023, 8:14 AM

## 2023-05-16 NOTE — Progress Notes (Signed)
Pt admitted to [facility] at this time. Pt reports needing help with anger management and suicidal thoughts. Pt is married and lives at home with his wife and children. Pt reports he is the sole provider for his family and it is stressful. Pt states he has attempted suicide in the past almost 10 yrs ago. Pt reports suicidal thoughts since that time that are increasing in frequency and severity. Pt reports current thoughts include shooting self in the heart with a gun. Pt denies A/V/H and HI. Pt states he became escalated yesterday and busted the windows out of his car. Pt states his wife was concerned and called the police. Pt states he smokes marijuana daily, denies other substance use and alcohol use. Q 15 minute checks initiated.

## 2023-05-16 NOTE — ED Notes (Signed)
Rn had to take another multivitamin out of pixis due to dropping the 1 one on e floor.

## 2023-05-16 NOTE — Group Note (Signed)
Recreation Therapy Group Note   Group Topic:Communication  Group Date: 05/16/2023 Start Time: 1000 End Time: 1025 Facilitators: Cecille Mcclusky-McCall, LRT,CTRS Location: [address] Dayroom   Goal Area(s) Addresses:  Patient will effectively listen to complete activity.  Patient will identify communication skills used to make activity successful.  Patient will identify how skills used during activity can be used to reach post d/c goals.    Group Description: Geometric Drawings.  Three volunteers from the peer group will be shown an abstract picture with a particular arrangement of geometrical shapes.  Each round, one 'speaker' will describe the pattern, as accurately as possible without revealing the image to the group.  The remaining group members will listen and draw the picture to reflect how it is described to them. Patients with the role of 'listener' cannot ask clarifying questions but, may request that the speaker repeat a direction. Once the drawings are complete, the presenter will show the rest of the group the picture and compare how close each person came to drawing the picture. LRT will facilitate a post-activity discussion regarding effective communication and the importance of planning, listening, and asking for clarification in daily interactions with others.   Affect/Mood: N/A   Participation Level: Did not attend    Clinical Observations/Individualized Feedback:     Plan: Continue to engage patient in RT group sessions 2-3x/week.   Jaime Nixon, LRT,CTRS 05/16/2023 12:25 PM

## 2023-05-16 NOTE — ED Notes (Signed)
Patient discharged via Safe Transport to [facility]. All belongings returned to patient. NAD noted.

## 2023-05-16 NOTE — Progress Notes (Signed)
Pt was accepted to [facility] TODAY  05/16/2023; Bed Assignment 400-1 PENDING signed vol consent faxed to [facility] [phone]  Pt meets inpatient criteria per Liborio Nixon, NP  Attending Physician will be Dr. Phineas Inches, MD  Report can be called to: -Adult unit: [phone]  Pt can arrive after: [facility] will coordinate with care team.  Care Team notified: Liborio Nixon, NP, [facility] AC Fransico Michael, RN, [facility] [place], RN, [facility] Rosey Bath, RN, Lavonna Monarch, Laurena Spies, RN, Chinwendu Royston Bake, NP   Kelton Pillar, LCSWA 05/16/2023 @ 12:46 AM

## 2023-05-17 DIAGNOSIS — F6381 Intermittent explosive disorder: Principal | ICD-10-CM

## 2023-05-17 LAB — RPR: RPR Ser Ql: NONREACTIVE — AB

## 2023-05-17 MED ORDER — GABAPENTIN 100 MG PO CAPS
200.0000 mg | ORAL_CAPSULE | Freq: Three times a day (TID) | ORAL | Status: DC
  Administered 2023-05-17 – 2023-05-19 (×5): 200 mg via ORAL
  Filled 2023-05-17 (×10): qty 2

## 2023-05-17 NOTE — Group Note (Signed)
LCSW Group Therapy Note  05/17/2023    10:00-11:00am   Type of Therapy and Topic:  Early Messages Received About Anger  Participation Level:  Active   Description of Group:   In this group, patients shared and discussed the early messages received in their lives about anger through parental or other adult modeling, teaching, repression, punishment, violence, and more.  Participants identified how those lessons influence how they often react when angered.  The group discussed that anger is a secondary emotion caused by other feelings such as fear, judgment, shame, or embarrassment.  Some of the [employer] were discussed and it was recommended that they share the handout with their family or other important adults and discuss during a calm time how using these could help them communicate better and have improved outcomes.  The rules shared included "I" statements, taking a break, one person speaking at a time, not cursing or calling names, and only one topic being discussed at a time.  Therapeutic Goals: Patients will identify one or more childhood message about anger that they received and how it was taught to them. Patients will discuss how these childhood experiences have influenced and continue to influence their own expression or repression of anger even today. Patients will explore possible primary emotions that tend to fuel their secondary emotion of anger. Patients will learn that anger itself is normal and cannot be eliminated, and that healthier coping skills can assist with resolving conflict rather than worsening situations.  Summary of Patient Progress:  The patient shared that his childhood lessons about anger were that there was always anger around him.  As a result, anger is "about the only emotion I know."  He was a great participant in group and showed a lot of willingness to grow.  He asked a lot of questions, was quite attentive to what everyone had to say throughout  group, including staff.   The patient was open to the [employer] explained in group.  The patient participated fully and demonstrated insight.  Therapeutic Modalities:   Cognitive Behavioral Therapy Motivation Interviewing  [name], [place] 05/17/2023 4:17 PM

## 2023-05-17 NOTE — Progress Notes (Signed)
D:  Patient's self inventory sheet, patient sleeps good, sleep medication helpful.  Good appetite, normal energy level, good concentration.  Rated depression and anxiety 4, hopeless 5.  Denied withdrawals.  Denied SI.  Denied physical problems.  Physical pain, back, worst pain #4  Would like to have therapy after discharge from [facility].  Staff has been awesome.  Does have discharge plans. A:  Medications administered per MD orders.  Emotional support and encouragement given patient. R:  Denied SiIand Hi, contracts for safety.  Denied A/V hallucination.   Safety maintained with 15 minute checks.

## 2023-05-17 NOTE — [facility] Suicide Risk Assessment (Signed)
Suicide Risk Assessment  Admission Assessment    [facility] Admission Suicide Risk Assessment   Nursing information obtained from:  Patient Demographic factors:  Male Current Mental Status:  Suicidal ideation indicated by patient Loss Factors:  Financial problems / change in socioeconomic status Historical Factors:  Prior suicide attempts Risk Reduction Factors:  Employed, Responsible for children under 29 years of age, Living with another person, especially a relative, Sense of responsibility to family  Total Time spent with patient: 15 minutes Principal Problem: Intermittent explosive disorder in adult Diagnosis:  Principal Problem:   Intermittent explosive disorder in adult Active Problems:   Seizure disorder (HCC)   Tobacco dependence   Cannabis abuse   Suicidal ideations   MDD (major depressive disorder)   GAD (generalized anxiety disorder)  Subjective Data:  On the day the patient was brought to the ER, he reported that his him and his wife got into a fight.  It started when there was a frog in the house and he asked her to get the frog out.  A bit later, he was then in the yard doing yard work when his wife came out and said the frog was in the bedroom.  He asked her why she did not get a frog and she said that she did not hear him and then this led to an argument.  After short verbal argument, he "lost his shit" and got very angry.  His wife got the kids and got in the car to drive away, and he punched a hole in the window.   His wife called 911 and police were dispatched with a counselor who convinced him to go to [facility] and get help.  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   In general the patient reports a feeling consistent anger over the last many years.  He says that he cannot feel emotions and that he will act happy/sad/etc. but on the inside he feels  nothing, except anger.  For example, his pet bunny (Hoppy) died recently and he felt no emotions over this, even though he was very connected to the bunny.  Furthermore he notes that he will "explode" about once a year, similar to this current episode.   Regarding depressive symptoms over the past two weeks, the patient reports depressed mood, loss of interest, decreased sleep, decreased concentration, and suicidal thoughts (without intention but with plan to shot himself). He denies any period of time in his life of elevated/irritable mood alongside decreased need for sleep and increase in activities. He endorses having anxiety frequently and that the negative effects of life.  He also endorses having anxiety in social situations and occasional panic attacks in which he feels himself becoming very anxious and starts shaking and having palpitations can occasionally lead to him getting angry (not to the extent of yesterday, he reports this level of anger is only annual).  He denies obsessions or compulsions.  He denies hallucinations and does not report that people are a plot against him, spying on him, are trying to harm him in any way.   Collateral (wife, Joyice Faster, [phone]): Reports that current stressors include financial hardship, wife is unable to care, prices of daycare and food. She reports there is times where he is frustrated, and he smokes weed to calm his nerves. She does not describe him as an angry person, and he is  normally very patient. She believes that the patient has severe insomnia. She also reports that he wakes up sweating and panicking.  She believes that he has ADHD. She also endorses that he has issues with depression and anxiety and that these issues are worse than his anger.  He had a brain injury at age 71 and never got the treatment he needed. 2016 had his first seizure.  Has not been on medication due to insurance not covering Keppra and the outrageous price.   Continued  Clinical Symptoms:  Alcohol Use Disorder Identification Test Final Score (AUDIT): 0 The "Alcohol Use Disorders Identification Test", Guidelines for Use in Primary Care, Second Edition.  World Science writer [facility]). Score between 0-7:  no or low risk or alcohol related problems. Score between 8-15:  moderate risk of alcohol related problems. Score between 16-19:  high risk of alcohol related problems. Score 20 or above:  warrants further diagnostic evaluation for alcohol dependence and treatment.   CLINICAL FACTORS:   Panic Attacks Depression:   Anhedonia Hopelessness Insomnia Alcohol/Substance Abuse/Dependencies Epilepsy   Musculoskeletal: Strength & Muscle Tone: within normal limits Gait & Station: normal Patient leans: N/A  Psychiatric Specialty Exam:  Presentation  General Appearance:  Appropriate for Environment  Eye Contact: Good  Speech: Normal Rate  Speech Volume: Normal  Handedness: Right   Mood and Affect  Mood: Euthymic  Affect: Congruent   Thought Process  Thought Processes: Coherent  Descriptions of Associations:Intact  Orientation:Full (Time, Place and Person)  Thought Content:Logical  History of Schizophrenia/Schizoaffective disorder:No  Duration of Psychotic Symptoms:No data recorded Hallucinations:Hallucinations: None  Ideas of Reference:None  Suicidal Thoughts:Suicidal Thoughts: No  Homicidal Thoughts:Homicidal Thoughts: No   Sensorium  Memory: Immediate Good; Recent Good; Remote Good  Judgment: Fair  Insight: Fair   Chartered certified accountant: Fair  Attention Span: Fair  Recall: [employer] of Knowledge: Fair  Language: Fair   Psychomotor Activity  Psychomotor Activity: Psychomotor Activity: Increased   Assets  Assets: Communication Skills; Desire for Improvement; Housing; Health and safety inspector; Social Support; Transportation   Sleep  Sleep: Sleep: Poor Number of Hours  of Sleep: 3    Physical Exam: Physical Exam Vitals and nursing note reviewed.  HENT:     Head: Normocephalic and atraumatic.  Pulmonary:     Effort: Pulmonary effort is normal.  Neurological:     General: No focal deficit present.     Mental Status: He is alert.    Review of Systems  Constitutional:  Negative for fever.  Cardiovascular:  Negative for chest pain and palpitations.  Gastrointestinal:  Negative for constipation, diarrhea, nausea and vomiting.  Neurological:  Negative for dizziness, weakness and headaches.   Blood pressure 119/77, pulse 87, temperature 97.6 F (36.4 C), temperature source Oral, resp. rate 16, height 6' (1.829 m), weight 83.5 kg, SpO2 99%. Body mass index is 24.95 kg/m.   COGNITIVE FEATURES THAT CONTRIBUTE TO RISK:  Loss of executive function    SUICIDE RISK:   Moderate:  Frequent suicidal ideation with limited intensity, and duration, some specificity in terms of plans, no associated intent, good self-control, limited dysphoria/symptomatology, some risk factors present, and identifiable protective factors, including available and accessible social support.    PLAN OF CARE:    ASSESSMENT:   Diagnoses / Active Problems: Intermittent explosive disorder in adult GAD with social anxiety and panic attacks MDD Cannabis use Nicotine withdrawal     Jaime Nixon 27 year old male with past psychiatric history of MDD and hospitalization for  SA in 2017 who presents with persistent anger, frequent panic attack like episodes, and rare severe explosive rages.  Medical history significant for severe TBI at age 41 resulting in seizure disorder (currently not being treated).  Developmental history of father with anger outbursts.  Patient meets criteria for MDD and generalized anxiety disorder with social phobia and panic attacks.  Patient also has significant insomnia per patient report and per wife report.  Given the presence of GAD, MDD, and intermittent  explosive disorder we will start an SSRI, specifically fluoxetine which has the most evidence for IED.  We will also start trazodone for insomnia.  Given the patient's panic attacks, we will start hydroxyzine 25 mg as needed 3 times daily for anxiety.  Patient could be a candidate for an anticonvulsive with off-label use evidence for IED, specially since he needs to be on medication for seizure disorder anyway, but we will not start here as we do not know the type of seizures he experiences.     PLAN: Safety and Monitoring:             -- Involuntary admission to inpatient psychiatric unit for safety, stabilization and treatment             -- Daily contact with patient to assess and evaluate symptoms and progress in treatment             -- Patient's case to be discussed in multi-disciplinary team meeting             -- Observation Level : q15 minute checks             -- Vital signs:  q12 hours             -- Precautions: suicide, elopement, and assault   2. Psychiatric Diagnoses and Treatment:  -- Start fluoxetine 20 mg once daily for IED, GAD, MDD             -- Start trazodone 50 mg once at night for insomnia             -- Start trazodone 50 mg as needed once a night for insomnia             -- Start hydroxyzine 25 mg as needed 3 times daily for anxiety --  The risks/benefits/side-effects/alternatives to this medication were discussed in detail with the patient and time was given for questions. The patient consents to medication trial.              -- Metabolic profile and EKG monitoring obtained while on an atypical antipsychotic. See #4 below for values.              -- Encouraged patient to participate in unit milieu and in scheduled group therapies              -- Short Term Goals: Ability to identify changes in lifestyle to reduce recurrence of condition will improve, Ability to verbalize feelings will improve, Ability to disclose and discuss suicidal ideas, Ability to demonstrate  self-control will improve, Ability to identify and develop effective coping behaviors will improve, Ability to maintain clinical measurements within normal limits will improve, Compliance with prescribed medications will improve, and Ability to identify triggers associated with substance abuse/mental health issues will improve             -- Long Term Goals: Improvement in symptoms so as ready for discharge  3. Medical Issues Being Addressed:              -- Seizure disorder: Not starting medication at this time but encouraging the patient to get established with a primary care provider for a neurology referral.  Will ask social worker if they can get the patient a PCP--he has Medicaid.   4. Routine and other pertinent labs reviewed: EKG monitoring: QTc: 367   Metabolism / endocrine: BMI: Body mass index is 24.95 kg/m.   Lipid Panel: Recent Labs       Lab Results  Component Value Date    CHOL 232 (H) 05/15/2023    TRIG 121 05/15/2023    HDL 59 05/15/2023    CHOLHDL 3.9 05/15/2023    VLDL 24 05/15/2023    LDLCALC 149 (H) 05/15/2023    LDLCALC 118 (H) 09/19/2016      HbgA1c: Last Labs     Hgb A1c MFr Bld (%)  Date Value  05/15/2023 5.5      TSH: Last Labs     TSH (uIU/mL)  Date Value  05/15/2023 1.645        Labs to order: None for now.   5. Discharge Planning:              -- Social work and case management to assist with discharge planning and identification of hospital follow-up needs prior to discharge             -- Estimated LOS: 3 to 5 days.             -- Discharge Concerns: Need to establish a safety plan; Medication compliance and effectiveness             -- Discharge Goals: Return home with outpatient referrals for mental health follow-up including medication management/psychotherapy  I certify that inpatient services furnished can reasonably be expected to improve the patient's condition.   Meryl Dare, MD 05/17/2023, 8:35 AM

## 2023-05-17 NOTE — Plan of Care (Signed)
Nurse discussed anxiety, depression and coping skills with patient.  

## 2023-05-17 NOTE — Plan of Care (Signed)
  Problem: Education: Goal: Knowledge of [facility] Education information/materials will improve Outcome: Progressing Goal: Emotional status will improve Outcome: Progressing Goal: Mental status will improve Outcome: Progressing   Problem: Activity: Goal: Sleeping patterns will improve Outcome: Progressing   Problem: Coping: Goal: Ability to demonstrate self-control will improve Outcome: Progressing

## 2023-05-17 NOTE — Plan of Care (Signed)
Nurse discussed hopeless feelings with patient.

## 2023-05-17 NOTE — Progress Notes (Signed)
D) Pt received calm, visible, participating in milieu, and in no acute distress. Pt A & O x4. Pt denies SI, HI, A/ V H, depression, anxiety and pain at this time. A) Pt encouraged to drink fluids. Pt encouraged to come to staff with needs. Pt encouraged to attend and participate in groups. Pt encouraged to set reachable goals.  R) Pt remained safe on unit, in no acute distress, will continue to assess.    05/17/23 2000  Psych Admission Type (Psych Patients Only)  Admission Status Voluntary  Psychosocial Assessment  Patient Complaints Anxiety  Eye Contact Fair  Facial Expression Flat  Affect Sad  Speech Logical/coherent  Interaction Assertive  Motor Activity Other (Comment) (wnl)  Appearance/Hygiene Unremarkable  Behavior Characteristics Cooperative  Mood Depressed  Thought Process  Coherency WDL  Content WDL  Delusions None reported or observed  Perception WDL  Hallucination None reported or observed  Judgment Impaired  Confusion None  Danger to Self  Current suicidal ideation? Denies  Agreement Not to Harm Self Yes  Description of Agreement verbal  Danger to Others  Danger to Others None reported or observed

## 2023-05-17 NOTE — Progress Notes (Signed)
Adult Psychoeducational Group Note  Date:  05/17/2023 Time:  3:27 AM  Group Topic/Focus:  Wrap-Up Group:   The focus of this group is to help patients review their daily goal of treatment and discuss progress on daily workbooks.  Participation Level:  Active  Participation Quality:  Appropriate  Affect:  Appropriate  Cognitive:  Appropriate  Insight: Appropriate  Engagement in Group:  Engaged  Modes of Intervention:  Discussion  Additional Comments:  Pt stated he had a good day.  Pt stated his goal for the day was to try to find a therapist and connect with himself.  Pt met goal.  Lucilla Lame 05/17/2023, 3:27 AM
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
## 2023-05-17 NOTE — [facility] Group Notes (Signed)
[facility] Group Notes:  (Nursing/MHT/Case Management/Adjunct)  Date:  05/17/2023  Time:  11:08 PM  Type of Therapy:   Group Wrap Up  Participation Level:  Active  Participation Quality:  Appropriate  Affect:  Appropriate  Cognitive:  Alert and Appropriate  Insight:  Appropriate, Good, and Improving  Engagement in Group:  Developing/Improving, Engaged, Improving, and Supportive  Modes of Intervention:  Discussion, Socialization, and Support  Summary of Progress/Problems: Pt was very vocal in group. Pt shared " I rate my day a 8/10, my goal today was to open up more, and its been a pretty easy day. Everyone seems very welcoming here and easy to express any concerns". Pt along with 4 other peers expressed how this program is very helpful.  [place]anville Lewis 05/17/2023, 11:08 PM

## 2023-05-18 ENCOUNTER — Other Ambulatory Visit: Payer: Self-pay

## 2023-05-18 ENCOUNTER — Encounter (HOSPITAL_COMMUNITY): Payer: Self-pay | Admitting: Psychiatry

## 2023-05-18 LAB — CBC WITH DIFFERENTIAL/PLATELET
Abs Immature Granulocytes: 0.05 10*3/uL (ref 0.00–0.07)
Basophils Absolute: 0.1 10*3/uL (ref 0.0–0.1)
Basophils Relative: 1 %
Eosinophils Absolute: 0.3 10*3/uL (ref 0.0–0.5)
Eosinophils Relative: 3 %
HCT: 48.6 % (ref 39.0–52.0)
Hemoglobin: 16.1 g/dL (ref 13.0–17.0)
Immature Granulocytes: 1 %
Lymphocytes Relative: 28 %
Lymphs Abs: 2.9 10*3/uL (ref 0.7–4.0)
MCH: 30.4 pg (ref 26.0–34.0)
MCHC: 33.1 g/dL (ref 30.0–36.0)
MCV: 91.9 fL (ref 80.0–100.0)
Monocytes Absolute: 0.9 10*3/uL (ref 0.1–1.0)
Monocytes Relative: 9 %
Neutro Abs: 6.3 10*3/uL (ref 1.7–7.7)
Neutrophils Relative %: 58 %
Platelets: 246 10*3/uL (ref 150–400)
RBC: 5.29 MIL/uL (ref 4.22–5.81)
RDW: 13.9 % (ref 11.5–15.5)
WBC: 10.6 10*3/uL — ABNORMAL HIGH (ref 4.0–10.5)
nRBC: 0 % (ref 0.0–0.2)

## 2023-05-18 LAB — VITAMIN D 25 HYDROXY (VIT D DEFICIENCY, FRACTURES): Vit D, 25-Hydroxy: 23.83 ng/mL — ABNORMAL LOW (ref 30–100)

## 2023-05-18 MED ORDER — CALAMINE EX LOTN
1.0000 | TOPICAL_LOTION | Freq: Two times a day (BID) | CUTANEOUS | Status: DC | PRN
  Filled 2023-05-18: qty 177

## 2023-05-18 MED ORDER — DIPHENHYDRAMINE HCL 25 MG PO CAPS: 25.0000 mg | ORAL_CAPSULE | Freq: Once | ORAL | Status: DC | PRN

## 2023-05-18 NOTE — Progress Notes (Signed)
[facility] MD Progress Note  05/18/2023 3:02 PM Jaime Nixon  MRN:  [id number] Subjective:   Jaime Nixon is a 27 year old male with past history of MDD, ADHD, and a suicide attempt at age 68 who presents voluntarily from behind to the behavioral health [facility] for punching a hole in a window during an anger outburst.   Case was discussed in the multidisciplinary team. MAR was reviewed and patient is compliant with medications.  Nurse reports patient got poor sleep last night.  Psychiatric Team made the following recommendations yesterday: -Continue fluoxetine 20 mg once daily for IED, GAD, MDD -Increased trazodone to 150 mg nightly for insomnia -Started gabapentin 200 mg TID for GAD, neuropathic pain -Continue trazodone 50 mg PRN for insomnia -Continue hydroxyzine 25 mg as needed 3 times daily for anxiety   Reviewed today patient reports no concerns and that he has been trying to stay upbeat and attend all group sessions..  He does not notice any significant effects of medications and reports no side effects.  He said that the trazodone was helpful the first night and probably helpful yesterday but he forgot to take his nicotine patch negatively affected his sleep due to nightmares and this got poor sleep.  When talking about his nightmares, he reports that they were of a trauma experience.  This prompted another conversation about trauma and PTSD (at admission he denied traumatic events).  He apparently had been involved in track events while he was a heavy substance user.  He endorses symptoms of PTSD including flashbacks, nightmares, avoidance, hypervigilance.  He has not noticed gabapentin to be effective for his neuropathic pain.  He also did not notice if the hydroxyine was helpful.  He reports that his appetite is still great and he is getting seconds.  He reports being contact with his wife.  He denies SI, HI, AVH.  Collateral (spouse, gabby, updated phone number [phone]): informed her  about the medication. Believes that he will be okay to come home tomorrow.  She reports that he is not disclosing suicidal thoughts in the past day appears to be generally improved since admission.  She asked questions about scheduling his therapy and outpatient psychiatrist and PCP.  I informed her that all of this will be included in the discharge paperwork.  I informed her that social work will reach out to her tomorrow for discharge safety planning and timing.   Principal Problem: Intermittent explosive disorder in adult Diagnosis: Principal Problem:   Intermittent explosive disorder in adult Active Problems:   Seizure disorder (HCC)   Tobacco dependence   Cannabis abuse   Suicidal ideations   MDD (major depressive disorder)   GAD (generalized anxiety disorder)  Total Time spent with patient: 15 minutes  Past Psychiatric History: MDD, ADHD, 2017 suicide attempt via drug overdose  Past Medical History:  Past Medical History:  Diagnosis Date   Current smoker    History of substance use    Seizures (HCC)    History reviewed. No pertinent surgical history. Family History:  Family History  Problem Relation Age of Onset   Seizures Father    Seizures Brother    Family Psychiatric  History: Seizure disorder in father, substance disorders in first-degree relatives, MDD and anxiety in first-degree relatives. Social History:  Social History   Substance and Sexual Activity  Alcohol Use Not Currently   Comment: rare use, but binges occasionally     Social History   Substance and Sexual Activity  Drug Use Yes  Types: Marijuana   Comment: every other day    Social History   Socioeconomic History   Marital status: Single    Spouse name: Not on file   Number of children: Not on file   Years of education: Not on file   Highest education level: Not on file  Occupational History   Occupation: landscaper  Tobacco Use   Smoking status: Every Day    Current packs/day: 1.00     Average packs/day: 1 pack/day for 10.0 years (10.0 ttl pk-yrs)    Types: Cigarettes   Smokeless tobacco: Never  Substance and Sexual Activity   Alcohol use: Not Currently    Comment: rare use, but binges occasionally   Drug use: Yes    Types: Marijuana    Comment: every other day   Sexual activity: Not on file  Other Topics Concern   Not on file  Social History Narrative   Not on file   Social Determinants of Health   Financial Resource Strain: Not on file  Food Insecurity: No Food Insecurity (05/16/2023)   Hunger Vital Sign    Worried About Running Out of Food in the Last Year: Never true    Ran Out of Food in the Last Year: Never true  Transportation Needs: No Transportation Needs (05/16/2023)   PRAPARE - Administrator, Civil Service (Medical): No    Lack of Transportation (Non-Medical): No  Physical Activity: Not on file  Stress: Not on file  Social Connections: Not on file     Sleep: Fair  Appetite:  Good  Current Medications: Current Facility-Administered Medications  Medication Dose Route Frequency Provider Last Rate Last Admin   acetaminophen (TYLENOL) tablet 650 mg  650 mg Oral Q6H PRN Sindy Guadeloupe, NP       alum & mag hydroxide-simeth (MAALOX/MYLANTA) 200-200-20 MG/5ML suspension 30 mL  30 mL Oral Q4H PRN Sindy Guadeloupe, NP       haloperidol (HALDOL) tablet 5 mg  5 mg Oral TID PRN Phineas Inches, MD       And   LORazepam (ATIVAN) tablet 2 mg  2 mg Oral TID PRN Phineas Inches, MD       And   diphenhydrAMINE (BENADRYL) capsule 50 mg  50 mg Oral TID PRN Massengill, Harrold Donath, MD       haloperidol lactate (HALDOL) injection 5 mg  5 mg Intramuscular TID PRN Massengill, Harrold Donath, MD       And   LORazepam (ATIVAN) injection 2 mg  2 mg Intramuscular TID PRN Massengill, Harrold Donath, MD       And   diphenhydrAMINE (BENADRYL) injection 50 mg  50 mg Intramuscular TID PRN Massengill, Harrold Donath, MD       FLUoxetine (PROZAC) capsule 20 mg  20 mg Oral Daily Meryl Dare, MD   20 mg at 05/18/23 0751   gabapentin (NEURONTIN) capsule 200 mg  200 mg Oral TID Starleen Blue, NP   200 mg at 05/18/23 1142   hydrOXYzine (ATARAX) tablet 25 mg  25 mg Oral TID PRN Meryl Dare, MD   25 mg at 05/18/23 0752   magnesium hydroxide (MILK OF MAGNESIA) suspension 30 mL  30 mL Oral Daily PRN Sindy Guadeloupe, NP       nicotine (NICODERM CQ - dosed in mg/24 hours) patch 21 mg  21 mg Transdermal Daily Massengill, Harrold Donath, MD   21 mg at 05/18/23 1610   nicotine polacrilex (NICORETTE) gum 2 mg  2 mg Oral PRN Phineas Inches, MD  2 mg at 05/18/23 1302   traZODone (DESYREL) tablet 150 mg  150 mg Oral QHS Massengill, Harrold Donath, MD   150 mg at 05/17/23 2113   traZODone (DESYREL) tablet 50 mg  50 mg Oral QHS PRN Meryl Dare, MD        Lab Results:  Results for orders placed or performed during the hospital encounter of 05/16/23 (from the past 48 hour(s))  CBC with Differential/Platelet     Status: Abnormal   Collection Time: 05/18/23  6:34 AM  Result Value Ref Range   WBC 10.6 (H) 4.0 - 10.5 K/uL   RBC 5.29 4.22 - 5.81 MIL/uL   Hemoglobin 16.1 13.0 - 17.0 g/dL   HCT 69.6 29.5 - 28.4 %   MCV 91.9 80.0 - 100.0 fL   MCH 30.4 26.0 - 34.0 pg   MCHC 33.1 30.0 - 36.0 g/dL   RDW 13.2 44.0 - 10.2 %   Platelets 246 150 - 400 K/uL   nRBC 0.0 0.0 - 0.2 %   Neutrophils Relative % 58 %   Neutro Abs 6.3 1.7 - 7.7 K/uL   Lymphocytes Relative 28 %   Lymphs Abs 2.9 0.7 - 4.0 K/uL   Monocytes Relative 9 %   Monocytes Absolute 0.9 0.1 - 1.0 K/uL   Eosinophils Relative 3 %   Eosinophils Absolute 0.3 0.0 - 0.5 K/uL   Basophils Relative 1 %   Basophils Absolute 0.1 0.0 - 0.1 K/uL   Immature Granulocytes 1 %   Abs Immature Granulocytes 0.05 0.00 - 0.07 K/uL    Comment: Performed at [facility], [address], [place], [place] [address]  VITAMIN D 25 Hydroxy (Vit-D Deficiency, Fractures)     Status: Abnormal   Collection Time: 05/18/23  6:34 AM  Result Value Ref  Range   Vit D, 25-Hydroxy 23.83 (L) 30 - 100 ng/mL    Comment: (NOTE) Vitamin D deficiency has been defined by the Institute of Medicine  and an Endocrine Society practice guideline as a level of serum 25-OH  vitamin D less than 20 ng/mL (1,2). The Endocrine Society went on to  further define vitamin D insufficiency as a level between 21 and 29  ng/mL (2).  1. IOM (Institute of Medicine). 2010. Dietary reference intakes for  calcium and D. [place]: [employer]. 2. Holick MF, Binkley , Bischoff-Ferrari HA, et al. Evaluation,  treatment, and prevention of vitamin D deficiency: an Endocrine  Society clinical practice guideline, JCEM. 2011 Jul; 96(7): 1911-30.  Performed at [facility], [address] [place], [place] [address]     Blood Alcohol level:  Lab Results  Component Value Date   ETH <10 05/15/2023   ETH <10 12/09/2018    Metabolic Disorder Labs: Lab Results  Component Value Date   HGBA1C 5.5 05/15/2023   MPG 111.15 05/15/2023   MPG 94 09/19/2016   No results found for: "PROLACTIN" Lab Results  Component Value Date   CHOL 232 (H) 05/15/2023   TRIG 121 05/15/2023   HDL 59 05/15/2023   CHOLHDL 3.9 05/15/2023   VLDL 24 05/15/2023   LDLCALC 149 (H) 05/15/2023   LDLCALC 118 (H) 09/19/2016    Physical Findings: AIMS:  , ,  ,  ,    CIWA:    COWS:     Musculoskeletal: Strength & Muscle Tone: within normal limits Gait & Station: normal Patient leans: N/A  Psychiatric Specialty Exam:  Presentation  General Appearance:  Appropriate for  Environment  Eye Contact: Good  Speech: Normal Rate  Speech Volume: Normal  Handedness: Right   Mood and Affect  Mood: Euthymic  Affect: Appropriate   Thought Process  Thought Processes: Coherent  Descriptions of Associations:Intact  Orientation:Full (Time, Place and Person)  Thought Content:Logical  History of Schizophrenia/Schizoaffective disorder:No  Duration of  Psychotic Symptoms:No data recorded Hallucinations:Hallucinations: None  Ideas of Reference:None  Suicidal Thoughts:Suicidal Thoughts: No  Homicidal Thoughts:Homicidal Thoughts: No   Sensorium  Memory: Immediate Good; Recent Good; Remote Good  Judgment: Fair  Insight: Fair   Art therapist  Concentration: Good  Attention Span: Good  Recall: Good  Fund of Knowledge: Good  Language: Good   Psychomotor Activity  Psychomotor Activity: Psychomotor Activity: Increased   Assets  Assets: Desire for Improvement; Housing; Social Support; Vocational/Educational   Sleep  Sleep: Sleep: Fair (Had nightmares and trouble sleeping but likely secondary to leaving nicotine patch on by accident.) Number of Hours of Sleep: 4    Physical Exam: Physical Exam Vitals and nursing note reviewed.  HENT:     Head: Normocephalic and atraumatic.  Pulmonary:     Effort: Pulmonary effort is normal.  Neurological:     General: No focal deficit present.     Mental Status: He is alert.    Review of Systems  Constitutional:  Negative for fever.  Cardiovascular:  Negative for chest pain and palpitations.  Gastrointestinal:  Negative for constipation, diarrhea, nausea and vomiting.  Neurological:  Negative for dizziness, weakness and headaches.   Blood pressure 131/85, pulse 67, temperature 97.6 F (36.4 C), temperature source Oral, resp. rate 17, height 6' (1.829 m), weight 83.5 kg, SpO2 100%. Body mass index is 24.95 kg/m.   ASSESSMENT & PLAN   ASSESSMENT:   Diagnoses / Active Problems: Intermittent explosive disorder in adult GAD with social anxiety and panic attacks MDD Cannabis use Nicotine withdrawal   Jaime Nixon 27 year old male with past psychiatric history of MDD and hospitalization for SA in 2017 who presents with persistent anger, frequent panic attack like episodes, and rare severe explosive rages.  Medical history significant for severe TBI at  age 89 resulting in seizure disorder (currently not being treated).  Developmental history of father with anger outbursts.  Patient meets criteria for MDD and generalized anxiety disorder with social phobia and panic attacks.  Patient also has significant insomnia per patient report and per wife report.  Given the presence of GAD, MDD, and intermittent explosive disorder we will start an SSRI, specifically fluoxetine which has the most evidence for IED.  We will also start trazodone for insomnia.  Given the patient's panic attacks, we will start hydroxyzine 25 mg as needed 3 times daily for anxiety.  Patient could be a candidate for an anticonvulsive with off-label use evidence for IED, specially since he needs to be on medication for seizure disorder anyway, but we will not start here as we do not know the type of seizures he experiences.  7/20: Increased trazodone to 150 once at night.  Started gabapentin 200 mg 3 times daily.   7/21: Tolerating medications okay.  We will not make any medication adjustments today.  For leukocytosis, no concern for infection as evident by no symptoms of infection, down trending WBC, only slightly elevated WBC, recent significant stressors which could explain reactive leukocytosis.  For vitamin D deficiency, does not warrant any inpatient treatment, plan below.  Plan to discharge tomorrow home with spouse.   PLAN: Safety and Monitoring:             --  Involuntary admission to inpatient psychiatric unit for safety, stabilization and treatment             -- Daily contact with patient to assess and evaluate symptoms and progress in treatment             -- Patient's case to be discussed in multi-disciplinary team meeting             -- Observation Level : q15 minute checks             -- Vital signs:  q12 hours             -- Precautions: suicide, elopement, and assault   2. Psychiatric Diagnoses and Treatment:  -- Continue gabapentin 200 mg 3 times daily for GAD,  neuropathic pain -- Continue fluoxetine 20 mg once daily for IED, GAD, MDD             -- Continue trazodone 150 mg once at night for insomnia             -- Continue trazodone 50 mg as needed once a night for insomnia             -- Continue hydroxyzine 25 mg as needed 3 times daily for anxiety --  The risks/benefits/side-effects/alternatives to this medication were discussed in detail with the patient and time was given for questions. The patient consents to medication trial.              -- Metabolic profile and EKG monitoring obtained while on an atypical antipsychotic. See #4 below for values.              -- Encouraged patient to participate in unit milieu and in scheduled group therapies              -- Short Term Goals: Ability to identify changes in lifestyle to reduce recurrence of condition will improve, Ability to verbalize feelings will improve, Ability to disclose and discuss suicidal ideas, Ability to demonstrate self-control will improve, Ability to identify and develop effective coping behaviors will improve, Ability to maintain clinical measurements within normal limits will improve, Compliance with prescribed medications will improve, and Ability to identify triggers associated with substance abuse/mental health issues will improve             -- Long Term Goals: Improvement in symptoms so as ready for discharge                3. Medical Issues Being Addressed:              -- Seizure disorder: Not starting medication at this time but encouraging the patient to get established with a primary care provider for a neurology referral.  Will ask social worker if they can get the patient a PCP--he has Medicaid.  -- Vitamin D deficiency: will not start treatment inpatient. Encourage patient to consume adequate vitamin D containing (often fortified) food and/or supplement with the counter vitamin D.  Follow-up with primary care physician once established. Will include in discharge  instruction.    4. Routine and other pertinent labs reviewed: Vitamin D level 7/21: 24 (low) Repeat CBC with differential 7/21: WBC count down trending to 10.6 (down from 14.7)  EKG monitoring: QTc: 367   Metabolism / endocrine: BMI: Body mass index is 24.95 kg/m.    Lipid Panel:  Recent Labs       Lab Results  Component Value Date    CHOL 232 (H) 05/15/2023  TRIG 121 05/15/2023    HDL 59 05/15/2023    CHOLHDL 3.9 05/15/2023    VLDL 24 05/15/2023    LDLCALC 149 (H) 05/15/2023    LDLCALC 118 (H) 09/19/2016      HbgA1c: Last Labs     Hgb A1c MFr Bld (%)  Date Value  05/15/2023 5.5      TSH: Last Labs     TSH (uIU/mL)  Date Value  05/15/2023 1.645        Labs to order: none   5. Discharge Planning:              -- Social work and case management to assist with discharge planning and identification of hospital follow-up needs prior to discharge             -- Estimated LOS: Discharge tomorrow on 7/21             -- Discharge Concerns: Need to establish a safety plan; Medication compliance and effectiveness             -- Discharge Goals: Return home with outpatient referrals for mental health follow-up including medication management/psychotherapy       I certify that inpatient services furnished can reasonably be expected to improve the patient's condition.     Meryl Dare, MD 05/18/2023, 3:02 PM

## 2023-05-18 NOTE — [facility] Group Notes (Signed)
[facility] Group Notes:  (Nursing/MHT/Case Management/Adjunct)  Date:  05/18/2023  Time:  9:10 PM  Type of Therapy:   Wrap-up group  Participation Level:  Active  Participation Quality:  Appropriate  Affect:  Appropriate  Cognitive:  Appropriate  Insight:  Appropriate  Engagement in Group:  Engaged  Modes of Intervention:  Education  Summary of Progress/Problems: Pt goal to attend groups. Day 10/10.  Jaime Nixon 05/18/2023, 9:10 PM

## 2023-05-18 NOTE — Progress Notes (Signed)
   05/18/23 0955  Psych Admission Type (Psych Patients Only)  Admission Status Voluntary  Psychosocial Assessment  Patient Complaints Anxiety  Eye Contact Fair  Facial Expression Flat  Affect Appropriate to circumstance  Speech Logical/coherent  Interaction Assertive  Motor Activity Other (Comment) (WDL)  Appearance/Hygiene Unremarkable  Behavior Characteristics Cooperative  Mood Depressed  Thought Process  Coherency WDL  Content WDL  Delusions None reported or observed  Perception WDL  Judgment Impaired  Confusion None  Danger to Self  Current suicidal ideation? Denies  Agreement Not to Harm Self Yes  Description of Agreement verbal  Danger to Others  Danger to Others None reported or observed

## 2023-05-18 NOTE — [facility] Group Notes (Signed)
[facility] Group Notes:  (Nursing/MHT/Case Management/Adjunct)  Date:  05/18/2023  Time:  1:12 PM  Type of Therapy:  Psychoeducational Skills  Participation Level:  Active  Participation Quality:  Appropriate  Affect:  Appropriate  Cognitive:  Appropriate  Insight:  Appropriate  Engagement in Group:  Engaged  Modes of Intervention:  Discussion, Education, and Exploration  Summary of Progress/Problems: Patient attended group in which a podcast was played by Berniece Pap from [employer] podcast discussing mental health and wellness. Tips and tricks on identifying how to support mental wellbeing. Pt attended and was appropriate.  Malva Limes 05/18/2023, 1:12 PM

## 2023-05-18 NOTE — [facility] Suicide Risk Assessment (Signed)
[facility] INPATIENT:  Family/Significant Other Suicide Prevention Education  Suicide Prevention Education:  Education Completed; Jaime, Nixon,  spouse Jaime Nixon [phone] has been identified by the patient as the family member/significant other with whom the patient will be residing, and identified as the person(s) who will aid the patient in the event of a mental health crisis (suicidal ideations/suicide attempt).  With written consent from the patient, the family member/significant other has been provided the following suicide prevention education, prior to the and/or following the discharge of the patient.  Patients spouse reported no safety concerns with the Patients return home. She just want to ensure that he's ready. Clinician provided education and space for the wife to ask questions.   The suicide prevention education provided includes the following: Suicide risk factors Suicide prevention and interventions National Suicide Hotline telephone number [facility] assessment telephone number [facility] Emergency Assistance 911 [facility] and/or Residential Mobile Crisis Unit telephone number  Request made of family/significant other to: Remove weapons (e.g., guns, rifles, knives), all items previously/currently identified as safety concern.   Remove drugs/medications (over-the-counter, prescriptions, illicit drugs), all items previously/currently identified as a safety concern.  The family member/significant other verbalizes understanding of the suicide prevention education information provided.  The family member/significant other agrees to remove the items of safety concern listed above.  Steffanie Dunn LCSWA 05/18/2023, 9:31 AM

## 2023-05-19 DIAGNOSIS — F332 Major depressive disorder, recurrent severe without psychotic features: Secondary | ICD-10-CM

## 2023-05-19 MED ORDER — HYDROXYZINE HCL 25 MG PO TABS: 25.0000 mg | ORAL_TABLET | Freq: Three times a day (TID) | ORAL | 0 refills | Status: DC | PRN

## 2023-05-19 MED ORDER — GABAPENTIN 100 MG PO CAPS: 200.0000 mg | ORAL_CAPSULE | Freq: Three times a day (TID) | ORAL | 0 refills | Status: DC

## 2023-05-19 MED ORDER — FLUOXETINE HCL 20 MG PO CAPS: 20.0000 mg | ORAL_CAPSULE | Freq: Every day | ORAL | 0 refills | Status: DC

## 2023-05-19 MED ORDER — TRAZODONE HCL 150 MG PO TABS: 150.0000 mg | ORAL_TABLET | Freq: Every day | ORAL | 0 refills | Status: DC

## 2023-05-19 NOTE — [facility] Suicide Risk Assessment (Cosign Needed Addendum)
Suicide Risk Assessment  Discharge Assessment    [facility] Discharge Suicide Risk Assessment   Principal Problem: Intermittent explosive disorder in adult Discharge Diagnoses: Principal Problem:   Intermittent explosive disorder in adult Active Problems:   Seizure disorder (HCC)   Tobacco dependence   Cannabis abuse   Suicidal ideations   MDD (major depressive disorder)   GAD (generalized anxiety disorder)  Hospital course:   During the patient's hospitalization, patient had extensive initial psychiatric evaluation, and follow-up psychiatric evaluations every day.   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, insomnia   Patient's psychiatric medications were adjusted on admission:  -- Started fluoxetine 20 mg once daily for IED, GAD, MDD             -- Started trazodone 50 mg once at night for insomnia             -- Started trazodone 50 mg as needed once a night for insomnia             -- Started hydroxyzine 25 mg as needed 3 times daily for anxiety   During the hospitalization, other adjustments were made to the patient's psychiatric medication regimen:  -- Started gabapentin 200 mg 3 times daily for GAD, neuropathic pain -- Increased trazodone to 150 mg once at night for insomnia   Patient's care was discussed during the interdisciplinary team meeting every day during the hospitalization.   The patient is not having side effects to prescribed psychiatric medication.   Gradually, patient started adjusting to milieu. The patient was evaluated each day by a clinical provider to ascertain response to treatment. Improvement was noted by the patient's report of decreasing symptoms, improved sleep and appetite, affect, medication tolerance, behavior, and participation in unit programming.  Patient was asked each day to complete a self inventory noting mood, mental status, pain, new symptoms, anxiety and concerns.   Symptoms  were reported as significantly decreased or resolved completely by discharge.  The patient reports that their mood is stable.  The patient denied having suicidal thoughts for more than 48 hours prior to discharge.  Patient denies having homicidal thoughts.  Patient denies having auditory hallucinations.  Patient denies any visual hallucinations or other symptoms of psychosis.  The patient was motivated to continue taking medication with a goal of continued improvement in mental health.    Patient did not have have response to medications, although he was informed that it takes some time to reach therapeutic effective for SSRIs.  The patient reports overall benefit other psychiatric hospitalization. Supportive psychotherapy was provided to the patient. The patient also participated in regular group therapy while hospitalized. Coping skills, problem solving as well as relaxation therapies were also part of the unit programming.   Labs were reviewed with the patient, and abnormal results were discussed with the patient.   The patient is able to verbalize their individual safety plan to this provider.   # It is recommended to the patient to continue psychiatric medications as prescribed, after discharge from the hospital.     # It is recommended to the patient to follow up with your outpatient psychiatric provider and PCP.   # It was discussed with the patient, the impact of alcohol, drugs, tobacco have been there overall psychiatric and medical wellbeing, and total abstinence from substance use was recommended the patient.ed.   # Prescriptions provided or sent directly to preferred pharmacy at discharge. Patient agreeable to plan. Given  opportunity to ask questions. Appears to feel comfortable with discharge.    # In the event of worsening symptoms, the patient is instructed to call the crisis hotline, 911 and or go to the nearest ED for appropriate evaluation and treatment of symptoms. To follow-up  with primary care provider for other medical issues, concerns and or health care needs   # Patient was discharged home with a plan to follow up as noted below.    Total Time spent with patient: 15 minutes  Musculoskeletal: Strength & Muscle Tone: within normal limits Gait & Station: normal Patient leans: N/A  Psychiatric Specialty Exam  Presentation  General Appearance:  Appropriate for Environment; Casual; Fairly Groomed  Eye Contact: Good  Speech: Normal Rate; Clear and Coherent  Speech Volume: Normal  Handedness: Right   Mood and Affect  Mood: Euthymic; Anxious  Duration of Depression Symptoms: Greater than two weeks  Affect: Appropriate; Congruent; Full Range   Thought Process  Thought Processes: Linear  Descriptions of Associations:Intact  Orientation:Full (Time, Place and Person)  Thought Content:Logical  History of Schizophrenia/Schizoaffective disorder:No  Duration of Psychotic Symptoms:No data recorded Hallucinations:Hallucinations: None  Ideas of Reference:None  Suicidal Thoughts:Suicidal Thoughts: No  Homicidal Thoughts:Homicidal Thoughts: No   Sensorium  Memory: Immediate Good; Recent Good; Remote Good  Judgment: Good  Insight: Good   Executive Functions  Concentration: Good  Attention Span: Good  Recall: Good  Fund of Knowledge: Good  Language: Good   Psychomotor Activity  Psychomotor Activity: Psychomotor Activity: Normal   Assets  Assets: Communication Skills; Desire for Improvement; Housing; Social Support   Sleep  Sleep: Sleep: Poor Number of Hours of Sleep: 4   Physical Exam: Physical Exam Vitals and nursing note reviewed.  HENT:     Head: Normocephalic and atraumatic.  Pulmonary:     Effort: Pulmonary effort is normal.  Skin:    Comments: Rash on billateral legs with medciation cream covering.   Neurological:     General: No focal deficit present.     Mental Status: He is alert.     Review of Systems  Constitutional:  Negative for fever.  Cardiovascular:  Negative for chest pain and palpitations.  Gastrointestinal:  Negative for constipation, diarrhea, nausea and vomiting.  Neurological:  Negative for dizziness, weakness and headaches.   Blood pressure 124/74, pulse 75, temperature 97.9 F (36.6 C), temperature source Oral, resp. rate 16, height 6' (1.829 m), weight 83.5 kg, SpO2 98%. Body mass index is 24.95 kg/m.  Mental Status Per Nursing Assessment::   On Admission:  Suicidal ideation indicated by patient  Demographic Factors:  Male and Caucasian  Loss Factors: Financial problems/change in socioeconomic status  Historical Factors: NA  Risk Reduction Factors:   Responsible for children under 57 years of age, Sense of responsibility to family, Employed, Living with another person, especially a relative, and Positive social support  Continued Clinical Symptoms:  Severe Anxiety and/or Agitation Depression:   Anhedonia Hopelessness Insomnia Severe Alcohol/Substance Abuse/Dependencies  Cognitive Features That Contribute To Risk:  Polarized thinking    Suicide Risk:  Mild:  There are no identifiable suicide plans, no associated intent, mild dysphoria and related symptoms, good self-control (both objective and subjective assessment), few other risk factors, and identifiable protective factors, including available and accessible social support.    Follow-up Information     [facility]. Go on 05/27/2023.   Why: You have an appointment for medication management services on 05/27/23 at 8:00 am with Verdie Shire, PA.  At this time, they will schedule you for a therapy appointment. Contact information: [address], [place], [place] [address]  Phone: [phone]                Plan Of Care/Follow-up recommendations:  Activity: as tolerated   Diet: heart healthy   Other: -Follow-up with your outpatient  psychiatric provider -instructions on appointment date, time, and address (location) are provided to you in discharge paperwork.   -Take your psychiatric medications as prescribed at discharge - instructions are provided to you in the discharge paperwork   -Follow-up with outpatient primary care doctor and other specialists -for management of chronic medical disease, including: Getting reestablished on anticonvulsion medications.   -Testing: Follow-up with outpatient provider for abnormal lab results: None   -Recommend abstinence from alcohol, tobacco, and other illicit drug use at discharge.    -If your psychiatric symptoms recur, worsen, or if you have side effects to your psychiatric medications, call your outpatient psychiatric provider, 911, [phone] or go to the nearest emergency department.   -If suicidal thoughts recur, call your outpatient psychiatric provider, 911, [phone] or go to the nearest emergency department.   Meryl Dare, MD 05/19/2023, 9:35 AM

## 2023-05-19 NOTE — Group Note (Signed)
Date:  05/19/2023 Time:  11:02 AM  Group Topic/Focus:  Emotional Education:   The focus of this group is to discuss what feelings/emotions are, and how they are experienced.    Participation Level:  Active  Participation Quality:  Appropriate  Affect:  Appropriate  Cognitive:  Appropriate  Insight: Appropriate  Engagement in Group:  Engaged  Modes of Intervention:  Activity  Additional Comments:    Donell Beers 05/19/2023, 11:02 AM

## 2023-05-19 NOTE — Plan of Care (Signed)
  Problem: Education: Goal: Knowledge of [facility] Education information/materials will improve Outcome: Adequate for Discharge Goal: Emotional status will improve Outcome: Adequate for Discharge Goal: Mental status will improve Outcome: Adequate for Discharge Goal: Verbalization of understanding the information provided will improve Outcome: Adequate for Discharge   Problem: Activity: Goal: Interest or engagement in activities will improve Outcome: Adequate for Discharge Goal: Sleeping patterns will improve Outcome: Adequate for Discharge   Problem: Coping: Goal: Ability to verbalize frustrations and anger appropriately will improve Outcome: Adequate for Discharge Goal: Ability to demonstrate self-control will improve Outcome: Adequate for Discharge   

## 2023-05-19 NOTE — Discharge Instructions (Signed)

## 2023-05-19 NOTE — Progress Notes (Signed)
Coralie Keens  D/C'd Home per MD order.  Discussed with the patient and all questions fully answered.  An After Visit Summary was printed and given to the patient. Patient received prescription.  D/c education completed with patient including follow up instructions, medication list, d/c activities limitations if indicated, with other d/c instructions as indicated by MD - patient able to verbalize understanding, all questions fully answered.   Patient instructed to return to ED, call 911, or call MD for any changes in condition.   Patient escorted to the entrance, and D/C home via private auto.  Melvenia Needles 05/19/2023 11:51 AM

## 2023-05-19 NOTE — [facility] Suicide Risk Assessment (Signed)
[facility] INPATIENT:  Family/Significant Other Suicide Prevention Education  Suicide Prevention Education:  Education Completed; Joyice Faster ( wife) ,  (name of family member/significant other) has been identified by the patient as the family member/significant other with whom the patient will be residing, and identified as the person(s) who will aid the patient in the event of a mental health crisis (suicidal ideations/suicide attempt).  With written consent from the patient, the family member/significant other has been provided the following suicide prevention education, prior to the and/or following the discharge of the patient.   The suicide prevention education provided includes the following: Suicide risk factors Suicide prevention and interventions National Suicide Hotline telephone number [facility] assessment telephone number [facility] Emergency Assistance 911 [facility] and/or Residential Mobile Crisis Unit telephone number  Request made of family/significant other to: Remove weapons (e.g., guns, rifles, knives), all items previously/currently identified as safety concern.   Remove drugs/medications (over-the-counter, prescriptions, illicit drugs), all items previously/currently identified as a safety concern.  The family member/significant other verbalizes understanding of the suicide prevention education information provided.  The family member/significant other agrees to remove the items of safety concern listed above.  Isabella Bowens 05/19/2023, 9:50 AM

## 2023-05-19 NOTE — Group Note (Signed)
Date:  05/19/2023 Time:  9:40 AM  Group Topic/Focus:  Goals Group:   The focus of this group is to help patients establish daily goals to achieve during treatment and discuss how the patient can incorporate goal setting into their daily lives to aide in recovery.    Participation Level:  Active  Participation Quality:  Appropriate  Affect:  Appropriate  Cognitive:  Appropriate  Insight: Appropriate  Engagement in Group:  Engaged  Modes of Intervention:  Clarification  Additional Comments:  Pt goal: Maintain what I learned here.   Donell Beers 05/19/2023, 9:40 AM
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
## 2023-05-19 NOTE — Progress Notes (Signed)
  [facility] Adult Case Management Discharge Plan :  Will you be returning to the same living situation after discharge:  Yes,  Patient will be returning back home with family  At discharge, do you have transportation home?: Yes,  Patient wife will be picking him up between 11/11:15 AM Do you have the ability to pay for your medications: Yes,  Patient has [employer]  Release of information consent forms completed and in the chart;  Patient's signature needed at discharge.  Patient to Follow up at:  Follow-up Information     [facility]. Go on 05/27/2023.   Why: You have an appointment for medication management services on 05/27/23 at 8:00 am with Verdie Shire, PA.  At this time, they will schedule you for a therapy appointment. Contact information: [address], [place], [place] [address]  Phone: [phone]        Services, [facility]. Call.   Why: Please call this provider to see about scheduling a group anger management class Contact information: [address] [place] [address] [phone]                 Next level of care provider has access to [facility] Link:no  Safety Planning and Suicide Prevention discussed: Yes,  Joyice Faster ( wife ) [phone]     Has patient been referred to the Quitline?: Patient refused referral for treatment  Patient has been referred for addiction treatment: No known substance use disorder.  Isabella Bowens, LCSWA 05/19/2023, 9:50 AM

## 2023-06-02 ENCOUNTER — Ambulatory Visit: Payer: 59 | Admitting: Student

## 2023-06-13 ENCOUNTER — Ambulatory Visit
Admission: EM | Admit: 2023-06-13 | Discharge: 2023-06-13 | Disposition: A | Discharge status: Home / Self Care | Payer: 59 | Attending: Internal Medicine | Admitting: Internal Medicine

## 2023-06-13 ENCOUNTER — Ambulatory Visit: Payer: Self-pay

## 2023-06-13 DIAGNOSIS — N4889 Other specified disorders of penis: Secondary | ICD-10-CM

## 2023-06-13 MED ORDER — NAPROXEN 375 MG PO TABS: 375.0000 mg | ORAL_TABLET | Freq: Two times a day (BID) | ORAL | 0 refills | Status: DC

## 2023-06-13 NOTE — ED Provider Notes (Signed)
[facility]  Note:  This document was prepared using Conservation officer, historic buildings and may include unintentional dictation errors.  MRN: [id number] DOB: 1996/01/07  Subjective:   Jaime Nixon is a 27 y.o. male presenting for 1 day history of acute onset persistent swelling over the shaft of the penis.  He does report feeling queasy and pelvic fullness and discomfort.  Denies dysuria, hematuria, urinary frequency, penile discharge, testicular pain or swelling.  Patient is sexually active with 1 male partner.  Has no concerns for sexually transmitted infection, declines testing.  No current facility-administered medications for this encounter.  Current Outpatient Medications:    FLUoxetine (PROZAC) 20 MG capsule, Take 1 capsule (20 mg total) by mouth daily., Disp: 30 capsule, Rfl: 0   gabapentin (NEURONTIN) 100 MG capsule, Take 2 capsules (200 mg total) by mouth 3 (three) times daily., Disp: 180 capsule, Rfl: 0   hydrOXYzine (ATARAX) 25 MG tablet, Take 1 tablet (25 mg total) by mouth 3 (three) times daily as needed for anxiety., Disp: 30 tablet, Rfl: 0   traZODone (DESYREL) 150 MG tablet, Take 1 tablet (150 mg total) by mouth at bedtime., Disp: 30 tablet, Rfl: 0   Allergies  Allergen Reactions   Dexamethasone Shortness Of Breath and Other (See Comments)    Makes pt angry    Prednisone Other (See Comments)    Makes pt angry     Past Medical History:  Diagnosis Date   Current smoker    History of substance use    Seizures (HCC)      History reviewed. No pertinent surgical history.  Family History  Problem Relation Age of Onset   Seizures Father    Seizures Brother     Social History   Tobacco Use   Smoking status: Every Day    Current packs/day: 1.00    Average packs/day: 1 pack/day for 10.0 years (10.0 ttl pk-yrs)    Types: Cigarettes   Smokeless tobacco: Never  Vaping Use   Vaping status: Never Used  Substance Use Topics   Alcohol  use: Not Currently   Drug use: Not Currently    Types: Marijuana    ROS   Objective:   Vitals: BP 122/80 (BP Location: Right Arm)   Pulse 85   Temp 98.1 F (36.7 C) (Oral)   Resp 20   SpO2 95%   Physical Exam Constitutional:      General: He is not in acute distress.    Appearance: Normal appearance. He is well-developed and normal weight. He is not ill-appearing, toxic-appearing or diaphoretic.  HENT:     Head: Normocephalic and atraumatic.     Right Ear: External ear normal.     Left Ear: External ear normal.     Nose: Nose normal.     Mouth/Throat:     Pharynx: Oropharynx is clear.  Eyes:     General: No scleral icterus.       Right eye: No discharge.        Left eye: No discharge.     Extraocular Movements: Extraocular movements intact.  Cardiovascular:     Rate and Rhythm: Normal rate.  Pulmonary:     Effort: Pulmonary effort is normal.  Genitourinary:    Penis: Circumcised. No phimosis, paraphimosis, hypospadias, erythema, tenderness, discharge, swelling or lesions.      Testes:        Right: Mass, tenderness, swelling, testicular hydrocele or varicocele not present. Right testis is descended. Cremasteric  reflex is present.         Left: Mass, tenderness, swelling, testicular hydrocele or varicocele not present. Left testis is descended. Cremasteric reflex is present.     Musculoskeletal:     Cervical back: Normal range of motion.  Neurological:     Mental Status: He is alert and oriented to person, place, and time.  Psychiatric:        Mood and Affect: Mood normal.        Behavior: Behavior normal.        Thought Content: Thought content normal.        Judgment: Judgment normal.     Assessment and Plan :   PDMP not reviewed this encounter.  1. Penile swelling    Recommended conservative management for now, patient declined STI testing.  Use warm compresses, naproxen for pain and inflammation.  Counseled patient on potential for adverse effects  with medications prescribed/recommended today, ER and return-to-clinic precautions discussed, patient verbalized understanding.    Wallis Bamberg, [place] 06/14/23 [phone]
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
## 2023-06-13 NOTE — Discharge Instructions (Addendum)
Please apply warm compresses to the area 3-5 times daily 5-10 minutes at a time as your schedule allows. Use naproxen for pain and inflammation. If redness, warmth, local pain, drainage, blisters, fever, blood in the urine, discharge from the penis, open sores of the penis develop we need to see you again.

## 2023-08-02 ENCOUNTER — Ambulatory Visit (HOSPITAL_COMMUNITY): Payer: Self-pay

## 2023-08-26 ENCOUNTER — Ambulatory Visit: Payer: Medicaid Other

## 2023-08-26 ENCOUNTER — Ambulatory Visit
Admission: RE | Admit: 2023-08-26 | Discharge: 2023-08-26 | Disposition: A | Discharge status: Home / Self Care | Payer: Medicaid Other | Source: Ambulatory Visit | Attending: Internal Medicine | Admitting: Internal Medicine

## 2023-08-26 VITALS — BP 126/63 | HR 87 | Temp 98.4°F | Resp 17

## 2023-08-26 DIAGNOSIS — S93401A Sprain of unspecified ligament of right ankle, initial encounter: Secondary | ICD-10-CM

## 2023-08-26 DIAGNOSIS — H6993 Unspecified Eustachian tube disorder, bilateral: Secondary | ICD-10-CM | POA: Diagnosis not present

## 2023-08-26 MED ORDER — FLUTICASONE PROPIONATE 50 MCG/ACT NA SUSP
1.0000 | Freq: Every day | NASAL | 0 refills | Status: DC
Start: 2023-08-26 — End: 2023-09-10

## 2023-08-26 NOTE — ED Triage Notes (Signed)
Pt presents with c/o rt ankle pain. States he fell 1 wk ago. Reports he has trouble walking on it, has taken advil.   C/o bilateral ear popping when swallowing.

## 2023-08-26 NOTE — ED Provider Notes (Signed)
[facility]    CSN: [id number] Arrival date & time: 08/26/23  1441      History   Chief Complaint Chief Complaint  Patient presents with   Ankle Pain    Can't hear from right ear - Entered by patient   Appointment    HPI Jaime Nixon is a 27 y.o. male presents for ankle pain and ear fullness.  Patient reports 1 week ago he was trying to get a cat out of a tree when he fell about 2 feet rolling his right ankle.  Reports he has had some pain and swelling that seemed to improve but yesterday worsened.  Denies any bruising or numbness or tingling.  No history of surgeries or fractures to this area in the past.  He does report pain improves with ibuprofen.  In addition he reports a couple weeks of bilateral ear fullness/popping sensation when he swallows.  No ear pain or drainage.  Does state he is getting over a cold.  No other concerns at this time.   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 No pertinent surgical history.     Home Medications    Prior to Admission medications   Medication Sig Start Date End Date Taking? Authorizing Provider  fluticasone (FLONASE) 50 MCG/ACT nasal spray Place 1 spray into both nostrils daily. 08/26/23  Yes Radford Pax, NP  FLUoxetine (PROZAC) 20 MG capsule Take 1 capsule (20 mg total) by mouth daily. 05/20/23   Massengill, Harrold Donath, MD  gabapentin (NEURONTIN) 100 MG capsule Take 2 capsules (200 mg total) by mouth 3 (three) times daily. 05/19/23 06/18/23  Massengill, Harrold Donath, MD  hydrOXYzine (ATARAX) 25 MG tablet Take 1 tablet (25 mg total) by mouth 3 (three) times daily as needed for anxiety. 05/19/23   Massengill, Harrold Donath, MD  naproxen (NAPROSYN) 375 MG tablet Take 1 tablet (375 mg total) by mouth 2 (two) times daily with a meal. 06/13/23   Wallis Bamberg, PA-C  traZODone (DESYREL) 150 MG tablet Take 1 tablet (150 mg total) by mouth at bedtime. 05/19/23   Massengill, Harrold Donath, MD  levETIRAcetam (KEPPRA) 500 MG tablet Take 1 tablet (500 mg total) by mouth 2 (two) times daily. 12/13/18 02/07/20  Arthor Captain, PA-C    Family  History Family History  Problem Relation Age of Onset   Seizures Father    Seizures Brother     Social History Social History   Tobacco Use   Smoking status: Every Day    Current packs/day: 1.00    Average packs/day: 1 pack/day for 10.0 years (10.0 ttl pk-yrs)    Types: Cigarettes   Smokeless tobacco: Never  Vaping Use   Vaping status: Never Used  Substance Use Topics   Alcohol use: Not Currently   Drug use: Not Currently    Types: Marijuana     Allergies   Dexamethasone and Prednisone   Review of Systems Review of Systems  HENT:         Bilateral ear fullness  Musculoskeletal:        Right  ankle pain     Physical Exam Triage Vital Signs ED Triage Vitals  Encounter Vitals Group     BP 08/26/23 1523 126/63     Systolic BP Percentile --      Diastolic BP Percentile --      Pulse Rate 08/26/23 1523 87     Resp 08/26/23 1523 17     Temp 08/26/23 1523 98.4 F (36.9 C)     Temp Source 08/26/23 1523 Oral     SpO2 08/26/23 1523 96 %     Weight --      Height --      Head Circumference --      Peak Flow --      Pain Score 08/26/23 1522 5     Pain Loc --      Pain Education --      Exclude from Growth Chart --    No data found.  Updated Vital Signs BP 126/63 (BP Location: Left Arm)   Pulse 87   Temp 98.4 F (36.9 C) (Oral)   Resp 17   SpO2 96%   Visual Acuity Right Eye Distance:   Left Eye Distance:   Bilateral Distance:    Right Eye Near:   Left Eye Near:    Bilateral Near:     Physical Exam Vitals and nursing note reviewed.  Constitutional:      General: He is not in acute distress.    Appearance: Normal appearance. He is not ill-appearing.  HENT:     Head: Normocephalic and atraumatic.     Right Ear: A middle ear effusion is present. Tympanic membrane is not erythematous.     Left Ear: A middle ear effusion is present. Tympanic membrane is not erythematous.  Eyes:     Pupils: Pupils are equal, round, and reactive to light.  Cardiovascular:     Rate and Rhythm: Normal rate.  Pulmonary:     Effort: Pulmonary effort is normal.  Musculoskeletal:     Right ankle: No swelling, deformity, ecchymosis or lacerations. Tenderness present. No lateral malleolus, medial malleolus, posterior TF ligament, base of 5th metatarsal or proximal fibula tenderness. Normal range of motion.       Legs:     Comments: Full range of motion of ankle with pain on dorsiflexion and extension.  DP +2.  Strength 5/5 bilateral lower extremities.  Skin:    General: Skin is warm and dry.  Neurological:     General: No focal deficit present.     Mental Status: He is alert and  oriented to person, place, and time.  Psychiatric:        Mood and Affect: Mood normal.  Behavior: Behavior normal.      UC Treatments / Results  Labs (all labs ordered are listed, but only abnormal results are displayed) Labs Reviewed - No data to display  EKG   Radiology No results found.  Procedures Procedures (including critical care time)  Medications Ordered in UC Medications - No data to display  Initial Impression / Assessment and Plan / UC Course  I have reviewed the triage vital signs and the nursing notes.  Pertinent labs & imaging results that were available during my care of the patient were reviewed by me and considered in my medical decision making (see chart for details).     Reviewed exam and symptoms with patient.  No red flags.  Wet read of x-ray with possible abnormality on AP view of the distal fibula.  Not seen in other views.  Discussed this with patient.  Will place in cam boot as he does report symptoms are worsening and have him follow-up with orthopedics if it does not improve.  Will contact patient with any abnormalities with radiology overread.  He can continue over-the-counter ibuprofen as needed, also reviewed RICE therapy.  Discussed eustachian tube dysfunction, start Flonase daily.  Advised PCP follow-up if symptoms do not improve.  Strict ER precautions reviewed and patient verbalized understanding. Final Clinical Impressions(s) / UC Diagnoses   Final diagnoses:  Sprain of right ankle, unspecified ligament, initial encounter  Dysfunction of both eustachian tubes   Discharge Instructions   None    ED Prescriptions     Medication Sig Dispense Auth. Provider   fluticasone (FLONASE) 50 MCG/ACT nasal spray Place 1 spray into both nostrils daily. 15.8 mL Radford Pax, NP      PDMP not reviewed this encounter.   Radford Pax, NP 08/26/23 1616

## 2023-08-26 NOTE — Discharge Instructions (Signed)
Start Flonase daily.  You may use the cam boot for your ankle pain.  You may also elevate and ice as needed.  Continue over-the-counter ibuprofen as needed.  Please follow-up with orthopedics if your symptoms do not improve.  Please go to the ER for any worsening symptoms.  I hope you feel better soon!

## 2023-09-10 ENCOUNTER — Emergency Department (HOSPITAL_COMMUNITY)
Admission: EM | Admit: 2023-09-10 | Discharge: 2023-09-10 | Disposition: A | Discharge status: Home / Self Care | Payer: Self-pay

## 2023-09-10 ENCOUNTER — Emergency Department (HOSPITAL_COMMUNITY): Payer: Self-pay

## 2023-09-10 ENCOUNTER — Encounter (HOSPITAL_COMMUNITY): Payer: Self-pay

## 2023-09-10 ENCOUNTER — Other Ambulatory Visit: Payer: Self-pay

## 2023-09-10 ENCOUNTER — Ambulatory Visit: Payer: Self-pay

## 2023-09-10 DIAGNOSIS — L0291 Cutaneous abscess, unspecified: Secondary | ICD-10-CM

## 2023-09-10 DIAGNOSIS — K122 Cellulitis and abscess of mouth: Secondary | ICD-10-CM | POA: Insufficient documentation

## 2023-09-10 LAB — I-STAT CG4 LACTIC ACID, ED
Lactic Acid, Venous: 0.8 mmol/L (ref 0.5–1.9)
Lactic Acid, Venous: 0.9 mmol/L (ref 0.5–1.9)

## 2023-09-10 LAB — COMPREHENSIVE METABOLIC PANEL
ALT: 13 U/L (ref 0–44)
AST: 34 U/L (ref 15–41)
Albumin: 4.6 g/dL (ref 3.5–5.0)
Alkaline Phosphatase: 92 U/L (ref 38–126)
Anion gap: 14 (ref 5–15)
BUN: 15 mg/dL (ref 6–20)
CO2: 19 mmol/L — ABNORMAL LOW (ref 22–32)
Calcium: 9.8 mg/dL (ref 8.9–10.3)
Chloride: 100 mmol/L (ref 98–111)
Creatinine, Ser: 0.9 mg/dL (ref 0.61–1.24)
GFR, Estimated: >60 mL/min (ref >60)
Glucose, Bld: 125 mg/dL — ABNORMAL HIGH (ref 70–99)
Potassium: 3.9 mmol/L (ref 3.5–5.1)
Sodium: 133 mmol/L — ABNORMAL LOW (ref 135–145)
Total Bilirubin: 0.7 mg/dL (ref <1.2)
Total Protein: 8.3 g/dL — ABNORMAL HIGH (ref 6.5–8.1)

## 2023-09-10 LAB — CBC WITH DIFFERENTIAL/PLATELET
Abs Immature Granulocytes: 0.2 10*3/uL — ABNORMAL HIGH (ref 0.00–0.07)
Basophils Absolute: 0.1 10*3/uL (ref 0.0–0.1)
Basophils Relative: 0 %
Eosinophils Absolute: 0 10*3/uL (ref 0.0–0.5)
Eosinophils Relative: 0 %
HCT: 49.4 % (ref 39.0–52.0)
Hemoglobin: 16.9 g/dL (ref 13.0–17.0)
Immature Granulocytes: 1 %
Lymphocytes Relative: 5 %
Lymphs Abs: 1.4 10*3/uL (ref 0.7–4.0)
MCH: 30.8 pg (ref 26.0–34.0)
MCHC: 34.2 g/dL (ref 30.0–36.0)
MCV: 90 fL (ref 80.0–100.0)
Monocytes Absolute: 2.1 10*3/uL — ABNORMAL HIGH (ref 0.1–1.0)
Monocytes Relative: 8 %
Neutro Abs: 23.7 10*3/uL — ABNORMAL HIGH (ref 1.7–7.7)
Neutrophils Relative %: 86 %
Platelets: 330 10*3/uL (ref 150–400)
RBC: 5.49 MIL/uL (ref 4.22–5.81)
RDW: 13.3 % (ref 11.5–15.5)
WBC: 27.5 10*3/uL — ABNORMAL HIGH (ref 4.0–10.5)
nRBC: 0 % (ref 0.0–0.2)

## 2023-09-10 LAB — URINALYSIS, W/ REFLEX TO CULTURE (INFECTION SUSPECTED)
Bacteria, UA: NONE SEEN
Bilirubin Urine: NEGATIVE
Glucose, UA: NEGATIVE mg/dL
Hgb urine dipstick: NEGATIVE
Ketones, ur: 20 mg/dL — AB
Leukocytes,Ua: NEGATIVE
Nitrite: NEGATIVE
Protein, ur: NEGATIVE mg/dL
Specific Gravity, Urine: >1.046 — ABNORMAL HIGH (ref 1.005–1.030)
pH: 5 (ref 5.0–8.0)

## 2023-09-10 MED ORDER — CLINDAMYCIN HCL 150 MG PO CAPS: 450.0000 mg | ORAL_CAPSULE | Freq: Three times a day (TID) | ORAL | 0 refills | Status: AC

## 2023-09-10 MED ORDER — LIDOCAINE-EPINEPHRINE 1 %-1:100000 IJ SOLN
20.0000 mL | Freq: Once | INTRAMUSCULAR | Status: AC
  Administered 2023-09-10: 20 mL
  Filled 2023-09-10: qty 1

## 2023-09-10 MED ORDER — OXYCODONE HCL 5 MG PO TABS
5.0000 mg | ORAL_TABLET | Freq: Once | ORAL | Status: DC
  Filled 2023-09-10: qty 1

## 2023-09-10 MED ORDER — MORPHINE SULFATE (PF) 2 MG/ML IV SOLN
2.0000 mg | Freq: Once | INTRAVENOUS | Status: AC
  Administered 2023-09-10: 2 mg via INTRAVENOUS
  Filled 2023-09-10: qty 1

## 2023-09-10 MED ORDER — IOHEXOL 300 MG/ML  SOLN
75.0000 mL | Freq: Once | INTRAMUSCULAR | Status: AC | PRN
  Administered 2023-09-10: 75 mL via INTRAVENOUS

## 2023-09-10 MED ORDER — SODIUM CHLORIDE 0.9 % IV BOLUS
1000.0000 mL | Freq: Once | INTRAVENOUS | Status: AC
  Administered 2023-09-10: 1000 mL via INTRAVENOUS

## 2023-09-10 MED ORDER — ONDANSETRON 4 MG PO TBDP
4.0000 mg | ORAL_TABLET | Freq: Once | ORAL | Status: AC
  Administered 2023-09-10: 4 mg via ORAL
  Filled 2023-09-10: qty 1

## 2023-09-10 MED ORDER — CLINDAMYCIN PHOSPHATE 600 MG/50ML IV SOLN
600.0000 mg | Freq: Once | INTRAVENOUS | Status: AC
  Administered 2023-09-10: 600 mg via INTRAVENOUS
  Filled 2023-09-10: qty 50

## 2023-09-10 NOTE — Discharge Instructions (Signed)
Please follow-up with the oral facial surgeon and a dentist.  Take the antibiotics as prescribed.  He may take over-the-counter Tylenol alternating with Motrin.  Please return if you develop fevers, sudden onset headache, difficulty swallowing, difficulty breathing or he develop any new or worsening symptoms that are concerning to you.

## 2023-09-10 NOTE — ED Provider Notes (Incomplete)
[place] EMERGENCY DEPARTMENT AT [facility] Provider Note   CSN: [id number] Arrival date & time: 09/10/23  1854     History {Add pertinent medical, surgical, social history, OB history to HPI:1} Chief Complaint  Patient presents with  . Oral Swelling    Jaime Nixon is a 27 y.o. male.  Presenting to the emergency department for facial pain x 3 days.  Facial swelling started today.  He has had broken tooth on the left side for some time.  No fevers or chills.  Painful eating, decreased p.o. intake.  Has not seen a dentist.  No painful swallowing.  Voice sounds normal to him.  No difficulty breathing.        Home Medications Prior to Admission medications   Medication Sig Start Date End Date Taking? Authorizing Provider  acetaminophen (TYLENOL) 500 MG tablet Take 4 tablets by mouth every 6 (six) hours as needed for moderate pain (pain score 4-6) or mild pain (pain score 1-3).   Yes [provider]  ibuprofen (ADVIL) 200 MG tablet Take 800 mg by mouth every 6 (six) hours as needed for mild pain (pain score 1-3) or moderate pain (pain score 4-6).   Yes [provider]  UNABLE TO FIND Take 4 tablets by mouth daily as needed. Med Name: [place] Pain Medication   Yes [provider]  levETIRAcetam (KEPPRA) 500 MG tablet Take 1 tablet (500 mg total) by mouth 2 (two) times daily. 12/13/18 02/07/20  Arthor Captain, PA-C      Allergies    Dexamethasone and Prednisone    Review of Systems   Review of Systems  Physical Exam Updated Vital Signs BP (!) 140/78 (BP Location: Right Arm)   Pulse (!) 118   Temp 98.2 F (36.8 C)   Resp 15   Ht 6' (1.829 m)   Wt 90.7 kg   SpO2 97%   BMI 27.12 kg/m  Physical Exam Vitals and nursing note reviewed.  HENT:     Head: Normocephalic.     Mouth/Throat:     Comments: Poor dentition, does have some swelling to the left maxilla, cheek.  Some fluctuance noted to the left gumline as well.  No obvious  periapical abscess.  Uvula midline.  Floor of mouth is soft. Cardiovascular:     Pulses: Normal pulses.  Pulmonary:     Effort: Pulmonary effort is normal.  Abdominal:     Palpations: Abdomen is soft.  Musculoskeletal:     Cervical back: Neck supple.  Neurological:     General: No focal deficit present.     Mental Status: He is alert and oriented to person, place, and time.  Psychiatric:        Mood and Affect: Mood normal.     ED Results / Procedures / Treatments   Labs (all labs ordered are listed, but only abnormal results are displayed) Labs Reviewed  COMPREHENSIVE METABOLIC PANEL - Abnormal; Notable for the following components:      Result Value   Sodium 133 (*)    CO2 19 (*)    Glucose, Bld 125 (*)    Total Protein 8.3 (*)    All other components within normal limits  CBC WITH DIFFERENTIAL/PLATELET - Abnormal; Notable for the following components:   WBC 27.5 (*)    Neutro Abs 23.7 (*)    Monocytes Absolute 2.1 (*)    Abs Immature Granulocytes 0.20 (*)    All other components within normal limits  URINALYSIS, W/ REFLEX TO CULTURE (INFECTION SUSPECTED) - Abnormal; Notable for the following components:   Specific Gravity, Urine >1.046 (*)    Ketones, ur 20 (*)    All other components within normal limits  I-STAT CG4 LACTIC ACID, ED  I-STAT CG4 LACTIC ACID, ED    EKG None  Radiology CT Soft Tissue Neck W Contrast  Result Date: 09/10/2023 CLINICAL DATA:  left sided facial swelling EXAM: CT NECK WITH CONTRAST TECHNIQUE: Multidetector CT imaging of the neck was performed using the standard protocol following the bolus administration of intravenous contrast. RADIATION DOSE REDUCTION: This exam was performed according to the departmental dose-optimization program which includes automated exposure control, adjustment of the mA and/or kV according to patient size and/or use of iterative reconstruction technique. CONTRAST:  75mL OMNIPAQUE IOHEXOL 300 MG/ML  SOLN  COMPARISON:  None Available. FINDINGS: Pharynx and larynx: Normal. No mass or swelling. Salivary glands: No inflammation, mass, or stone. Thyroid: Normal. Lymph nodes: Mildly prominent upper cervical chain lymph nodes, likely reactive given the above findings. Vascular: Not well evaluated due to contrast timing. Limited intracranial: Negative. Visualized orbits: Negative. Mastoids and visualized paranasal sinuses: Moderate to severe left maxillary sinus mucosal thickening. Remaining sinuses are mostly clear. Skeleton: No acute abnormality. Upper chest: Visualized lung apices are clear. Other: 2.8 x 0.7 x 2.0 cm peripherally enhancing fluid collection along the left maxilla, compatible with abscess. Surrounding edema and soft tissue thickening, compatible with phlegmon/abscess. IMPRESSION: 1. Approximately 2.8 cm abscess along the left maxilla with surrounding phlegmon/cellulitis. 2. The above is likely odontogenic given periapical lucency with overlying cortical dehiscence of the left first premolar. 3. Left maxillary sinus disease, probably also odontogenic. Electronically Signed   By: Feliberto Harts M.D.   On: 09/10/2023 22:15    Procedures .Marland KitchenIncision and Drainage  Date/Time: 09/10/2023 11:33 PM  Performed by: Coral Spikes, DO Authorized by: Coral Spikes, DO   Consent:    Consent obtained:  Verbal   Consent given by:  Patient   Risks discussed:  Bleeding, incomplete drainage, pain and infection   Alternatives discussed:  No treatment Universal protocol:    Patient identity confirmed:  Verbally with patient Location:    Type:  Abscess   Size:  2.8   Location:  Mouth   Mouth location:  Alveolar process Anesthesia:    Anesthesia method:  Local infiltration   Local anesthetic:  Lidocaine 1% WITH epi Procedure type:    Complexity:  Simple Procedure details:    Ultrasound guidance: no     Needle aspiration: no     Incision types:  Stab incision   Incision depth:  Submucosal    Drainage:  Bloody and purulent   Drainage amount:  Moderate   Wound treatment:  Wound left open   Packing materials:  None Post-procedure details:    Procedure completion:  Tolerated   {Document cardiac monitor, telemetry assessment procedure when appropriate:1}  Medications Ordered in ED Medications  clindamycin (CLEOCIN) IVPB 600 mg (600 mg Intravenous New Bag/Given 09/10/23 2312)  ondansetron (ZOFRAN-ODT) disintegrating tablet 4 mg (4 mg Oral Given 09/10/23 2057)  morphine (PF) 2 MG/ML injection 2 mg (2 mg Intravenous Given 09/10/23 2117)  iohexol (OMNIPAQUE) 300 MG/ML solution 75 mL (75 mLs Intravenous Contrast Given 09/10/23 2139)  sodium chloride 0.9 % bolus 1,000 mL (1,000 mLs Intravenous New Bag/Given 09/10/23 2150)  lidocaine-EPINEPHrine (XYLOCAINE W/EPI) 1 %-1:100000 (with pres) injection 20 mL (20 mLs Infiltration Given 09/10/23 2313)    ED Course/ Medical  Decision Making/ A&P Clinical Course as of 09/10/23 2330  Wed Sep 10, 2023  2223 CT Soft Tissue Neck W Contrast IMPRESSION: 1. Approximately 2.8 cm abscess along the left maxilla with surrounding phlegmon/cellulitis. 2. The above is likely odontogenic given periapical lucency with overlying cortical dehiscence of the left first premolar. 3. Left maxillary sinus disease, probably also odontogenic.   [TY]  2247 Heart rate is improved down to the low 100s after IV fluids and pain medications.  Patient consented for attempt at bedside I&D of his maxillary abscess. [TY]  2325 Patient began having some drainage of pus from left gums.  Bedside incision and drainage still performed, had some bloody purulent discharge from wound.  Heart rate is improved.  Tolerating p.o.  Feel that he is stable for discharge at this time.  Will be given follow-up with OMFS and encouraged to follow-up with dentist as well.  Strict return precautions given. [TY]    Clinical Course User Index [TY] Coral Spikes, DO   {   Click here for ABCD2,  HEART and other calculatorsREFRESH Note before signing :1}                              Medical Decision Making 27 year old male present emergency department with facial swelling in the setting of dental caries.  Per chart review appears to have similar abscess/cellulitis issues in the same location.  Treated provider ordered labs given patient's elevated heart rate.  Does have leukocytosis, but no elevated lactate.  I suspect elevated heart rate secondary to pain and decreased p.o. intake rather than systemic infection.  He has no evidence of PTA/retropharyngeal abscess.  No angioedema or findings consistent with Ludwick's angina.  He is tolerating p.o. here in the emergency department.  CT scan with 2.8 cm abscess.  Bedside incision and drainage performed.  Given dose of IV clindamycin here.  Discharged on the same.  Given referral to OMFS/dentist.  Amount and/or Complexity of Data Reviewed Labs: ordered. Radiology:  Decision-making details documented in ED Course.  Risk Prescription drug management.   ***  {Document critical care time when appropriate:1} {Document review of labs and clinical decision tools ie heart score, Chads2Vasc2 etc:1}  {Document your independent review of radiology images, and any outside records:1} {Document your discussion with family members, caretakers, and with consultants:1} {Document social determinants of health affecting pt's care:1} {Document your decision making why or why not admission, treatments were needed:1} Final Clinical Impression(s) / ED Diagnoses Final diagnoses:  None    Rx / DC Orders ED Discharge Orders     None

## 2023-09-10 NOTE — ED Provider Notes (Signed)
[facility] EMERGENCY DEPARTMENT AT [facility] Provider Note   CSN: [id number] Arrival date & time: 09/10/23  1854     History  Chief Complaint  Patient presents with   Oral Swelling    Jaime Nixon is a 27 y.o. male.  Presenting to the emergency department for facial pain x 3 days.  Facial swelling started today.  He has had broken tooth on the left side for some time.  No fevers or chills.  Painful eating, decreased p.o. intake.  Has not seen a dentist.  No painful swallowing.  Voice sounds normal to him.  No difficulty breathing.        Home Medications Prior to Admission medications   Medication Sig Start Date End Date Taking? Authorizing Provider  acetaminophen (TYLENOL) 500 MG tablet Take 4 tablets by mouth every 6 (six) hours as needed for moderate pain (pain score 4-6) or mild pain (pain score 1-3).   Yes [provider]  clindamycin (CLEOCIN) 150 MG capsule Take 3 capsules (450 mg total) by mouth 3 (three) times daily for 7 days. 09/10/23 09/17/23 Yes Yida Hyams, Harmon Dun, DO  ibuprofen (ADVIL) 200 MG tablet Take 800 mg by mouth every 6 (six) hours as needed for mild pain (pain score 1-3) or moderate pain (pain score 4-6).   Yes [provider]  UNABLE TO FIND Take 4 tablets by mouth daily as needed. Med Name: [place] Pain Medication   Yes [provider]  levETIRAcetam (KEPPRA) 500 MG tablet Take 1 tablet (500 mg total) by mouth 2 (two) times daily. 12/13/18 02/07/20  Arthor Captain, PA-C      Allergies    Dexamethasone and Prednisone    Review of Systems   Review of Systems  Physical Exam Updated Vital Signs BP (!) 134/93   Pulse (!) 108   Temp 98.6 F (37 C) (Oral)   Resp 15   Ht 6' (1.829 m)   Wt 90.7 kg   SpO2 100%   BMI 27.12 kg/m  Physical Exam Vitals and nursing note reviewed.  HENT:     Head: Normocephalic.     Mouth/Throat:     Comments: Poor dentition, does have some swelling to the left maxilla,  cheek.  Some fluctuance noted to the left gumline as well.  No obvious periapical abscess.  Uvula midline.  Floor of mouth is soft. Cardiovascular:     Pulses: Normal pulses.  Pulmonary:     Effort: Pulmonary effort is normal.  Abdominal:     Palpations: Abdomen is soft.  Musculoskeletal:     Cervical back: Neck supple.  Neurological:     General: No focal deficit present.     Mental Status: He is alert and oriented to person, place, and time.  Psychiatric:        Mood and Affect: Mood normal.     ED Results / Procedures / Treatments   Labs (all labs ordered are listed, but only abnormal results are displayed) Labs Reviewed  COMPREHENSIVE METABOLIC PANEL - Abnormal; Notable for the following components:      Result Value   Sodium 133 (*)    CO2 19 (*)    Glucose, Bld 125 (*)    Total Protein 8.3 (*)    All other components within normal limits  CBC WITH DIFFERENTIAL/PLATELET - Abnormal; Notable for the following components:   WBC 27.5 (*)    Neutro Abs 23.7 (*)    Monocytes Absolute 2.1 (*)  Abs Immature Granulocytes 0.20 (*)    All other components within normal limits  URINALYSIS, W/ REFLEX TO CULTURE (INFECTION SUSPECTED) - Abnormal; Notable for the following components:   Specific Gravity, Urine >1.046 (*)    Ketones, ur 20 (*)    All other components within normal limits  I-STAT CG4 LACTIC ACID, ED  I-STAT CG4 LACTIC ACID, ED    EKG None  Radiology CT Soft Tissue Neck W Contrast  Result Date: 09/10/2023 CLINICAL DATA:  left sided facial swelling EXAM: CT NECK WITH CONTRAST TECHNIQUE: Multidetector CT imaging of the neck was performed using the standard protocol following the bolus administration of intravenous contrast. RADIATION DOSE REDUCTION: This exam was performed according to the departmental dose-optimization program which includes automated exposure control, adjustment of the mA and/or kV according to patient size and/or use of iterative reconstruction  technique. CONTRAST:  75mL OMNIPAQUE IOHEXOL 300 MG/ML  SOLN COMPARISON:  None Available. FINDINGS: Pharynx and larynx: Normal. No mass or swelling. Salivary glands: No inflammation, mass, or stone. Thyroid: Normal. Lymph nodes: Mildly prominent upper cervical chain lymph nodes, likely reactive given the above findings. Vascular: Not well evaluated due to contrast timing. Limited intracranial: Negative. Visualized orbits: Negative. Mastoids and visualized paranasal sinuses: Moderate to severe left maxillary sinus mucosal thickening. Remaining sinuses are mostly clear. Skeleton: No acute abnormality. Upper chest: Visualized lung apices are clear. Other: 2.8 x 0.7 x 2.0 cm peripherally enhancing fluid collection along the left maxilla, compatible with abscess. Surrounding edema and soft tissue thickening, compatible with phlegmon/abscess. IMPRESSION: 1. Approximately 2.8 cm abscess along the left maxilla with surrounding phlegmon/cellulitis. 2. The above is likely odontogenic given periapical lucency with overlying cortical dehiscence of the left first premolar. 3. Left maxillary sinus disease, probably also odontogenic. Electronically Signed   By: Feliberto Harts M.D.   On: 09/10/2023 22:15    Procedures .Marland KitchenIncision and Drainage  Date/Time: 09/10/2023 11:33 PM  Performed by: Coral Spikes, DO Authorized by: Coral Spikes, DO   Consent:    Consent obtained:  Verbal   Consent given by:  Patient   Risks discussed:  Bleeding, incomplete drainage, pain and infection   Alternatives discussed:  No treatment Universal protocol:    Patient identity confirmed:  Verbally with patient Location:    Type:  Abscess   Size:  2.8   Location:  Mouth   Mouth location:  Alveolar process Anesthesia:    Anesthesia method:  Local infiltration   Local anesthetic:  Lidocaine 1% WITH epi Procedure type:    Complexity:  Simple Procedure details:    Ultrasound guidance: no     Needle aspiration: no     Incision  types:  Stab incision   Incision depth:  Submucosal   Drainage:  Bloody and purulent   Drainage amount:  Moderate   Wound treatment:  Wound left open   Packing materials:  None Post-procedure details:    Procedure completion:  Tolerated     Medications Ordered in ED Medications  ondansetron (ZOFRAN-ODT) disintegrating tablet 4 mg (4 mg Oral Given 09/10/23 2057)  morphine (PF) 2 MG/ML injection 2 mg (2 mg Intravenous Given 09/10/23 2117)  iohexol (OMNIPAQUE) 300 MG/ML solution 75 mL (75 mLs Intravenous Contrast Given 09/10/23 2139)  sodium chloride 0.9 % bolus 1,000 mL (0 mLs Intravenous Stopped 09/10/23 2342)  clindamycin (CLEOCIN) IVPB 600 mg (0 mg Intravenous Stopped 09/10/23 2342)  lidocaine-EPINEPHrine (XYLOCAINE W/EPI) 1 %-1:100000 (with pres) injection 20 mL (20 mLs Infiltration Given 09/10/23 2313)  ED Course/ Medical Decision Making/ A&P Clinical Course as of 09/11/23 0017  Wed Sep 10, 2023  2223 CT Soft Tissue Neck W Contrast IMPRESSION: 1. Approximately 2.8 cm abscess along the left maxilla with surrounding phlegmon/cellulitis. 2. The above is likely odontogenic given periapical lucency with overlying cortical dehiscence of the left first premolar. 3. Left maxillary sinus disease, probably also odontogenic.   [TY]  2247 Heart rate is improved down to the low 100s after IV fluids and pain medications.  Patient consented for attempt at bedside I&D of his maxillary abscess. [TY]  2325 Patient began having some drainage of pus from left gums.  Bedside incision and drainage still performed, had some bloody purulent discharge from wound.  Heart rate is improved.  Tolerating p.o.  Feel that he is stable for discharge at this time.  Will be given follow-up with [facility]FS and encouraged to follow-up with dentist as well.  Strict return precautions given. [TY]    Clinical Course User Index [TY] Coral Spikes, DO                                 Medical Decision  Making 27 year old male present emergency department with facial swelling in the setting of dental caries.  Per chart review appears to have similar abscess/cellulitis issues in the same location.  Treated provider ordered labs given patient's elevated heart rate.  Does have leukocytosis, but no elevated lactate.  I suspect elevated heart rate secondary to pain and decreased p.o. intake rather than systemic infection.  He has no evidence of PTA/retropharyngeal abscess.  No angioedema or findings consistent with Ludwick's angina.  He is tolerating p.o. here in the emergency department.  CT scan with 2.8 cm abscess.  Bedside incision and drainage performed.  Given dose of IV clindamycin here.  Discharged on the same.  Given referral to OMFS/dentist.  Amount and/or Complexity of Data Reviewed Labs: ordered. Radiology:  Decision-making details documented in ED Course.  Risk Prescription drug management.          Final Clinical Impression(s) / ED Diagnoses Final diagnoses:  Abscess    Rx / DC Orders ED Discharge Orders          Ordered    clindamycin (CLEOCIN) 150 MG capsule  3 times daily        09/10/23 2337              Coral Spikes, DO 09/11/23 0017

## 2023-09-10 NOTE — ED Provider Triage Note (Signed)
Emergency Medicine Provider Triage Evaluation Note  Jaime Nixon , a 27 y.o. male  was evaluated in triage.  Pt complains of left-sided facial began this morning.  Patient states that last Saturday he was eating dinner when he noticed a broken tooth on the left side.  Patient dates the tooth has been bothering him since then but woke up today with facial swelling.  Patient been unable to tolerate solid foods but has been able to drink orange juice.  Patient has been seeing a dentist but is saving up money to make another appointment.  Patient denies any fevers, neck stiffness, headaches, drooling.  Review of Systems  Positive: See HPI Negative: See HPI  Physical Exam  BP 138/84 (BP Location: Right Arm)   Pulse (!) 137   Temp 98.2 F (36.8 C)   Resp 19   Ht 6' (1.829 m)   Wt 90.7 kg   SpO2 98%   BMI 27.12 kg/m  Gen:   Awake, no distress   Resp:  Normal effort  MSK:   Moves extremities without difficulty  Other:  Left facial swelling noted, no muffled voice or drooling, neck does not appear stiff, no adventitious lung sounds  Medical Decision Making  Medically screening exam initiated at 7:28 PM.  Appropriate orders placed.  Jaime Nixon was informed that the remainder of the evaluation will be completed by another provider, this initial triage assessment does not replace that evaluation, and the importance of remaining in the ED until their evaluation is complete.  Workup initiated, labs ordered along with imaging.  Patient stable at this time.   Netta Corrigan, PA-C 09/10/23 1929

## 2023-09-10 NOTE — ED Triage Notes (Signed)
Patient arrived POV from home with complaint of left upper toothache starting 09/06/23.   Reports waking up this am with swelling that has progressively gotten worse.   Patient presents to triage with left upper cheek swelling and redness.

## 2023-09-11 ENCOUNTER — Emergency Department (HOSPITAL_COMMUNITY)
Admission: EM | Admit: 2023-09-11 | Discharge: 2023-09-11 | Disposition: A | Discharge status: Home / Self Care | Payer: Self-pay | Attending: Emergency Medicine | Admitting: Emergency Medicine

## 2023-09-11 ENCOUNTER — Encounter (HOSPITAL_COMMUNITY): Payer: Self-pay | Admitting: Emergency Medicine

## 2023-09-11 DIAGNOSIS — F17200 Nicotine dependence, unspecified, uncomplicated: Secondary | ICD-10-CM | POA: Insufficient documentation

## 2023-09-11 DIAGNOSIS — R220 Localized swelling, mass and lump, head: Secondary | ICD-10-CM

## 2023-09-11 DIAGNOSIS — R000 Tachycardia, unspecified: Secondary | ICD-10-CM | POA: Insufficient documentation

## 2023-09-11 DIAGNOSIS — K047 Periapical abscess without sinus: Secondary | ICD-10-CM | POA: Insufficient documentation

## 2023-09-11 MED ORDER — CLINDAMYCIN PHOSPHATE 600 MG/50ML IV SOLN
600.0000 mg | Freq: Once | INTRAVENOUS | Status: DC
  Filled 2023-09-11: qty 50

## 2023-09-11 MED ORDER — LACTATED RINGERS IV BOLUS
1000.0000 mL | Freq: Once | INTRAVENOUS | Status: AC
  Administered 2023-09-11: 1000 mL via INTRAVENOUS

## 2023-09-11 MED ORDER — SODIUM CHLORIDE 0.9 % IV SOLN
3.0000 g | Freq: Once | INTRAVENOUS | Status: AC
  Administered 2023-09-11: 3 g via INTRAVENOUS
  Filled 2023-09-11: qty 8

## 2023-09-11 NOTE — ED Provider Notes (Signed)
[place] EMERGENCY DEPARTMENT AT [facility] Provider Note   CSN: [id number] Arrival date & time: 09/11/23  4098     History  Chief Complaint  Patient presents with   Facial Swelling    Jaime Nixon is a 27 y.o. male.  Patient is a 27 year old male with a history of seizures, tobacco use who is presenting today with worsening swelling of his face.  Patient reports for 5 days now he has had left upper dental pain but yesterday started having swelling in his face.  He states the pain has been so bad he has not been able to eat for the last 3 days.  When he was seen in the emergency room last night he was noted to have a leukocytosis of 27 but normal lactate and tachycardia.  He had imaging done that showed a 2.8 cm abscess along the left maxillary area.  In the ED last night patient had I&D per report of moderate pus.  However patient returns today for worsening swelling and pain.  He has not had a fever that he is aware of.  He denies any difficulty swallowing or breathing.  The history is provided by the patient and medical records.       Home Medications Prior to Admission medications   Medication Sig Start Date End Date Taking? Authorizing Provider  acetaminophen (TYLENOL) 500 MG tablet Take 4 tablets by mouth every 6 (six) hours as needed for moderate pain (pain score 4-6) or mild pain (pain score 1-3).    [provider]  clindamycin (CLEOCIN) 150 MG capsule Take 3 capsules (450 mg total) by mouth 3 (three) times daily for 7 days. 09/10/23 09/17/23  Coral Spikes, DO  ibuprofen (ADVIL) 200 MG tablet Take 800 mg by mouth every 6 (six) hours as needed for mild pain (pain score 1-3) or moderate pain (pain score 4-6).    [provider]  UNABLE TO FIND Take 4 tablets by mouth daily as needed. Med Name: [place] Pain Medication    [provider]  levETIRAcetam (KEPPRA) 500 MG tablet Take 1 tablet (500 mg total) by mouth 2 (two) times  daily. 12/13/18 02/07/20  Arthor Captain, PA-C      Allergies    Dexamethasone and Prednisone    Review of Systems   Review of Systems  Physical Exam Updated Vital Signs BP 122/76   Pulse (!) 121   Temp 97.9 F (36.6 C) (Oral)   Resp 16   SpO2 100%  Physical Exam Vitals and nursing note reviewed.  Constitutional:      General: He is not in acute distress.    Appearance: He is well-developed.  HENT:     Head: Normocephalic and atraumatic.      Left Ear: Tympanic membrane normal.     Mouth/Throat:     Comments: The left upper first bicuspid is broken off with gum swelling and induration Eyes:     Conjunctiva/sclera: Conjunctivae normal.     Pupils: Pupils are equal, round, and reactive to light.  Cardiovascular:     Rate and Rhythm: Regular rhythm. Tachycardia present.     Heart sounds: No murmur heard. Pulmonary:     Effort: Pulmonary effort is normal. No respiratory distress.     Breath sounds: Normal breath sounds. No wheezing or rales.  Musculoskeletal:        General: No tenderness. Normal range of motion.     Cervical back: Normal range of motion and  neck supple.  Lymphadenopathy:     Cervical: No cervical adenopathy.  Skin:    General: Skin is warm and dry.     Findings: No erythema or rash.  Neurological:     Mental Status: He is alert and oriented to person, place, and time.  Psychiatric:        Behavior: Behavior normal.     ED Results / Procedures / Treatments   Labs (all labs ordered are listed, but only abnormal results are displayed) Labs Reviewed - No data to display  EKG None  Radiology CT Soft Tissue Neck W Contrast  Result Date: 09/10/2023 CLINICAL DATA:  left sided facial swelling EXAM: CT NECK WITH CONTRAST TECHNIQUE: Multidetector CT imaging of the neck was performed using the standard protocol following the bolus administration of intravenous contrast. RADIATION DOSE REDUCTION: This exam was performed according to the departmental  dose-optimization program which includes automated exposure control, adjustment of the mA and/or kV according to patient size and/or use of iterative reconstruction technique. CONTRAST:  75mL OMNIPAQUE IOHEXOL 300 MG/ML  SOLN COMPARISON:  None Available. FINDINGS: Pharynx and larynx: Normal. No mass or swelling. Salivary glands: No inflammation, mass, or stone. Thyroid: Normal. Lymph nodes: Mildly prominent upper cervical chain lymph nodes, likely reactive given the above findings. Vascular: Not well evaluated due to contrast timing. Limited intracranial: Negative. Visualized orbits: Negative. Mastoids and visualized paranasal sinuses: Moderate to severe left maxillary sinus mucosal thickening. Remaining sinuses are mostly clear. Skeleton: No acute abnormality. Upper chest: Visualized lung apices are clear. Other: 2.8 x 0.7 x 2.0 cm peripherally enhancing fluid collection along the left maxilla, compatible with abscess. Surrounding edema and soft tissue thickening, compatible with phlegmon/abscess. IMPRESSION: 1. Approximately 2.8 cm abscess along the left maxilla with surrounding phlegmon/cellulitis. 2. The above is likely odontogenic given periapical lucency with overlying cortical dehiscence of the left first premolar. 3. Left maxillary sinus disease, probably also odontogenic. Electronically Signed   By: Feliberto Harts M.D.   On: 09/10/2023 22:15    Procedures Procedures    Medications Ordered in ED Medications  lactated ringers bolus 1,000 mL (has no administration in time range)  Ampicillin-Sulbactam (UNASYN) 3 g in sodium chloride 0.9 % 100 mL IVPB (has no administration in time range)    ED Course/ Medical Decision Making/ A&P                                 Medical Decision Making Risk Prescription drug management.   Pt with multiple medical problems and comorbidities and presenting today with a complaint that caries a high risk for morbidity and mortality.  Here today with worsening  symptoms of facial swelling and cellulitis.  This is most likely coming from a dental process.  However patient was here last night and given antibiotics and had I&D.  He returns today persistently tachycardic but improves with sitting down to the 90's.  No airway compromise at this time.  Given patient's worsening symptoms and prior history of similar issue requiring hospitalization for IV antibiotics spoke with Dr. Mia Creek with dentistry who recommended coming to the office later today and they would try to extract the tooth.  Pt given dose of unasyn here.  He is non-toxic appearing and did have leukocytosis last night but lactate was normal.  BP and temp today are normal.  Pt is agreeable to the plan and called the dentist while in the ED and will  be seen at the office this afternoon.         Final Clinical Impression(s) / ED Diagnoses Final diagnoses:  Dental abscess  Facial swelling    Rx / DC Orders ED Discharge Orders     None         Gwyneth Sprout, MD 09/11/23 [phone]

## 2023-09-11 NOTE — ED Triage Notes (Signed)
Pt reports left sided facial swelling. Pt reports he was seen last night and had an abscess drained. Pt reports since that time his face has gotten more swollen. Denies fevers.

## 2023-09-11 NOTE — ED Notes (Signed)
 Pt d/c home per EDP order. Discharge summary reviewed, verbalizes understanding. Ambulatory off unit. NAD.

## 2023-11-22 ENCOUNTER — Ambulatory Visit (HOSPITAL_COMMUNITY)
Admission: RE | Admit: 2023-11-22 | Discharge: 2023-11-22 | Disposition: A | Discharge status: Home / Self Care | Payer: Self-pay | Source: Ambulatory Visit | Attending: Family Medicine | Admitting: Family Medicine

## 2023-11-22 ENCOUNTER — Encounter (HOSPITAL_COMMUNITY): Payer: Self-pay

## 2023-11-22 VITALS — BP 123/78 | HR 74 | Temp 97.8°F | Resp 15

## 2023-11-22 DIAGNOSIS — K047 Periapical abscess without sinus: Secondary | ICD-10-CM

## 2023-11-22 MED ORDER — KETOROLAC TROMETHAMINE 30 MG/ML IJ SOLN
INTRAMUSCULAR | Status: AC
Start: 2023-11-22 — End: ?
  Filled 2023-11-22: qty 1

## 2023-11-22 MED ORDER — KETOROLAC TROMETHAMINE 30 MG/ML IJ SOLN
30.0000 mg | Freq: Once | INTRAMUSCULAR | Status: AC
  Administered 2023-11-22: 30 mg via INTRAMUSCULAR

## 2023-11-22 MED ORDER — AMOXICILLIN-POT CLAVULANATE 875-125 MG PO TABS
1.0000 | ORAL_TABLET | Freq: Two times a day (BID) | ORAL | 0 refills | Status: AC
Start: 2023-11-22 — End: 2023-11-29

## 2023-11-22 MED ORDER — KETOROLAC TROMETHAMINE 10 MG PO TABS: 10.0000 mg | ORAL_TABLET | Freq: Four times a day (QID) | ORAL | 0 refills | Status: DC | PRN

## 2023-11-22 NOTE — ED Provider Notes (Signed)
[facility]    CSN: [id number] Arrival date & time: 11/22/23  1556      History   Chief Complaint Chief Complaint  Patient presents with   Dental Problem    I think it's a tooth infection. - Entered by patient    HPI Jaime Nixon is a 28 y.o. male.   HPI Here for pain in his right lower jaw and tooth.  He began bothering him 2 weeks ago and in the last 2 days it is worsened.  Tylenol and ibuprofen were helping but now they are not.  No fever or chills  He is allergic to dexamethasone and prednisone.   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 No pertinent surgical history.     Home Medications    Prior to Admission medications   Medication Sig Start Date End Date Taking?  Authorizing Provider  amoxicillin-clavulanate (AUGMENTIN) 875-125 MG tablet Take 1 tablet by mouth 2 (two) times daily for 7 days. 11/22/23 11/29/23 Yes Zenia Resides, MD  ketorolac (TORADOL) 10 MG tablet Take 1 tablet (10 mg total) by mouth every 6 (six) hours as needed (pain). 11/22/23  Yes Zenia Resides, MD  acetaminophen (TYLENOL) 500 MG tablet Take 4 tablets by mouth every 6 (six) hours as needed for moderate pain (pain score 4-6) or mild pain (pain score 1-3).    [provider]  UNABLE TO FIND Take 4 tablets by mouth daily as needed. Med Name: [place] Pain Medication    [provider]  levETIRAcetam (KEPPRA) 500 MG tablet Take 1 tablet (500 mg total) by mouth 2 (two) times daily. 12/13/18 02/07/20  Arthor Captain, PA-C    Family History Family History  Problem Relation Age of Onset   Seizures Father    Seizures Brother     Social History Social History   Tobacco Use   Smoking status: Every Day    Current packs/day: 1.00    Average packs/day: 1 pack/day for 10.0 years (10.0 ttl pk-yrs)    Types: Cigarettes   Smokeless tobacco: Never  Vaping Use   Vaping status: Never Used  Substance Use Topics   Alcohol use: Not Currently   Drug use: Not Currently    Types: Marijuana     Allergies  Dexamethasone and Prednisone   Review of Systems Review of Systems   Physical Exam Triage Vital Signs ED Triage Vitals  Encounter Vitals Group     BP 11/22/23 1628 123/78     Systolic BP Percentile --      Diastolic BP Percentile --      Pulse Rate 11/22/23 1628 74     Resp 11/22/23 1628 15     Temp 11/22/23 1628 97.8 F (36.6 C)     Temp Source 11/22/23 1628 Oral     SpO2 11/22/23 1628 99 %     Weight --      Height --      Head Circumference --      Peak Flow --      Pain Score 11/22/23 1627 10     Pain Loc --      Pain Education --      Exclude from Growth Chart --    No data found.  Updated Vital Signs BP 123/78 (BP Location: Left Arm)    Pulse 74   Temp 97.8 F (36.6 C) (Oral)   Resp 15   SpO2 99%   Visual Acuity Right Eye Distance:   Left Eye Distance:   Bilateral Distance:    Right Eye Near:   Left Eye Near:    Bilateral Near:     Physical Exam Vitals reviewed.  Constitutional:      General: He is not in acute distress.    Appearance: He is not ill-appearing, toxic-appearing or diaphoretic.  HENT:     Mouth/Throat:     Mouth: Mucous membranes are moist.     Comments: There is swelling around the posterior right lower dental ridge behind the posterior tooth. Cardiovascular:     Rate and Rhythm: Normal rate and regular rhythm.     Heart sounds: No murmur heard. Pulmonary:     Effort: Pulmonary effort is normal.     Breath sounds: Normal breath sounds.  Skin:    Coloration: Skin is not jaundiced or pale.  Neurological:     General: No focal deficit present.     Mental Status: He is alert and oriented to person, place, and time.  Psychiatric:        Behavior: Behavior normal.      UC Treatments / Results  Labs (all labs ordered are listed, but only abnormal results are displayed) Labs Reviewed - No data to display  EKG   Radiology No results found.  Procedures Procedures (including critical care time)  Medications Ordered in UC Medications  ketorolac (TORADOL) 30 MG/ML injection 30 mg (has no administration in time range)    Initial Impression / Assessment and Plan / UC Course  I have reviewed the triage vital signs and the nursing notes.  Pertinent labs & imaging results that were available during my care of the patient were reviewed by me and considered in my medical decision making (see chart for details).     Toradol injection is given here and Toradol tablets are sent to the pharmacy for pain.  Augmentin is sent in  He is actually established with a dentist and had some teeth pulled maybe a month or 2 ago.  Will follow-up with that person in the next week or 2 Final Clinical  Impressions(s) / UC Diagnoses   Final diagnoses:  Dental infection   Discharge Instructions   None    ED Prescriptions     Medication Sig Dispense Auth. Provider  ketorolac (TORADOL) 10 MG tablet Take 1 tablet (10 mg total) by mouth every 6 (six) hours as needed (pain). 20 tablet Zenia Resides, MD   amoxicillin-clavulanate (AUGMENTIN) 875-125 MG tablet Take 1 tablet by mouth 2 (two) times daily for 7 days. 14 tablet Lenee Franze, Janace Aris, MD      PDMP not reviewed this encounter.   Zenia Resides, MD 11/22/23 [phone]

## 2023-11-22 NOTE — ED Triage Notes (Signed)
Pt reports right sided dental pain for 2 weeks. Taken tylneol and ibuprofen with no relief.

## 2023-11-22 NOTE — Discharge Instructions (Signed)
You have been given a shot of Toradol 30 mg today.  Ketorolac 10 mg tablets--take 1 tablet every 6 hours as needed for pain.  This is the same medicine that is in the shot we just gave you  Take amoxicillin-clavulanate 875 mg--1 tab twice daily with food for 7 days

## 2023-12-07 ENCOUNTER — Ambulatory Visit (HOSPITAL_COMMUNITY): Payer: Self-pay

## 2023-12-09 ENCOUNTER — Ambulatory Visit (HOSPITAL_COMMUNITY)
Admission: RE | Admit: 2023-12-09 | Discharge: 2023-12-09 | Disposition: A | Discharge status: Home / Self Care | Payer: Self-pay | Source: Ambulatory Visit

## 2023-12-09 ENCOUNTER — Encounter (HOSPITAL_COMMUNITY): Payer: Self-pay

## 2023-12-09 VITALS — BP 120/76 | HR 81 | Temp 97.7°F | Resp 18

## 2023-12-09 DIAGNOSIS — J0100 Acute maxillary sinusitis, unspecified: Secondary | ICD-10-CM

## 2023-12-09 MED ORDER — DOXYCYCLINE HYCLATE 100 MG PO CAPS: 100.0000 mg | ORAL_CAPSULE | Freq: Two times a day (BID) | ORAL | 0 refills | Status: DC

## 2023-12-09 NOTE — Discharge Instructions (Addendum)
Start taking doxycycline twice daily for 10 days. I recommend taking Mucinex for congestion as well to help break up congestion. You can continue using Flonase nasal once daily and you can take a daily allergy medication like Zyrtec or Claritin. Return here as need.

## 2023-12-09 NOTE — ED Triage Notes (Signed)
Pt c/o of ear fullness, difficulty hearing, occasional pain, and ringing bilaterally in his ears.   Start Date: Last week   Home Interventions: Nasal Spray

## 2023-12-09 NOTE — ED Provider Notes (Signed)
[facility]    CSN: [id number] Arrival date & time: 12/09/23  1837      History   Chief Complaint Chief Complaint  Patient presents with   Ear Fullness    HPI Jaime Nixon is a 28 y.o. male.   Patient presents with ear fullness, muffled hearing, congestion, sinus pressure and headache that began 8 days ago.  Patient reports using Flonase nasal spray with minimal relief.  Denies fever, cough, blurred vision, nausea, and vomiting.   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 No pertinent surgical history.     Home Medications    Prior to Admission medications   Medication Sig Start Date End Date Taking? Authorizing Provider  doxycycline (VIBRAMYCIN) 100 MG capsule  Take 1 capsule (100 mg total) by mouth 2 (two) times daily. 12/09/23  Yes Susann Givens, Clarkson Rosselli A, NP  ibuprofen (ADVIL) 800 MG tablet Take 800 mg by mouth every 6 (six) hours as needed. 10/03/23  Yes [provider]  acetaminophen (TYLENOL) 500 MG tablet Take 4 tablets by mouth every 6 (six) hours as needed for moderate pain (pain score 4-6) or mild pain (pain score 1-3).    [provider]  levETIRAcetam (KEPPRA) 500 MG tablet Take 1 tablet (500 mg total) by mouth 2 (two) times daily. 12/13/18 02/07/20  Arthor Captain, PA-C    Family History Family History  Problem Relation Age of Onset   Seizures Father    Seizures Brother     Social History Social History   Tobacco Use   Smoking status: Every Day    Current packs/day: 1.00    Average packs/day: 1 pack/day for 10.0 years (10.0 ttl pk-yrs)    Types: Cigarettes   Smokeless tobacco: Never  Vaping Use   Vaping status: Never Used  Substance Use Topics   Alcohol use: Not Currently   Drug use: Not Currently    Types: Marijuana     Allergies   Dexamethasone and Prednisone   Review of Systems Review of Systems  Per HPI  Physical Exam Triage Vital Signs ED Triage Vitals  Encounter Vitals Group     BP 12/09/23 1906 120/76     Systolic BP Percentile --      Diastolic BP Percentile --  Pulse Rate 12/09/23 1906 81     Resp 12/09/23 1906 18     Temp 12/09/23 1906 97.7 F (36.5 C)     Temp Source 12/09/23 1906 Oral     SpO2 12/09/23 1906 97 %     Weight --      Height --      Head Circumference --      Peak Flow --      Pain Score 12/09/23 1905 0     Pain Loc --      Pain Education --      Exclude from Growth Chart --    No data found.  Updated Vital Signs BP 120/76 (BP Location: Right Arm)   Pulse 81   Temp 97.7 F (36.5 C) (Oral)   Resp 18   SpO2 97%   Visual Acuity Right Eye Distance:   Left Eye Distance:   Bilateral Distance:    Right Eye Near:   Left Eye Near:    Bilateral Near:      Physical Exam Vitals and nursing note reviewed.  Constitutional:      General: He is awake. He is not in acute distress.    Appearance: Normal appearance. He is well-developed and well-groomed. He is not ill-appearing.  HENT:     Right Ear: Tympanic membrane, ear canal and external ear normal.     Left Ear: Tympanic membrane, ear canal and external ear normal.     Nose: Congestion and rhinorrhea present.     Right Sinus: Maxillary sinus tenderness present.     Left Sinus: Maxillary sinus tenderness present.     Mouth/Throat:     Mouth: Mucous membranes are moist.     Pharynx: Posterior oropharyngeal erythema present. No oropharyngeal exudate.  Musculoskeletal:     Cervical back: Normal range of motion and neck supple.  Skin:    General: Skin is warm and dry.  Neurological:     Mental Status: He is alert.  Psychiatric:        Behavior: Behavior is cooperative.      UC Treatments / Results  Labs (all labs ordered are listed, but only abnormal results are displayed) Labs Reviewed - No data to display  EKG   Radiology No results found.  Procedures Procedures (including critical care time)  Medications Ordered in UC Medications - No data to display  Initial Impression / Assessment and Plan / UC Course  I have reviewed the triage vital signs and the nursing notes.  Pertinent labs & imaging results that were available during my care of the patient were reviewed by me and considered in my medical decision making (see chart for details).     Patient presented with 8-day history of ear fullness, muffled hearing, congestion, sinus pressure, and headache.  Denies any other symptoms.  Upon assessment congestion and rhinorrhea are present, mild erythema noted to pharynx.  Bilateral maxillary sinus tenderness noted.  Patient reports taking Augmentin in the past for sinus infection with minimal relief.  Prescribe doxycycline for sinusitis.  Discussed over-the-counter  medication for symptoms discussed return precautions. Final Clinical Impressions(s) / UC Diagnoses   Final diagnoses:  Acute non-recurrent maxillary sinusitis     Discharge Instructions      Start taking doxycycline twice daily for 10 days. I recommend taking Mucinex for congestion as well to help break up congestion. You can continue using Flonase nasal once daily and you can take a daily allergy medication like Zyrtec or Claritin. Return  here as need.     ED Prescriptions     Medication Sig Dispense Auth. Provider   doxycycline (VIBRAMYCIN) 100 MG capsule Take 1 capsule (100 mg total) by mouth 2 (two) times daily. 20 capsule Wynonia Lawman A, NP      PDMP not reviewed this encounter.   Wynonia Lawman A, NP 12/09/23 1925

## 2023-12-23 ENCOUNTER — Ambulatory Visit
Admission: EM | Admit: 2023-12-23 | Discharge: 2023-12-23 | Disposition: A | Discharge status: Home / Self Care | Payer: Self-pay | Attending: Family Medicine | Admitting: Family Medicine

## 2023-12-23 DIAGNOSIS — K047 Periapical abscess without sinus: Secondary | ICD-10-CM

## 2023-12-23 MED ORDER — NAPROXEN 500 MG PO TABS: 500.0000 mg | ORAL_TABLET | Freq: Two times a day (BID) | ORAL | 0 refills | Status: DC

## 2023-12-23 MED ORDER — AMOXICILLIN-POT CLAVULANATE 875-125 MG PO TABS: 1.0000 | ORAL_TABLET | Freq: Two times a day (BID) | ORAL | 0 refills | Status: DC

## 2023-12-23 MED ORDER — KETOROLAC TROMETHAMINE 60 MG/2ML IM SOLN
60.0000 mg | Freq: Once | INTRAMUSCULAR | Status: AC
  Administered 2023-12-23: 60 mg via INTRAMUSCULAR

## 2023-12-23 NOTE — ED Provider Notes (Signed)
 [facility]  Note:  This document was prepared using Conservation officer, historic buildings and may include unintentional dictation errors.  MRN: [id number] DOB: [birth year]  Subjective:   Jaime Nixon is a 28 y.o. male presenting for 1 week history of recurrent right-sided dental pain, oral pain, facial pain.  Has worked with a Sports coach to try and have these removed but is required to take antibiotics first.  Has previously had work done, teeth extracted on the left side.  Would like something for pain in clinic.  No current facility-administered medications for this encounter.  Current Outpatient Medications:    acetaminophen (TYLENOL) 500 MG tablet, Take 4 tablets by mouth every 6 (six) hours as needed for moderate pain (pain score 4-6) or mild pain (pain score 1-3)., Disp: , Rfl:    doxycycline (VIBRAMYCIN) 100 MG capsule, Take 1 capsule (100 mg total) by mouth 2 (two) times daily., Disp: 20 capsule, Rfl: 0   ibuprofen (ADVIL) 800 MG tablet, Take 800 mg by mouth every 6 (six) hours as needed., Disp: , Rfl:    Allergies  Allergen Reactions   Dexamethasone Shortness Of Breath and Other (See Comments)    Makes pt angry    Prednisone Other (See Comments)    Makes pt angry     Past Medical History:  Diagnosis Date   Current smoker    History of substance use    Seizures (HCC)      History reviewed. No pertinent surgical history.  Family History  Problem Relation Age of Onset   Seizures Father    Seizures Brother     Social History   Tobacco Use   Smoking status: Every Day    Current packs/day: 1.00    Average packs/day: 1 pack/day for 10.0 years (10.0 ttl pk-yrs)    Types: Cigarettes   Smokeless tobacco: Never  Vaping Use   Vaping status: Never Used  Substance Use Topics   Alcohol use: Not Currently   Drug use: Not Currently    Types: Marijuana    ROS   Objective:   Vitals: BP 126/80 (BP Location: Left Arm)   Pulse 78    Temp 97.6 F (36.4 C) (Oral)   Resp 20   SpO2 98%   Physical Exam Constitutional:      General: He is not in acute distress.    Appearance: Normal appearance. He is well-developed and normal weight. He is not ill-appearing, toxic-appearing or diaphoretic.  HENT:     Head: Normocephalic and atraumatic.     Right Ear: External ear normal.     Left Ear: External ear normal.     Nose: Nose normal.     Mouth/Throat:     Pharynx: Oropharynx is clear.   Eyes:     General: No scleral icterus.       Right eye: No discharge.        Left eye: No discharge.     Extraocular Movements: Extraocular movements intact.  Cardiovascular:     Rate and Rhythm: Normal rate.  Pulmonary:     Effort: Pulmonary effort is normal.  Musculoskeletal:     Cervical back: Normal range of motion.  Neurological:     Mental Status: He is alert and oriented to person, place, and time.  Psychiatric:        Mood and Affect: Mood normal.        Behavior: Behavior normal.        Thought Content:  Thought content normal.        Judgment: Judgment normal.     IM Toradol 60 mg administered in clinic.  Assessment and Plan :   PDMP not reviewed this encounter.  1. Dental infection    Start Augmentin for dental infection/abscess, use naproxen for pain and inflammation. Emphasized need for dental surgeon consult. Counseled patient on potential for adverse effects with medications prescribed/recommended today, strict ER and return-to-clinic precautions discussed, patient verbalized understanding.    Wallis Bamberg, [place] 12/23/23 1901

## 2023-12-23 NOTE — ED Triage Notes (Signed)
 Pt c/o right side dental pain "my whole jaw all the way around" x 2 months-taking advil for pain-NAD-steady gait

## 2024-10-13 NOTE — Progress Notes (Signed)
 "     Assessment:   1. Generalized abdominal pain  POC Urinalysis Auto without Microscopic    2. Bilious vomiting with nausea      3. Right-sided low back pain without sciatica, unspecified chronicity      4. Constipation, unspecified constipation type      5. Proteinuria, unspecified type      6. Ketonuria          Plan:   ED evaluation for more involved workup of the patient's pain and GI symptoms.  1. New medications from today's visit:  No orders of the defined types were placed in this encounter.    Encounter Medications[1]  MDM Jaime Nixon is a 28 y.o. male who presents with c/o 2 to 3-week history of ongoing nausea, bilious vomiting, low back pain, constipation with inability to have bowel movements for 2 full weeks.  Patient reports a history of similar issue approximately 1 year ago where he received a full workup without a diagnosis.  Upon presentation, the patient's vital signs are stable and he is afebrile.  He does not appear ill or toxic.  He is noted to have distended abdomen with right upper quadrant tenderness but no rebound or guarding.  No hepatosplenomegaly.  Urine collection for UA revealed some proteinuria and ketonuria.  Urobilinogen present.  Discussed findings and plan of care at length with the patient.  He remained stable during the entire course of stay and is able to ambulate without difficulty.  Patient prefers [facility] for further evaluation and directions given.  He does request work note which he will be provided after ED evaluation.      Jaime Nixon   Subjective:   Previsit planning was completed via snapshot and review of chart.    Jaime Nixon is a 28 y.o. male who presents with c/o 2-1/2 to 3-week history of bilious vomiting, ongoing nausea, and a 2-week history of inability to have a bowel movement with low back pain for the same amount of time.  He denies abdominal pain, but does state he has diffuse upper abdominal  tenderness.  His appetite remains intact, but he admits to a 12 pound weight loss over the past 2 to 3 weeks because of vomiting decrease in food intake.  Patient has had no fever, chills, chest pain, shortness of breath. No dizziness or body aches.  Nausea has been controlled somewhat with Zofran  which he was given by a friend.  He has taken nothing for his constipation symptoms.  Patient reports symptoms started out with him feeling significantly anxious with no significant issues to cause him to feel anxious.  He denies a history of anxiety diagnosis and has no suicidal, Seidel thoughts or ideations.  Patient has no PCP.  Past history significant for similar issues approximately 1 year ago which he states he received huge workup with no diagnosis.  Medical records unavailable.  Social history significant for tobacco use and occasional cannabis use.  Patient denies any use of cannabis since symptoms started 3 weeks ago.  ROS: Positives noted in HPI   Medical History[2]  Allergies[3]       Objective:   BP 123/85 (BP Location: Left arm, Patient Position: Sitting)   Pulse 81   Temp 98.8 F (37.1 C) (Oral)   Resp 19   Ht 1.829 m (6')   Wt 97.5 kg (215 lb)   SpO2 100%   BMI 29.16 kg/m  Physical Exam Vitals and nursing note reviewed.  Constitutional:  General: He is not in acute distress.    Appearance: Normal appearance. He is not ill-appearing or toxic-appearing.  HENT:     Head: Normocephalic and atraumatic.     Right Ear: External ear normal.     Left Ear: External ear normal.     Nose: Nose normal. No congestion.     Mouth/Throat:     Mouth: Mucous membranes are dry.     Pharynx: Oropharynx is clear. Posterior oropharyngeal erythema present.     Comments: Coating of the tongue noted Eyes:     General: No scleral icterus.    Conjunctiva/sclera: Conjunctivae normal.     Pupils: Pupils are equal, round, and reactive to light.  Cardiovascular:     Rate and Rhythm: Normal  rate and regular rhythm.     Pulses: Normal pulses.     Heart sounds: Normal heart sounds. No murmur heard. Pulmonary:     Effort: Pulmonary effort is normal. No respiratory distress.     Breath sounds: Normal breath sounds.  Abdominal:     General: Bowel sounds are normal. There is distension.     Palpations: Abdomen is soft. There is no mass.     Tenderness: There is abdominal tenderness. There is no right CVA tenderness, left CVA tenderness, guarding or rebound.     Comments: Tenderness of the epigastrium and right upper quadrant.  Heeltap, psoas, obturator signs are negative  Musculoskeletal:        General: Tenderness present. No deformity.     Cervical back: Normal range of motion and neck supple.  Skin:    General: Skin is warm and dry.     Capillary Refill: Capillary refill takes less than 2 seconds.     Findings: No bruising, erythema or rash.     Comments: Turgor okay  Neurological:     General: No focal deficit present.     Mental Status: He is alert and oriented to person, place, and time.     Sensory: No sensory deficit.  Psychiatric:        Mood and Affect: Mood normal.        Behavior: Behavior normal.      Laboratory Testing/Imaging Recent Results (from the past 24 hours)  POC Urinalysis Auto without Microscopic   Collection Time: 10/13/24  7:48 PM  Result Value Ref Range   Color, Urine Dark Yellow (A) Yellow   Clarity, Urine Slightly-Cloudy (A) Clear   Glucose, Urine Negative Negative mg/dL   Bilirubin, Urine Negative Negative   Ketones, Urine 40 (A) Negative mg/dL   Specific Gravity, Urine 1.020 1.010, 1.015, 1.020, 1.025   Blood, Urine Negative Negative   pH, Urine 7.5 5.0, 5.5, 6.0, 6.5, 7.0, 7.5, 8.0   Protein, Urine 100 (A) Negative mg/dL   Urobilinogen, Urine 4.0 (A) <2.0 mg/dL   Nitrite, Urine Negative Negative   Leukocyte Esterase, Urine Negative Negative   Kit/Device Lot # [id number]    Kit/Device Expiration Date 04/26/2025       Imaging result:   If imaging obtained in clinic, radiology overread pending.       [1] No outpatient encounter medications on file as of 10/13/2024.   No facility-administered encounter medications on file as of 10/13/2024.  [2] No past medical history on file. [3] Allergies Allergen Reactions   Prednisone     Cardiac arrest   "

## 2024-10-14 ENCOUNTER — Emergency Department (HOSPITAL_BASED_OUTPATIENT_CLINIC_OR_DEPARTMENT_OTHER): Payer: Self-pay

## 2024-10-14 ENCOUNTER — Emergency Department (HOSPITAL_BASED_OUTPATIENT_CLINIC_OR_DEPARTMENT_OTHER)
Admission: EM | Admit: 2024-10-14 | Discharge: 2024-10-14 | Discharge status: Against Medical Advice | Payer: Self-pay | Attending: Emergency Medicine | Admitting: Emergency Medicine

## 2024-10-14 ENCOUNTER — Other Ambulatory Visit: Payer: Self-pay

## 2024-10-14 ENCOUNTER — Encounter (HOSPITAL_BASED_OUTPATIENT_CLINIC_OR_DEPARTMENT_OTHER): Payer: Self-pay

## 2024-10-14 DIAGNOSIS — R112 Nausea with vomiting, unspecified: Secondary | ICD-10-CM | POA: Insufficient documentation

## 2024-10-14 DIAGNOSIS — Z5329 Procedure and treatment not carried out because of patient's decision for other reasons: Secondary | ICD-10-CM | POA: Insufficient documentation

## 2024-10-14 DIAGNOSIS — K5900 Constipation, unspecified: Secondary | ICD-10-CM | POA: Insufficient documentation

## 2024-10-14 LAB — COMPREHENSIVE METABOLIC PANEL WITH GFR
ALT: 9 U/L (ref 0–44)
AST: 27 U/L (ref 15–41)
Albumin: 5 g/dL (ref 3.5–5.0)
Alkaline Phosphatase: 92 U/L (ref 38–126)
Anion gap: 16 — ABNORMAL HIGH (ref 5–15)
BUN: 15 mg/dL (ref 6–20)
CO2: 21 mmol/L — ABNORMAL LOW (ref 22–32)
Calcium: 10.3 mg/dL (ref 8.9–10.3)
Chloride: 101 mmol/L (ref 98–111)
Creatinine, Ser: 0.79 mg/dL (ref 0.61–1.24)
GFR, Estimated: >60 mL/min (ref >60)
Glucose, Bld: 125 mg/dL — ABNORMAL HIGH (ref 70–99)
Potassium: 3.6 mmol/L (ref 3.5–5.1)
Sodium: 137 mmol/L (ref 135–145)
Total Bilirubin: 0.4 mg/dL (ref 0.0–1.2)
Total Protein: 8.1 g/dL (ref 6.5–8.1)

## 2024-10-14 LAB — URINALYSIS, ROUTINE W REFLEX MICROSCOPIC
Bilirubin Urine: NEGATIVE
Glucose, UA: NEGATIVE mg/dL
Hgb urine dipstick: NEGATIVE
Ketones, ur: 15 mg/dL — AB
Leukocytes,Ua: NEGATIVE
Nitrite: NEGATIVE
Specific Gravity, Urine: 1.028 (ref 1.005–1.030)
pH: 6 (ref 5.0–8.0)

## 2024-10-14 LAB — CBC
HCT: 45.6 % (ref 39.0–52.0)
Hemoglobin: 15.8 g/dL (ref 13.0–17.0)
MCH: 30.2 pg (ref 26.0–34.0)
MCHC: 34.6 g/dL (ref 30.0–36.0)
MCV: 87 fL (ref 80.0–100.0)
Platelets: 218 K/uL (ref 150–400)
RBC: 5.24 MIL/uL (ref 4.22–5.81)
RDW: 13.2 % (ref 11.5–15.5)
WBC: 9.1 K/uL (ref 4.0–10.5)
nRBC: 0 % (ref 0.0–0.2)

## 2024-10-14 LAB — LIPASE, BLOOD: Lipase: 25 U/L (ref 11–51)

## 2024-10-14 MED ORDER — IOHEXOL 300 MG/ML  SOLN
100.0000 mL | Freq: Once | INTRAMUSCULAR | Status: AC | PRN
  Administered 2024-10-14: 18:00:00 100 mL via INTRAVENOUS

## 2024-10-14 MED ORDER — DROPERIDOL 2.5 MG/ML IJ SOLN
2.5000 mg | Freq: Once | INTRAMUSCULAR | Status: AC
  Administered 2024-10-14: 18:00:00 2.5 mg via INTRAVENOUS
  Filled 2024-10-14: qty 2

## 2024-10-14 MED ORDER — SODIUM CHLORIDE 0.9 % IV BOLUS
1000.0000 mL | Freq: Once | INTRAVENOUS | Status: AC
  Administered 2024-10-14: 18:00:00 1000 mL via INTRAVENOUS

## 2024-10-14 NOTE — ED Notes (Signed)
 Patient called out on bell. In to check on patient, he was out of bed and had pulled out his IV. He was finishing getting dressed. He reports having a family emergency, something happened to his son, and he needed to leave immediately. He ran out of room and out of the ED. Dr Pamella made aware. Cath tip was intact to IV catheter in the floor. Unable to visualize site due to patient leaving in haste. No blood noted on his arm that I can recall.

## 2024-10-14 NOTE — ED Provider Notes (Signed)
 [facility] Provider Note   CSN: [id number] Arrival date & time: 10/14/24  1628     Patient presents with: Abdominal Pain, Nausea, and Emesis   Jaime Nixon is a 28 y.o. male.  With a history of cannabinoid hyperemesis syndrome and seizure disorder presents to the ED for abdominal pain.  Patient reports 3 weeks of ongoing abdominal pain nausea vomiting constipation.  Last bowel movement was 3 weeks ago.  Has had similar episodes of abdominal pain and constipation in the past with no clear findings to explain his symptoms.  Has seen a gastroenterologist for these episodes in the past.  Last marijuana use reportedly 2 weeks ago.  No fevers or chills.  Has not tried over-the-counter laxatives at home    Abdominal Pain Associated symptoms: vomiting   Emesis Associated symptoms: abdominal pain        Prior to Admission medications  Medication Sig Start Date End Date Taking? Authorizing Provider  acetaminophen  (TYLENOL ) 500 MG tablet Take 4 tablets by mouth every 6 (six) hours as needed for moderate pain (pain score 4-6) or mild pain (pain score 1-3).    [provider]  amoxicillin -clavulanate (AUGMENTIN ) 875-125 MG tablet Take 1 tablet by mouth 2 (two) times daily. 12/23/23   Christopher Savannah, PA-C  ibuprofen  (ADVIL ) 800 MG tablet Take 800 mg by mouth every 6 (six) hours as needed. 10/03/23   [provider]  naproxen  (NAPROSYN ) 500 MG tablet Take 1 tablet (500 mg total) by mouth 2 (two) times daily with a meal. 12/23/23   Christopher Savannah, PA-C  levETIRAcetam  (KEPPRA ) 500 MG tablet Take 1 tablet (500 mg total) by mouth 2 (two) times daily. 12/13/18 02/07/20  Harris, Abigail, PA-C    Allergies: Dexamethasone  and Prednisone    Review of Systems  Gastrointestinal:  Positive for abdominal pain and vomiting.    Updated Vital Signs BP 98/62   Pulse 72   Temp 98.3 F (36.8 C)   Resp 16   SpO2 99%   Physical Exam Vitals and  nursing note reviewed.  HENT:     Head: Normocephalic and atraumatic.  Eyes:     Pupils: Pupils are equal, round, and reactive to light.  Cardiovascular:     Rate and Rhythm: Normal rate and regular rhythm.  Pulmonary:     Effort: Pulmonary effort is normal.     Breath sounds: Normal breath sounds.  Abdominal:     Palpations: Abdomen is soft.     Tenderness: There is abdominal tenderness in the epigastric area. There is no guarding or rebound.  Skin:    General: Skin is warm and dry.  Neurological:     Mental Status: He is alert.  Psychiatric:        Mood and Affect: Mood normal.     (all labs ordered are listed, but only abnormal results are displayed) Labs Reviewed  COMPREHENSIVE METABOLIC PANEL WITH GFR - Abnormal; Notable for the following components:      Result Value   CO2 21 (*)    Glucose, Bld 125 (*)    Anion gap 16 (*)    All other components within normal limits  URINALYSIS, ROUTINE W REFLEX MICROSCOPIC - Abnormal; Notable for the following components:   Ketones, ur 15 (*)    Protein, ur TRACE (*)    All other components within normal limits  LIPASE, BLOOD  CBC    EKG: None  Radiology: CT ABDOMEN PELVIS W CONTRAST Result  Date: 10/14/2024 EXAM: CT ABDOMEN AND PELVIS WITH CONTRAST 10/14/2024 05:53:45 PM TECHNIQUE: CT of the abdomen and pelvis was performed with the administration of 100 mL of iohexol  (OMNIPAQUE ) 300 MG/ML solution. Multiplanar reformatted images are provided for review. Automated exposure control, iterative reconstruction, and/or weight-based adjustment of the mA/kV was utilized to reduce the radiation dose to as low as reasonably achievable. COMPARISON: None available. CLINICAL HISTORY: Bowel obstruction suspected; abd pain, constipation, last BM 3 wks ago. FINDINGS: LOWER CHEST: No acute abnormality. LIVER: The liver is unremarkable. GALLBLADDER AND BILE DUCTS: Gallbladder is unremarkable. No biliary ductal dilatation. SPLEEN: No acute  abnormality. PANCREAS: No acute abnormality. ADRENAL GLANDS: No acute abnormality. KIDNEYS, URETERS AND BLADDER: No stones in the kidneys or ureters. No hydronephrosis. No perinephric or periureteral stranding. Urinary bladder is unremarkable. GI AND BOWEL: Appendix normal. The stomach, small bowel, and large bowel are otherwise unremarkable. There is no bowel obstruction. PERITONEUM AND RETROPERITONEUM: No ascites. No free air. VASCULATURE: Aorta is normal in caliber. LYMPH NODES: No lymphadenopathy. REPRODUCTIVE ORGANS: No acute abnormality. BONES AND SOFT TISSUES: No acute osseous abnormality. No focal soft tissue abnormality. IMPRESSION: 1. No acute findings. No evidence of bowel obstruction. Electronically signed by: Dorethia Molt MD 10/14/2024 06:31 PM EST RP Workstation: [id number]     Procedures   Medications Ordered in the ED  droperidol  (INAPSINE ) 2.5 MG/ML injection 2.5 mg (2.5 mg Intravenous Given 10/14/24 1732)  sodium chloride  0.9 % bolus 1,000 mL (0 mLs Intravenous Stopped 10/14/24 1829)  iohexol  (OMNIPAQUE ) 300 MG/ML solution 100 mL (100 mLs Intravenous Contrast Given 10/14/24 1749)    Clinical Course as of 10/14/24 1849  Thu Oct 14, 2024  1849 Laboratory workup unremarkable overall CT abdomen pelvis shows no acute findings no evidence of obstruction.  Nursing staff is informing that patient has left prior to formal discharge as there is currently a family emergency [MP]    Clinical Course User Index [MP] Pamella Ozell LABOR, DO                                 Medical Decision Making 28 year old male with history as above presented to the ED for abdominal pain nausea vomiting constipation x 3 weeks.  Reports that his last bowel movement was 3 weeks ago.  Still able to pass gas.  Afebrile well-appearing on my exam with some epigastric tenderness.    Differential diagnosis includes: Cannabinoid hyperemesis syndrome Bowel obstruction Constipation Acute intra-abdominal  infectious/inflammatory process such as appendicitis, diverticulitis, pancreatitis and cholecystitis   Will obtain laboratory workup including CBC with differential, metabolic panel, lipase and urinalysis along with CT abdomen pelvis Will provide IV fluids for rehydration and droperidol  for relief of abdominal pain nausea considering history of cannabinoid hyperemesis syndrome  Amount and/or Complexity of Data Reviewed Labs: ordered. Radiology: ordered.  Risk Prescription drug management.        Final diagnoses:  Constipation, unspecified constipation type    ED Discharge Orders     None          Pamella Ozell LABOR, DO 10/14/24 1849

## 2024-10-14 NOTE — ED Triage Notes (Signed)
 Pt c/o NV, constipation x3wks. Advises he is still able to pass gas, no fevers but I feel hot & cold all the time. States he was seen at [facility] for same, advised to come to ED for further eval.

## 2024-10-15 ENCOUNTER — Encounter (HOSPITAL_COMMUNITY): Payer: Self-pay

## 2024-10-15 ENCOUNTER — Other Ambulatory Visit: Payer: Self-pay

## 2024-10-15 ENCOUNTER — Emergency Department (HOSPITAL_COMMUNITY)
Admission: EM | Admit: 2024-10-15 | Discharge: 2024-10-15 | Disposition: A | Discharge status: Home / Self Care | Payer: Self-pay | Attending: Emergency Medicine | Admitting: Emergency Medicine

## 2024-10-15 ENCOUNTER — Emergency Department (HOSPITAL_COMMUNITY): Payer: Self-pay

## 2024-10-15 DIAGNOSIS — R1084 Generalized abdominal pain: Secondary | ICD-10-CM | POA: Insufficient documentation

## 2024-10-15 DIAGNOSIS — K5900 Constipation, unspecified: Secondary | ICD-10-CM | POA: Insufficient documentation

## 2024-10-15 DIAGNOSIS — R109 Unspecified abdominal pain: Secondary | ICD-10-CM

## 2024-10-15 DIAGNOSIS — R112 Nausea with vomiting, unspecified: Secondary | ICD-10-CM | POA: Insufficient documentation

## 2024-10-15 LAB — CBC
HCT: 46.1 % (ref 39.0–52.0)
Hemoglobin: 16.3 g/dL (ref 13.0–17.0)
MCH: 30.5 pg (ref 26.0–34.0)
MCHC: 35.4 g/dL (ref 30.0–36.0)
MCV: 86.3 fL (ref 80.0–100.0)
Platelets: 224 K/uL (ref 150–400)
RBC: 5.34 MIL/uL (ref 4.22–5.81)
RDW: 13.2 % (ref 11.5–15.5)
WBC: 11.6 K/uL — ABNORMAL HIGH (ref 4.0–10.5)
nRBC: 0 % (ref 0.0–0.2)

## 2024-10-15 LAB — URINE DRUG SCREEN
Amphetamines: NEGATIVE
Barbiturates: NEGATIVE
Benzodiazepines: NEGATIVE
Cocaine: NEGATIVE
Fentanyl: NEGATIVE
Methadone Scn, Ur: NEGATIVE
Opiates: NEGATIVE
Tetrahydrocannabinol: POSITIVE — AB

## 2024-10-15 LAB — URINALYSIS, ROUTINE W REFLEX MICROSCOPIC
Bilirubin Urine: NEGATIVE
Glucose, UA: NEGATIVE mg/dL
Hgb urine dipstick: NEGATIVE
Ketones, ur: 20 mg/dL — AB
Leukocytes,Ua: NEGATIVE
Nitrite: NEGATIVE
Protein, ur: NEGATIVE mg/dL
Specific Gravity, Urine: 1.021 (ref 1.005–1.030)
pH: 6 (ref 5.0–8.0)

## 2024-10-15 LAB — COMPREHENSIVE METABOLIC PANEL WITH GFR
ALT: 9 U/L (ref 0–44)
AST: 37 U/L (ref 15–41)
Albumin: 5 g/dL (ref 3.5–5.0)
Alkaline Phosphatase: 93 U/L (ref 38–126)
Anion gap: 17 — ABNORMAL HIGH (ref 5–15)
BUN: 14 mg/dL (ref 6–20)
CO2: 19 mmol/L — ABNORMAL LOW (ref 22–32)
Calcium: 9.8 mg/dL (ref 8.9–10.3)
Chloride: 103 mmol/L (ref 98–111)
Creatinine, Ser: 0.81 mg/dL (ref 0.61–1.24)
GFR, Estimated: >60 mL/min
Glucose, Bld: 155 mg/dL — ABNORMAL HIGH (ref 70–99)
Potassium: 3.8 mmol/L (ref 3.5–5.1)
Sodium: 139 mmol/L (ref 135–145)
Total Bilirubin: 0.4 mg/dL (ref 0.0–1.2)
Total Protein: 7.9 g/dL (ref 6.5–8.1)

## 2024-10-15 LAB — LIPASE, BLOOD: Lipase: 16 U/L (ref 11–51)

## 2024-10-15 LAB — TROPONIN T, HIGH SENSITIVITY: Troponin T High Sensitivity: <15 ng/L (ref 0–19)

## 2024-10-15 MED ORDER — FAMOTIDINE 20 MG PO TABS
20.0000 mg | ORAL_TABLET | Freq: Once | ORAL | Status: AC
  Administered 2024-10-15: 20 mg via ORAL
  Filled 2024-10-15: qty 1

## 2024-10-15 MED ORDER — SODIUM CHLORIDE 0.9 % IV BOLUS
1000.0000 mL | Freq: Once | INTRAVENOUS | Status: AC
  Administered 2024-10-15: 1000 mL via INTRAVENOUS

## 2024-10-15 MED ORDER — SUCRALFATE 1 G PO TABS: 1.0000 g | ORAL_TABLET | Freq: Three times a day (TID) | ORAL | 0 refills | Status: DC | PRN

## 2024-10-15 MED ORDER — DROPERIDOL 2.5 MG/ML IJ SOLN
1.2500 mg | Freq: Once | INTRAMUSCULAR | Status: AC
  Administered 2024-10-15: 1.25 mg via INTRAVENOUS
  Filled 2024-10-15: qty 2

## 2024-10-15 MED ORDER — ALUM & MAG HYDROXIDE-SIMETH 200-200-20 MG/5ML PO SUSP
30.0000 mL | Freq: Once | ORAL | Status: AC
  Administered 2024-10-15: 30 mL via ORAL
  Filled 2024-10-15: qty 30

## 2024-10-15 MED ORDER — OMEPRAZOLE 20 MG PO CPDR: 20.0000 mg | DELAYED_RELEASE_CAPSULE | Freq: Every day | ORAL | 0 refills | Status: DC

## 2024-10-15 NOTE — ED Provider Notes (Signed)
 " [place] EMERGENCY DEPARTMENT AT [facility] Provider Note   CSN: [id number] Arrival date & time: 10/15/24  9184     Patient presents with: Abdominal Pain, Emesis, Nausea, and Constipation  HPI Jaime CASHER is a 28 y.o. male with seizures, substance use disorder presenting for abdominal pain.  Has been going on for 3 weeks somewhat worse last night.  Also with nausea and vomiting.  Pain is all about the upper abdomen.  Similar pain last night and was seen at [facility] also having issues with constipation.  CT scan last night showed no abnormalities.  Denies chest pain or shortness of breath.  Denies radiation of that pain to the back.    Abdominal Pain Associated symptoms: constipation and vomiting   Emesis Associated symptoms: abdominal pain   Constipation Associated symptoms: abdominal pain and vomiting        Prior to Admission medications  Medication Sig Start Date End Date Taking? Authorizing Provider  omeprazole  (PRILOSEC) 20 MG capsule Take 1 capsule (20 mg total) by mouth daily. 10/15/24 11/14/24 Yes Josalin Carneiro K, PA-C  sucralfate  (CARAFATE ) 1 g tablet Take 1 tablet (1 g total) by mouth 3 (three) times daily as needed. 10/15/24 10/25/24 Yes Tallon Gertz K, PA-C  acetaminophen  (TYLENOL ) 500 MG tablet Take 4 tablets by mouth every 6 (six) hours as needed for moderate pain (pain score 4-6) or mild pain (pain score 1-3).    [provider]  amoxicillin -clavulanate (AUGMENTIN ) 875-125 MG tablet Take 1 tablet by mouth 2 (two) times daily. 12/23/23   Christopher Savannah, PA-C  ibuprofen  (ADVIL ) 800 MG tablet Take 800 mg by mouth every 6 (six) hours as needed. 10/03/23   [provider]  naproxen  (NAPROSYN ) 500 MG tablet Take 1 tablet (500 mg total) by mouth 2 (two) times daily with a meal. 12/23/23   Christopher Savannah, PA-C  levETIRAcetam  (KEPPRA ) 500 MG tablet Take 1 tablet (500 mg total) by mouth 2 (two) times daily. 12/13/18 02/07/20  Harris, Abigail, PA-C     Allergies: Dexamethasone  and Prednisone    Review of Systems  Gastrointestinal:  Positive for abdominal pain, constipation and vomiting.    Updated Vital Signs BP (!) 145/103 (BP Location: Right Arm)   Pulse 76   Temp 98.1 F (36.7 C) (Oral)   Resp 18   Ht 6' (1.829 m)   Wt 90 kg   SpO2 100%   BMI 26.91 kg/m   Physical Exam Vitals and nursing note reviewed.  Constitutional:      General: He is not in acute distress.    Appearance: He is well-developed.  HENT:     Head: Normocephalic and atraumatic.     Mouth/Throat:     Mouth: Mucous membranes are moist.  Eyes:     General:        Right eye: No discharge.        Left eye: No discharge.     Conjunctiva/sclera: Conjunctivae normal.  Cardiovascular:     Rate and Rhythm: Normal rate and regular rhythm.     Pulses: Normal pulses.          Radial pulses are 2+ on the right side and 2+ on the left side.       Dorsalis pedis pulses are 2+ on the right side and 2+ on the left side.     Heart sounds: Normal heart sounds. No murmur heard. Pulmonary:     Effort: Pulmonary effort is normal. No respiratory distress.  Breath sounds: Normal breath sounds.  Abdominal:     General: Abdomen is flat. There is no distension.     Palpations: Abdomen is soft.     Tenderness: There is generalized abdominal tenderness.  Musculoskeletal:        General: No swelling.     Cervical back: Neck supple.  Skin:    General: Skin is warm and dry.     Capillary Refill: Capillary refill takes less than 2 seconds.  Neurological:     General: No focal deficit present.     Mental Status: He is alert.  Psychiatric:        Mood and Affect: Mood is anxious. Affect is angry.     (all labs ordered are listed, but only abnormal results are displayed) Labs Reviewed  COMPREHENSIVE METABOLIC PANEL WITH GFR - Abnormal; Notable for the following components:      Result Value   CO2 19 (*)    Glucose, Bld 155 (*)    Anion gap 17 (*)    All  other components within normal limits  CBC - Abnormal; Notable for the following components:   WBC 11.6 (*)    All other components within normal limits  URINALYSIS, ROUTINE W REFLEX MICROSCOPIC - Abnormal; Notable for the following components:   Ketones, ur 20 (*)    All other components within normal limits  LIPASE, BLOOD  URINE DRUG SCREEN  TROPONIN T, HIGH SENSITIVITY    EKG: EKG Interpretation Date/Time:  Friday October 15 2024 09:19:57 EST Ventricular Rate:  83 PR Interval:  130 QRS Duration:  96 QT Interval:  381 QTC Calculation: 448 R Axis:   67  Text Interpretation: Sinus rhythm Confirmed by Dasie Faden ([id number]) on 10/15/2024 10:11:46 AM  Radiology: [facility] Chest Port 1 View Result Date: 10/15/2024 CLINICAL DATA:  Constipation with abdominal pain, nausea and vomiting x3 weeks. EXAM: PORTABLE CHEST 1 VIEW COMPARISON:  May 25, 2018 FINDINGS: The heart size and mediastinal contours are within normal limits. Both lungs are clear. The visualized skeletal structures are unremarkable. IMPRESSION: No active disease. Electronically Signed   By: Suzen Dials M.D.   On: 10/15/2024 10:25   CT ABDOMEN PELVIS W CONTRAST Result Date: 10/14/2024 EXAM: CT ABDOMEN AND PELVIS WITH CONTRAST 10/14/2024 05:53:45 PM TECHNIQUE: CT of the abdomen and pelvis was performed with the administration of 100 mL of iohexol  (OMNIPAQUE ) 300 MG/ML solution. Multiplanar reformatted images are provided for review. Automated exposure control, iterative reconstruction, and/or weight-based adjustment of the mA/kV was utilized to reduce the radiation dose to as low as reasonably achievable. COMPARISON: None available. CLINICAL HISTORY: Bowel obstruction suspected; abd pain, constipation, last BM 3 wks ago. FINDINGS: LOWER CHEST: No acute abnormality. LIVER: The liver is unremarkable. GALLBLADDER AND BILE DUCTS: Gallbladder is unremarkable. No biliary ductal dilatation. SPLEEN: No acute abnormality. PANCREAS: No  acute abnormality. ADRENAL GLANDS: No acute abnormality. KIDNEYS, URETERS AND BLADDER: No stones in the kidneys or ureters. No hydronephrosis. No perinephric or periureteral stranding. Urinary bladder is unremarkable. GI AND BOWEL: Appendix normal. The stomach, small bowel, and large bowel are otherwise unremarkable. There is no bowel obstruction. PERITONEUM AND RETROPERITONEUM: No ascites. No free air. VASCULATURE: Aorta is normal in caliber. LYMPH NODES: No lymphadenopathy. REPRODUCTIVE ORGANS: No acute abnormality. BONES AND SOFT TISSUES: No acute osseous abnormality. No focal soft tissue abnormality. IMPRESSION: 1. No acute findings. No evidence of bowel obstruction. Electronically signed by: Dorethia Molt MD 10/14/2024 06:31 PM EST RP Workstation: [id number]  Procedures   Medications Ordered in the ED  droperidol  (INAPSINE ) 2.5 MG/ML injection 1.25 mg (1.25 mg Intravenous Given 10/15/24 1010)  sodium chloride  0.9 % bolus 1,000 mL (1,000 mLs Intravenous New Bag/Given 10/15/24 1018)  alum & mag hydroxide-simeth (MAALOX/MYLANTA) 200-200-20 MG/5ML suspension 30 mL (30 mLs Oral Given 10/15/24 1211)  famotidine  (PEPCID ) tablet 20 mg (20 mg Oral Given 10/15/24 1211)                                    Medical Decision Making Amount and/or Complexity of Data Reviewed Labs: ordered. Radiology: ordered.  Risk OTC drugs. Prescription drug management.   Initial Impression and Ddx 28 year old well-appearing male presenting for abdominal pain.  Exam notable for generalized abdominal tenderness.  Initially angry and anxious and reluctant to allow nurses to establish IV access.  After about an hour were able to place IV and administer medications.  DDx includes acute pancreatitis, acute cholecystitis, appendicitis, kidney stone, electrolyte derangement, less likely aortic dissection, ACS, other. Patient PMH that increases complexity of ED encounter:   seizures, substance use  Interpretation of  Diagnostics - I independent reviewed and interpreted the labs as followed: WBC 11.6  - I independently visualized the following imaging with scope of interpretation limited to determining acute life threatening conditions related to emergency care: CXR, which revealed no acute findings  Review CT of the ab pelvis performed yesterday at [facility] and there were no acute findings  -I have personally reviewed and interpreted EKG which revealed sinus rhythm, QTc 448  Patient Reassessment and Ultimate Disposition/Management Given fluids, droperidol  and then later GI cocktail.  On room assessment he was resting comfortably in the bed.  States that symptoms have improved considerably.  No vomiting during this encounter.  Chest pain workup also unremarkable.  Considered dissection but unlikely given symmetric pulses no widening of the mediastinum on x-ray and lack of typical symptoms.  Fluid challenge with no issue.  Between evaluation today and yesterday which were largely reassuring feel that he is safe for discharge.  Does appear per chart review that he has recurrence of abdominal pain.  Could be gastric ulcer and/or bowel spasm.  Advised him to follow-up with GI and PCP.  Discussed return precautions.  Sent omeprazole  and Carafate  to his pharmacy.  Patient management required discussion with the following services or consulting groups:  None  Complexity of Problems Addressed Acute complicated illness or Injury  Additional Data Reviewed and Analyzed Further history obtained from: Past medical history and medications listed in the EMR and Prior ED visit notes  Patient Encounter Risk Assessment Consideration of hospitalization      Final diagnoses:  Abdominal pain, unspecified abdominal location    ED Discharge Orders          Ordered    omeprazole  (PRILOSEC) 20 MG capsule  Daily        10/15/24 1238    sucralfate  (CARAFATE ) 1 g tablet  3 times daily PRN        10/15/24 1238                Lang Norleen POUR, PA-C 10/15/24 1242  "

## 2024-10-15 NOTE — ED Triage Notes (Signed)
 BIB EMS due to 3 weeks constipation, abd pain N/V. Seen yesterday for same at [facility] 148/84-100-97% RA CBG 139

## 2024-10-15 NOTE — ED Notes (Addendum)
 Just finished hooking patient up to cardiac monitor and vitals. Was getting ready to start IV on pt. Pt stated,I am going to freak out.... I am going to freak out.... I am going to lose it. Pt was informed that we are helping him and that we need to get vitals and an IV to be able to care for him. Pt yelled and stood up ripping the cardiac cords off and standing. Pt instructed that we need him to keep leads on and sit in bed for us  to be able to get IV. Pt said he understood but wouldn't at this time. Pt sat in the floor. This RN told pt to let us  know when he is ready and we will return to place him back on the monitor and get an IV started.

## 2024-10-15 NOTE — ED Notes (Signed)
 Pt and wife are using negative language to staff, yelling out, wife coming out of room telling people He needs his fucking meds. Security aware and on stand by, nurse is trying to obtain and give meds as ordered. Delay due to difficulty obtain IV, this has been accomplished.  Writer has spoken to pt with update and requested he not using negative language to staff. He verbalizes he will do better and apologized to clinical research associate.

## 2024-10-15 NOTE — ED Notes (Signed)
 Pt was found with fingers in the back of his throat trying to help himself vomit. Pt was educated that this is not best practice

## 2024-10-15 NOTE — ED Notes (Signed)
 Xray in room. Pt is refusing to let

## 2024-10-15 NOTE — ED Notes (Signed)
 Pt is aware urine is needed but states he is unable to at this time

## 2024-10-15 NOTE — ED Notes (Addendum)
 Pt wife outside of room trying to get a nurses attention. States she wants a different nurse for her husband. Told her I am also a nurse that is working with her husband. Pt stated he should have had his meds and hour and a half ago. Wife agrees. RN informed pt and wife that everything to get an IV had been brought in for the patient twice by this nurse, immediately upon arrival while getting vitals and once after and both times pt had refused. RN they didn't want in the room had attempted an IV and failed. Charge RN stepped in and placed IV. This nurse had a more critical pt arriving via EMS and had to triage and care for other pt. Cleo Villamizar was informed with his wife that it would be a minimum of 5-15 min until this nurse would be available to give meds. Pt wife acknowledged and went back into room. When this nurse went into other pt room she could hear wife cussing and screaming in the hallway that her husband needed his medication now. Security had to be called. This RN finished caring for other pt and then went to [employer] room with meds. Pt stated he should have already had meds. RN again reminded this pt that he had refused both times this nurse had tried earlier. Pt stated,That is not the point. RN proceeded to give pt the medications the provider had ordered. Wife informed if she came out aggressively again she would be escorted from property

## 2024-10-15 NOTE — ED Notes (Signed)
 Patient continues to put his finger down his throat to make himself throw up, after the nursing staff has repeatedly asked him not to.  Wife said to diplomatic services operational officer,  he needs his fucking medicine after nurse said she will be in there to give it in a few minutes.  She is in another patients room.

## 2024-10-15 NOTE — Discharge Instructions (Signed)
 Evaluation today was reassuring.  However given the persistence of your abdominal pain feel it would be beneficial for you to follow-up with gastroenterology.  In the meantime I started you on omeprazole  and Carafate  to treat for reflux.  If your symptoms worsen please return to the ED for further evaluation.  Also would follow-up with your PCP.

## 2024-10-15 NOTE — ED Notes (Signed)
 Patient screaming where's my damn medicine.

## 2024-10-15 NOTE — ED Triage Notes (Signed)
 Patient has upper abdominal pain for 3 weeks. Has been vomiting. No diarrhea. Crying in triage due to pain.

## 2024-10-16 ENCOUNTER — Emergency Department (HOSPITAL_BASED_OUTPATIENT_CLINIC_OR_DEPARTMENT_OTHER)
Admission: EM | Admit: 2024-10-16 | Discharge: 2024-10-16 | Discharge status: Against Medical Advice | Payer: Self-pay | Attending: Emergency Medicine | Admitting: Emergency Medicine

## 2024-10-16 ENCOUNTER — Other Ambulatory Visit: Payer: Self-pay

## 2024-10-16 ENCOUNTER — Encounter (HOSPITAL_BASED_OUTPATIENT_CLINIC_OR_DEPARTMENT_OTHER): Payer: Self-pay

## 2024-10-16 DIAGNOSIS — Z5321 Procedure and treatment not carried out due to patient leaving prior to being seen by health care provider: Secondary | ICD-10-CM | POA: Insufficient documentation

## 2024-10-16 DIAGNOSIS — R109 Unspecified abdominal pain: Secondary | ICD-10-CM | POA: Insufficient documentation

## 2024-10-16 DIAGNOSIS — R112 Nausea with vomiting, unspecified: Secondary | ICD-10-CM | POA: Insufficient documentation

## 2024-10-16 LAB — COMPREHENSIVE METABOLIC PANEL WITH GFR
ALT: 39 U/L (ref 0–44)
AST: 98 U/L — ABNORMAL HIGH (ref 15–41)
Albumin: 5.4 g/dL — ABNORMAL HIGH (ref 3.5–5.0)
Alkaline Phosphatase: 98 U/L (ref 38–126)
Anion gap: 21 — ABNORMAL HIGH (ref 5–15)
BUN: 27 mg/dL — ABNORMAL HIGH (ref 6–20)
CO2: 18 mmol/L — ABNORMAL LOW (ref 22–32)
Calcium: 11.1 mg/dL — ABNORMAL HIGH (ref 8.9–10.3)
Chloride: 99 mmol/L (ref 98–111)
Creatinine, Ser: 1.27 mg/dL — ABNORMAL HIGH (ref 0.61–1.24)
GFR, Estimated: >60 mL/min
Glucose, Bld: 135 mg/dL — ABNORMAL HIGH (ref 70–99)
Potassium: 3.6 mmol/L (ref 3.5–5.1)
Sodium: 138 mmol/L (ref 135–145)
Total Bilirubin: 0.7 mg/dL (ref 0.0–1.2)
Total Protein: 8.9 g/dL — ABNORMAL HIGH (ref 6.5–8.1)

## 2024-10-16 LAB — URINALYSIS, ROUTINE W REFLEX MICROSCOPIC
Bacteria, UA: NONE SEEN
Glucose, UA: NEGATIVE mg/dL
Hgb urine dipstick: NEGATIVE
Nitrite: NEGATIVE
Protein, ur: 100 mg/dL — AB
Specific Gravity, Urine: 1.036 — ABNORMAL HIGH (ref 1.005–1.030)
pH: 5.5 (ref 5.0–8.0)

## 2024-10-16 LAB — CBC
HCT: 48.8 % (ref 39.0–52.0)
Hemoglobin: 17.4 g/dL — ABNORMAL HIGH (ref 13.0–17.0)
MCH: 30.1 pg (ref 26.0–34.0)
MCHC: 35.7 g/dL (ref 30.0–36.0)
MCV: 84.3 fL (ref 80.0–100.0)
Platelets: 287 K/uL (ref 150–400)
RBC: 5.79 MIL/uL (ref 4.22–5.81)
RDW: 13.3 % (ref 11.5–15.5)
WBC: 17.1 K/uL — ABNORMAL HIGH (ref 4.0–10.5)
nRBC: 0 % (ref 0.0–0.2)

## 2024-10-16 LAB — LIPASE, BLOOD: Lipase: 17 U/L (ref 11–51)

## 2024-10-16 MED ORDER — ONDANSETRON 4 MG PO TBDP
4.0000 mg | ORAL_TABLET | Freq: Once | ORAL | Status: AC | PRN
  Administered 2024-10-16: 4 mg via ORAL
  Filled 2024-10-16: qty 1

## 2024-10-16 NOTE — ED Notes (Signed)
 Patient seen ambulating out of the ER, fully clothed, with a steady gait.

## 2024-10-16 NOTE — ED Notes (Signed)
 Patient witnessed sitting himself on the floor, hugging his stomach, stating I need help, I can't keep throwing up, my stomach hurts so bad.  Patient made aware that a provider hasn't signed up for him yet, but this RN would make the primary RN and ED provider aware.

## 2024-10-16 NOTE — ED Triage Notes (Signed)
 Pt reports abd pain for several weeks and N/V. Pt seen for same at [facility] earlier this week. PT reports feeling like doctors aren't taking me seriously.

## 2024-10-17 ENCOUNTER — Other Ambulatory Visit: Payer: Self-pay

## 2024-10-17 ENCOUNTER — Emergency Department (HOSPITAL_COMMUNITY): Payer: MEDICAID

## 2024-10-17 ENCOUNTER — Encounter (HOSPITAL_COMMUNITY): Payer: Self-pay

## 2024-10-17 ENCOUNTER — Inpatient Hospital Stay (HOSPITAL_COMMUNITY)
Admission: EM | Admit: 2024-10-17 | Discharge: 2024-10-19 | DRG: 392 | Disposition: A | Discharge status: Home / Self Care | Payer: MEDICAID | Attending: Internal Medicine | Admitting: Internal Medicine

## 2024-10-17 DIAGNOSIS — F17210 Nicotine dependence, cigarettes, uncomplicated: Secondary | ICD-10-CM | POA: Diagnosis present

## 2024-10-17 DIAGNOSIS — R112 Nausea with vomiting, unspecified: Secondary | ICD-10-CM | POA: Diagnosis present

## 2024-10-17 DIAGNOSIS — D72829 Elevated white blood cell count, unspecified: Secondary | ICD-10-CM | POA: Diagnosis present

## 2024-10-17 DIAGNOSIS — Z87820 Personal history of traumatic brain injury: Secondary | ICD-10-CM

## 2024-10-17 DIAGNOSIS — R1116 Cannabis hyperemesis syndrome: Principal | ICD-10-CM | POA: Diagnosis present

## 2024-10-17 DIAGNOSIS — Z888 Allergy status to other drugs, medicaments and biological substances status: Secondary | ICD-10-CM

## 2024-10-17 DIAGNOSIS — Z79899 Other long term (current) drug therapy: Secondary | ICD-10-CM

## 2024-10-17 DIAGNOSIS — F1290 Cannabis use, unspecified, uncomplicated: Secondary | ICD-10-CM | POA: Diagnosis present

## 2024-10-17 DIAGNOSIS — R7401 Elevation of levels of liver transaminase levels: Principal | ICD-10-CM | POA: Diagnosis present

## 2024-10-17 DIAGNOSIS — N179 Acute kidney failure, unspecified: Secondary | ICD-10-CM | POA: Diagnosis present

## 2024-10-17 DIAGNOSIS — E860 Dehydration: Secondary | ICD-10-CM | POA: Diagnosis present

## 2024-10-17 DIAGNOSIS — K5900 Constipation, unspecified: Secondary | ICD-10-CM | POA: Diagnosis present

## 2024-10-17 LAB — COMPREHENSIVE METABOLIC PANEL WITH GFR
ALT: 101 U/L — ABNORMAL HIGH (ref 0–44)
ALT: 119 U/L — ABNORMAL HIGH (ref 0–44)
AST: 241 U/L — ABNORMAL HIGH (ref 15–41)
AST: 194 U/L — ABNORMAL HIGH (ref 15–41)
Albumin: 5.5 g/dL — ABNORMAL HIGH (ref 3.5–5.0)
Albumin: 4.8 g/dL (ref 3.5–5.0)
Alkaline Phosphatase: 104 U/L (ref 38–126)
Alkaline Phosphatase: 93 U/L (ref 38–126)
Anion gap: 22 — ABNORMAL HIGH (ref 5–15)
Anion gap: 13 (ref 5–15)
BUN: 35 mg/dL — ABNORMAL HIGH (ref 6–20)
BUN: 21 mg/dL — ABNORMAL HIGH (ref 6–20)
CO2: 18 mmol/L — ABNORMAL LOW (ref 22–32)
CO2: 26 mmol/L (ref 22–32)
Calcium: 10.9 mg/dL — ABNORMAL HIGH (ref 8.9–10.3)
Calcium: 10.1 mg/dL (ref 8.9–10.3)
Chloride: 97 mmol/L — ABNORMAL LOW (ref 98–111)
Chloride: 100 mmol/L (ref 98–111)
Creatinine, Ser: 1.33 mg/dL — ABNORMAL HIGH (ref 0.61–1.24)
Creatinine, Ser: 0.89 mg/dL (ref 0.61–1.24)
GFR, Estimated: >60 mL/min
GFR, Estimated: >60 mL/min
Glucose, Bld: 153 mg/dL — ABNORMAL HIGH (ref 70–99)
Glucose, Bld: 124 mg/dL — ABNORMAL HIGH (ref 70–99)
Potassium: 4.3 mmol/L (ref 3.5–5.1)
Potassium: 4.6 mmol/L (ref 3.5–5.1)
Sodium: 137 mmol/L (ref 135–145)
Sodium: 139 mmol/L (ref 135–145)
Total Bilirubin: 0.8 mg/dL (ref 0.0–1.2)
Total Bilirubin: 0.6 mg/dL (ref 0.0–1.2)
Total Protein: 9.2 g/dL — ABNORMAL HIGH (ref 6.5–8.1)
Total Protein: 8.2 g/dL — ABNORMAL HIGH (ref 6.5–8.1)

## 2024-10-17 LAB — URINALYSIS, ROUTINE W REFLEX MICROSCOPIC
Bacteria, UA: NONE SEEN
Bilirubin Urine: NEGATIVE
Glucose, UA: NEGATIVE mg/dL
Hgb urine dipstick: NEGATIVE
Ketones, ur: NEGATIVE mg/dL
Leukocytes,Ua: NEGATIVE
Nitrite: NEGATIVE
Protein, ur: 100 mg/dL — AB
Specific Gravity, Urine: >1.046 — ABNORMAL HIGH (ref 1.005–1.030)
pH: 5 (ref 5.0–8.0)

## 2024-10-17 LAB — CBC WITH DIFFERENTIAL/PLATELET
Abs Immature Granulocytes: 0.04 K/uL (ref 0.00–0.07)
Basophils Absolute: 0 K/uL (ref 0.0–0.1)
Basophils Relative: 0 %
Eosinophils Absolute: 0 K/uL (ref 0.0–0.5)
Eosinophils Relative: 0 %
HCT: 50.1 % (ref 39.0–52.0)
Hemoglobin: 17.7 g/dL — ABNORMAL HIGH (ref 13.0–17.0)
Immature Granulocytes: 0 %
Lymphocytes Relative: 11 %
Lymphs Abs: 1.6 K/uL (ref 0.7–4.0)
MCH: 29.8 pg (ref 26.0–34.0)
MCHC: 35.3 g/dL (ref 30.0–36.0)
MCV: 84.5 fL (ref 80.0–100.0)
Monocytes Absolute: 1.3 K/uL — ABNORMAL HIGH (ref 0.1–1.0)
Monocytes Relative: 9 %
Neutro Abs: 11.5 K/uL — ABNORMAL HIGH (ref 1.7–7.7)
Neutrophils Relative %: 80 %
Platelets: 317 K/uL (ref 150–400)
RBC: 5.93 MIL/uL — ABNORMAL HIGH (ref 4.22–5.81)
RDW: 13.2 % (ref 11.5–15.5)
WBC: 14.4 K/uL — ABNORMAL HIGH (ref 4.0–10.5)
nRBC: 0 % (ref 0.0–0.2)

## 2024-10-17 LAB — HEPATITIS PANEL, ACUTE
HCV Ab: NONREACTIVE
Hep A IgM: NONREACTIVE
Hep B C IgM: NONREACTIVE
Hepatitis B Surface Ag: NONREACTIVE

## 2024-10-17 LAB — URINE DRUG SCREEN
Amphetamines: NEGATIVE
Barbiturates: NEGATIVE
Benzodiazepines: NEGATIVE
Cocaine: NEGATIVE
Fentanyl: NEGATIVE
Methadone Scn, Ur: NEGATIVE
Opiates: NEGATIVE
Tetrahydrocannabinol: POSITIVE — AB

## 2024-10-17 LAB — GLUCOSE, CAPILLARY: Glucose-Capillary: 130 mg/dL — ABNORMAL HIGH (ref 70–99)

## 2024-10-17 LAB — ACETAMINOPHEN LEVEL: Acetaminophen (Tylenol), Serum: <10 ug/mL — ABNORMAL LOW (ref 10–30)

## 2024-10-17 LAB — LIPASE, BLOOD: Lipase: 15 U/L (ref 11–51)

## 2024-10-17 LAB — ETHANOL: Alcohol, Ethyl (B): <15 mg/dL

## 2024-10-17 MED ORDER — ALUM & MAG HYDROXIDE-SIMETH 200-200-20 MG/5ML PO SUSP
15.0000 mL | Freq: Once | ORAL | Status: AC
  Administered 2024-10-17: 15 mL via ORAL
  Filled 2024-10-17: qty 30

## 2024-10-17 MED ORDER — POLYETHYLENE GLYCOL 3350 17 G PO PACK
17.0000 g | PACK | Freq: Every day | ORAL | Status: DC | PRN
  Administered 2024-10-17 – 2024-10-18 (×2): 17 g via ORAL
  Filled 2024-10-17 (×2): qty 1

## 2024-10-17 MED ORDER — ALBUTEROL SULFATE (2.5 MG/3ML) 0.083% IN NEBU: 2.5000 mg | INHALATION_SOLUTION | RESPIRATORY_TRACT | Status: DC | PRN

## 2024-10-17 MED ORDER — SENNOSIDES-DOCUSATE SODIUM 8.6-50 MG PO TABS
1.0000 | ORAL_TABLET | Freq: Two times a day (BID) | ORAL | Status: DC
  Administered 2024-10-17 – 2024-10-18 (×3): 1 via ORAL
  Filled 2024-10-17 (×4): qty 1

## 2024-10-17 MED ORDER — ONDANSETRON HCL 4 MG/2ML IJ SOLN
4.0000 mg | Freq: Once | INTRAMUSCULAR | Status: AC
  Administered 2024-10-17: 4 mg via INTRAVENOUS
  Filled 2024-10-17: qty 2

## 2024-10-17 MED ORDER — PANTOPRAZOLE SODIUM 40 MG IV SOLR
40.0000 mg | INTRAVENOUS | Status: DC
  Administered 2024-10-17 – 2024-10-18 (×2): 40 mg via INTRAVENOUS
  Filled 2024-10-17 (×2): qty 10

## 2024-10-17 MED ORDER — PANTOPRAZOLE SODIUM 40 MG IV SOLR
40.0000 mg | Freq: Once | INTRAVENOUS | Status: AC
  Administered 2024-10-17: 40 mg via INTRAVENOUS
  Filled 2024-10-17: qty 10

## 2024-10-17 MED ORDER — LACTATED RINGERS IV BOLUS
1000.0000 mL | Freq: Once | INTRAVENOUS | Status: AC
  Administered 2024-10-17: 1000 mL via INTRAVENOUS

## 2024-10-17 MED ORDER — TRAZODONE HCL 50 MG PO TABS
50.0000 mg | ORAL_TABLET | Freq: Every evening | ORAL | Status: DC | PRN
  Administered 2024-10-17 – 2024-10-18 (×2): 50 mg via ORAL
  Filled 2024-10-17 (×2): qty 1

## 2024-10-17 MED ORDER — IOHEXOL 350 MG/ML SOLN
100.0000 mL | Freq: Once | INTRAVENOUS | Status: AC | PRN
  Administered 2024-10-17: 100 mL via INTRAVENOUS

## 2024-10-17 MED ORDER — ALUM & MAG HYDROXIDE-SIMETH 200-200-20 MG/5ML PO SUSP
30.0000 mL | ORAL | Status: DC | PRN
  Administered 2024-10-17 – 2024-10-19 (×2): 30 mL via ORAL
  Filled 2024-10-17 (×3): qty 30

## 2024-10-17 MED ORDER — CAPSAICIN 0.025 % EX CREA
TOPICAL_CREAM | Freq: Once | CUTANEOUS | Status: AC
  Filled 2024-10-17: qty 60

## 2024-10-17 MED ORDER — SODIUM CHLORIDE 0.9 % IV BOLUS
1000.0000 mL | Freq: Once | INTRAVENOUS | Status: AC
  Administered 2024-10-17: 1000 mL via INTRAVENOUS

## 2024-10-17 MED ORDER — ACETAMINOPHEN 650 MG RE SUPP: 650.0000 mg | Freq: Four times a day (QID) | RECTAL | Status: DC | PRN

## 2024-10-17 MED ORDER — NICOTINE 14 MG/24HR TD PT24
14.0000 mg | MEDICATED_PATCH | Freq: Every day | TRANSDERMAL | Status: DC
  Administered 2024-10-17 – 2024-10-18 (×2): 14 mg via TRANSDERMAL
  Filled 2024-10-17 (×3): qty 1

## 2024-10-17 MED ORDER — ENOXAPARIN SODIUM 40 MG/0.4ML IJ SOSY: 40.0000 mg | PREFILLED_SYRINGE | INTRAMUSCULAR | Status: DC

## 2024-10-17 MED ORDER — ACETAMINOPHEN 325 MG PO TABS: 650.0000 mg | ORAL_TABLET | Freq: Four times a day (QID) | ORAL | Status: DC | PRN

## 2024-10-17 MED ORDER — DROPERIDOL 2.5 MG/ML IJ SOLN
2.5000 mg | Freq: Once | INTRAMUSCULAR | Status: AC
  Administered 2024-10-17: 2.5 mg via INTRAVENOUS
  Filled 2024-10-17: qty 2

## 2024-10-17 MED ORDER — METOCLOPRAMIDE HCL 5 MG/ML IJ SOLN
10.0000 mg | Freq: Four times a day (QID) | INTRAMUSCULAR | Status: DC | PRN
  Administered 2024-10-17 – 2024-10-18 (×3): 10 mg via INTRAVENOUS
  Filled 2024-10-17 (×4): qty 2

## 2024-10-17 MED ORDER — LACTATED RINGERS IV SOLN: INTRAVENOUS | Status: AC

## 2024-10-17 NOTE — H&P (Signed)
 " History and Physical  Jaime Nixon FMW:[id number] DOB: April 15, 1996 DOA: 10/17/2024  PCP: Patient, No Pcp Per   Chief Complaint: Abdominal pain, vomiting  HPI: Jaime Nixon is a 28 y.o. male with medical history significant for TBI, seizures, marijuana use admitted to the hospital with several weeks of intractable abdominal pain, and associated vomiting.  He denies any hematemesis, significant diarrhea, says that his abdominal pain was all over.  By the time I evaluated him on the medical floor this morning, he states that he is feeling much better, he is hungry, he would like to eat something.  He currently just has some very minimal abdominal discomfort which he thinks is just related to feeling hungry.  He just got done taking a hot shower, which he says helps his symptoms significantly.  Review of Systems: Please see HPI for pertinent positives and negatives. A complete 10 system review of systems are otherwise negative.  Past Medical History:  Diagnosis Date   Current smoker    History of substance use    Seizures (HCC)    History reviewed. No pertinent surgical history. Social History:  reports that he has been smoking cigarettes. He has a 10 pack-year smoking history. He has never used smokeless tobacco. He reports that he does not currently use alcohol. He reports that he does not currently use drugs after having used the following drugs: Marijuana.  Allergies[1]  Family History  Problem Relation Age of Onset   Seizures Father    Seizures Brother      Prior to Admission medications  Medication Sig Start Date End Date Taking? Authorizing Provider  acetaminophen  (TYLENOL ) 500 MG tablet Take 4 tablets by mouth every 6 (six) hours as needed for moderate pain (pain score 4-6) or mild pain (pain score 1-3).    [provider]  amoxicillin -clavulanate (AUGMENTIN ) 875-125 MG tablet Take 1 tablet by mouth 2 (two) times daily. 12/23/23   Christopher Savannah, PA-C  ibuprofen   (ADVIL ) 800 MG tablet Take 800 mg by mouth every 6 (six) hours as needed. 10/03/23   [provider]  naproxen  (NAPROSYN ) 500 MG tablet Take 1 tablet (500 mg total) by mouth 2 (two) times daily with a meal. 12/23/23   Christopher Savannah, PA-C  omeprazole  (PRILOSEC) 20 MG capsule Take 1 capsule (20 mg total) by mouth daily. 10/15/24 11/14/24  Robinson, John K, PA-C  sucralfate  (CARAFATE ) 1 g tablet Take 1 tablet (1 g total) by mouth 3 (three) times daily as needed. 10/15/24 10/25/24  Robinson, John K, PA-C  levETIRAcetam  (KEPPRA ) 500 MG tablet Take 1 tablet (500 mg total) by mouth 2 (two) times daily. 12/13/18 02/07/20  Arloa Chroman, PA-C    Physical Exam: BP 129/89 (BP Location: Right Arm)   Pulse 75   Temp 98 F (36.7 C)   Resp 18   SpO2 99%  General:  Alert, oriented, calm, in no acute distress, ambulating in his room Eyes: EOMI, clear conjuctivae, white sclerea Neck: supple, no masses, trachea mildline  Cardiovascular: RRR, no murmurs or rubs, no peripheral edema  Respiratory: clear to auscultation bilaterally, no wheezes, no crackles  Abdomen: soft, nontender, nondistended, normal bowel tones heard  Skin: dry, no rashes  Musculoskeletal: no joint effusions, normal range of motion  Psychiatric: appropriate affect, normal speech  Neurologic: extraocular muscles intact, clear speech, moving all extremities with intact sensorium         Labs on Admission:  Basic Metabolic Panel: Recent Labs  Lab 10/14/24 1640 10/15/24  1004 10/16/24 1921 10/17/24 0253  NA 137 139 138 137  K 3.6 3.8 3.6 4.3  CL 101 103 99 97*  CO2 21* 19* 18* 18*  GLUCOSE 125* 155* 135* 153*  BUN 15 14 27* 35*  CREATININE 0.79 0.81 1.27* 1.33*  CALCIUM 10.3 9.8 11.1* 10.9*   Liver Function Tests: Recent Labs  Lab 10/14/24 1640 10/15/24 1004 10/16/24 1921 10/17/24 0253  AST 27 37 98* 241*  ALT 9 9 39 101*  ALKPHOS 92 93 98 104  BILITOT 0.4 0.4 0.7 0.8  PROT 8.1 7.9 8.9* 9.2*  ALBUMIN 5.0 5.0 5.4*  5.5*   Recent Labs  Lab 10/14/24 1640 10/15/24 1004 10/16/24 1921 10/17/24 0253  LIPASE 25 16 17 15    No results for input(s): AMMONIA in the last 168 hours. CBC: Recent Labs  Lab 10/14/24 1640 10/15/24 1004 10/16/24 1921 10/17/24 0253  WBC 9.1 11.6* 17.1* 14.4*  NEUTROABS  --   --   --  11.5*  HGB 15.8 16.3 17.4* 17.7*  HCT 45.6 46.1 48.8 50.1  MCV 87.0 86.3 84.3 84.5  PLT 218 224 287 317   Cardiac Enzymes: No results for input(s): CKTOTAL, CKMB, CKMBINDEX, TROPONINI in the last 168 hours. BNP (last 3 results) No results for input(s): BNP in the last 8760 hours.  ProBNP (last 3 results) No results for input(s): PROBNP in the last 8760 hours.  CBG: Recent Labs  Lab 10/17/24 0908  GLUCAP 130*    Radiological Exams on Admission: CT Angio Abd/Pel W and/or Wo Contrast Result Date: 10/17/2024 EXAM: CTA ABDOMEN AND PELVIS WITHOUT AND WITH CONTRAST 10/17/2024 03:18:46 AM TECHNIQUE: CTA images of the abdomen and pelvis without and with intravenous contrast. 100 mL (iohexol  (OMNIPAQUE ) 350 MG/ML injection 100 mL IOHEXOL  350 MG/ML SOLN) was administered. Three-dimensional MIP/volume rendered formations were performed. Automated exposure control, iterative reconstruction, and/or weight based adjustment of the mA/kV was utilized to reduce the radiation dose to as low as reasonably achievable. COMPARISON: 10/14/2024 CLINICAL HISTORY: Lower GI bleed FINDINGS: VASCULATURE: No acute finding. AORTA: No acute finding. No abdominal aortic aneurysm. No dissection. CELIAC TRUNK: No acute finding. No occlusion or significant stenosis. SUPERIOR MESENTERIC ARTERY: No acute finding. No occlusion or significant stenosis. RENAL ARTERIES: No acute finding. No occlusion or significant stenosis. ILIAC ARTERIES: No acute finding. No occlusion or significant stenosis. LIVER: The liver is unremarkable. GALLBLADDER AND BILE DUCTS: Gallbladder is unremarkable. No biliary ductal dilatation.  SPLEEN: The spleen is unremarkable. PANCREAS: The pancreas is unremarkable. ADRENAL GLANDS: Bilateral adrenal glands demonstrate no acute abnormality. KIDNEYS, URETERS AND BLADDER: No stones in the kidneys or ureters. No hydronephrosis. No perinephric or periureteral stranding. Urinary bladder is unremarkable. GI AND BOWEL: The appendix is within normal limits. No findings to suggest acute GI hemorrhage are noted. No obstructive changes of the colon are seen. The stomach and small bowel are within normal limits. There is no bowel obstruction. No abnormal bowel wall thickening or distension. REPRODUCTIVE: The prostate is within normal limits. PERITONEUM AND RETRPERITONEUM: No ascites or free air. LUNG BASE: No acute abnormality. LYMPH NODES: No lymphadenopathy. BONES AND SOFT TISSUES: No acute abnormality of the bones. No acute soft tissue abnormality. IMPRESSION: 1. No findings to suggest acute GI hemorrhage. Electronically signed by: Oneil Devonshire MD 10/17/2024 03:27 AM EST RP Workstation: [id number] Chest Port 1 View Result Date: 10/15/2024 CLINICAL DATA:  Constipation with abdominal pain, nausea and vomiting x3 weeks. EXAM: PORTABLE CHEST 1 VIEW COMPARISON:  May 25, 2018  FINDINGS: The heart size and mediastinal contours are within normal limits. Both lungs are clear. The visualized skeletal structures are unremarkable. IMPRESSION: No active disease. Electronically Signed   By: Suzen Dials M.D.   On: 10/15/2024 10:25   Assessment/Plan Jaime Nixon is a 28 y.o. 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 -Observation admission -Continue gentle IV fluids -Advance diet to regular -Capsaicin  cream, Reglan  as needed for vomiting  Likely discharge home later this morning if remains stable  Abnormal  LFTs-unclear etiology at this time, checking acute hepatitis panel.  CT abdomen pelvis without acute findings, liver looks unremarkable.    DVT prophylaxis: Lovenox      Code Status: Full Code  Consults called: None  Admission status: Observation  Time spent: 30 minutes  Jaime Nixon Gail MD [facility] Pager [phone]  If 7PM-7AM, please contact night-coverage [email or web address] Password [email or web address]  10/17/2024, 9:18 AM      [1]  Allergies Allergen Reactions   Dexamethasone  Shortness Of Breath and Other (See Comments)    Makes pt angry    Prednisone Other (See Comments)    Makes pt angry    "

## 2024-10-17 NOTE — Plan of Care (Signed)
  Problem: Health Behavior/Discharge Planning: Goal: Ability to manage health-related needs will improve Outcome: Progressing   Problem: Nutrition: Goal: Adequate nutrition will be maintained Outcome: Progressing   Problem: Elimination: Goal: Will not experience complications related to bowel motility Outcome: Progressing   

## 2024-10-17 NOTE — ED Provider Notes (Signed)
 " [place] [facility] AT [facility] Provider Note  CSN: [id number] Arrival date & time: 10/17/24 9788  Chief Complaint(s) Abdominal Pain  HPI Jaime Nixon is a 28 y.o. male here today for abdominal pain.  He reports that he has had abdominal pain over the last few weeks, has been seen in the ED 2 times without etiology identified.  States that he has been nauseated, has had vomiting.  He states he has not used marijuana for more than 1-1/2 months.   Past Medical History Past Medical History:  Diagnosis Date   Current smoker    History of substance use    Seizures (HCC)    Patient Active Problem List   Diagnosis Date Noted   Suicidal ideations 05/16/2023   MDD (major depressive disorder) 05/16/2023   GAD (generalized anxiety disorder) 05/16/2023   Traumatic brain injury (HCC) 05/16/2023   Intermittent explosive disorder in adult 05/16/2023   Facial cellulitis    Tooth infection 07/21/2022   Excessive use of nonsteroidal anti-inflammatory drugs (NSAIDs) 07/21/2022   Cannabis abuse    Severe episode of recurrent major depressive disorder, 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   Panic attacks 09/15/2016   Bradycardia    OD (overdose of drug), intentional self-harm, initial encounter (HCC) 09/10/2016   Bradycardia, drug induced 09/10/2016   Hypotension due to drugs 09/10/2016   Overdose 09/10/2016   Generalized abdominal pain    Hyperkalemia    Uncontrollable vomiting    Seizure disorder (HCC) 05/26/2016   Cannabinoid hyperemesis syndrome 05/26/2016   Hypokalemia due to loss of potassium 05/26/2016   Tobacco dependence 05/26/2016   Nausea and vomiting 05/26/2016   Leukocytosis 12/27/2015   Abnormal finding on MRI of brain    Pachymeningitis    Home Medication(s) Prior to Admission medications  Medication Sig Start Date End Date Taking? Authorizing Provider  acetaminophen  (TYLENOL ) 500 MG tablet Take 4 tablets by mouth every 6 (six) hours as  needed for moderate pain (pain score 4-6) or mild pain (pain score 1-3).    [provider]  amoxicillin -clavulanate (AUGMENTIN ) 875-125 MG tablet Take 1 tablet by mouth 2 (two) times daily. 12/23/23   Christopher Savannah, PA-C  ibuprofen  (ADVIL ) 800 MG tablet Take 800 mg by mouth every 6 (six) hours as needed. 10/03/23   [provider]  naproxen  (NAPROSYN ) 500 MG tablet Take 1 tablet (500 mg total) by mouth 2 (two) times daily with a meal. 12/23/23   Christopher Savannah, PA-C  omeprazole  (PRILOSEC) 20 MG capsule Take 1 capsule (20 mg total) by mouth daily. 10/15/24 11/14/24  Robinson, John K, PA-C  sucralfate  (CARAFATE ) 1 g tablet Take 1 tablet (1 g total) by mouth 3 (three) times daily as needed. 10/15/24 10/25/24  Robinson, John K, PA-C  levETIRAcetam  (KEPPRA ) 500 MG tablet Take 1 tablet (500 mg total) by mouth 2 (two) times daily. 12/13/18 02/07/20  Harris, Abigail, PA-C  Past Surgical History History reviewed. No pertinent surgical history. Family History Family History  Problem Relation Age of Onset   Seizures Father    Seizures Brother     Social History Social History[1] Allergies Dexamethasone  and Prednisone  Review of Systems Review of Systems  Physical Exam Vital Signs  I have reviewed the triage vital signs BP 121/69   Pulse 74   Temp 98.7 F (37.1 C)   Resp 18   SpO2 97%   Physical Exam Vitals and nursing note reviewed.  Cardiovascular:     Rate and Rhythm: Normal rate.  Pulmonary:     Effort: Pulmonary effort is normal.  Abdominal:     General: Abdomen is flat.     Palpations: Abdomen is soft.     Tenderness: There is no abdominal tenderness. There is no guarding or rebound. Negative signs include Murphy's sign, McBurney's sign and obturator sign.  Skin:    General: Skin is warm.  Neurological:     Mental Status: He is alert.      ED Results and Treatments Labs (all labs ordered are listed, but only abnormal results are displayed) Labs Reviewed  COMPREHENSIVE METABOLIC PANEL WITH GFR - Abnormal; Notable for the following components:      Result Value   Chloride 97 (*)    CO2 18 (*)    Glucose, Bld 153 (*)    BUN 35 (*)    Creatinine, Ser 1.33 (*)    Calcium 10.9 (*)    Total Protein 9.2 (*)    Albumin 5.5 (*)    AST 241 (*)    ALT 101 (*)    Anion gap 22 (*)    All other components within normal limits  CBC WITH DIFFERENTIAL/PLATELET - Abnormal; Notable for the following components:   WBC 14.4 (*)    RBC 5.93 (*)    Hemoglobin 17.7 (*)    Neutro Abs 11.5 (*)    Monocytes Absolute 1.3 (*)    All other components within normal limits  URINE DRUG SCREEN - Abnormal; Notable for the following components:   Tetrahydrocannabinol POSITIVE (*)    All other components within normal limits  URINALYSIS, ROUTINE W REFLEX MICROSCOPIC - Abnormal; Notable for the following components:   Color, Urine AMBER (*)    Specific Gravity, Urine >1.046 (*)    Protein, ur 100 (*)    All other components within normal limits  ACETAMINOPHEN  LEVEL - Abnormal; Notable for the following components:   Acetaminophen  (Tylenol ), Serum <10 (*)    All other components within normal limits  LIPASE, BLOOD  ETHANOL  HEPATITIS PANEL, ACUTE                                                                                                                          Radiology CT Angio Abd/Pel W and/or Wo Contrast Result Date: 10/17/2024 EXAM: CTA ABDOMEN AND PELVIS WITHOUT AND WITH CONTRAST 10/17/2024 03:18:46 AM TECHNIQUE: CTA images of the  abdomen and pelvis without and with intravenous contrast. 100 mL (iohexol  (OMNIPAQUE ) 350 MG/ML injection 100 mL IOHEXOL  350 MG/ML SOLN) was administered. Three-dimensional MIP/volume rendered formations were performed. Automated exposure control, iterative reconstruction, and/or weight based adjustment  of the mA/kV was utilized to reduce the radiation dose to as low as reasonably achievable. COMPARISON: 10/14/2024 CLINICAL HISTORY: Lower GI bleed FINDINGS: VASCULATURE: No acute finding. AORTA: No acute finding. No abdominal aortic aneurysm. No dissection. CELIAC TRUNK: No acute finding. No occlusion or significant stenosis. SUPERIOR MESENTERIC ARTERY: No acute finding. No occlusion or significant stenosis. RENAL ARTERIES: No acute finding. No occlusion or significant stenosis. ILIAC ARTERIES: No acute finding. No occlusion or significant stenosis. LIVER: The liver is unremarkable. GALLBLADDER AND BILE DUCTS: Gallbladder is unremarkable. No biliary ductal dilatation. SPLEEN: The spleen is unremarkable. PANCREAS: The pancreas is unremarkable. ADRENAL GLANDS: Bilateral adrenal glands demonstrate no acute abnormality. KIDNEYS, URETERS AND BLADDER: No stones in the kidneys or ureters. No hydronephrosis. No perinephric or periureteral stranding. Urinary bladder is unremarkable. GI AND BOWEL: The appendix is within normal limits. No findings to suggest acute GI hemorrhage are noted. No obstructive changes of the colon are seen. The stomach and small bowel are within normal limits. There is no bowel obstruction. No abnormal bowel wall thickening or distension. REPRODUCTIVE: The prostate is within normal limits. PERITONEUM AND RETRPERITONEUM: No ascites or free air. LUNG BASE: No acute abnormality. LYMPH NODES: No lymphadenopathy. BONES AND SOFT TISSUES: No acute abnormality of the bones. No acute soft tissue abnormality. IMPRESSION: 1. No findings to suggest acute GI hemorrhage. Electronically signed by: Oneil Devonshire MD 10/17/2024 03:27 AM EST RP Workstation: [id number]    Pertinent labs & imaging results that were available during my care of the patient were reviewed by me and considered in my medical decision making (see MDM for details).  Medications Ordered in ED Medications  lactated ringers  infusion (  Intravenous New Bag/Given 10/17/24 0601)  droperidol  (INAPSINE ) 2.5 MG/ML injection 2.5 mg (2.5 mg Intravenous Given 10/17/24 0256)  sodium chloride  0.9 % bolus 1,000 mL (0 mLs Intravenous Stopped 10/17/24 0438)  alum & mag hydroxide-simeth (MAALOX/MYLANTA) 200-200-20 MG/5ML suspension 15 mL (15 mLs Oral Given 10/17/24 0252)  pantoprazole  (PROTONIX ) injection 40 mg (40 mg Intravenous Given 10/17/24 0258)  iohexol  (OMNIPAQUE ) 350 MG/ML injection 100 mL (100 mLs Intravenous Contrast Given 10/17/24 0312)  ondansetron  (ZOFRAN ) injection 4 mg (4 mg Intravenous Given 10/17/24 0348)  capsaicin  (ZOSTRIX) 0.025 % cream ( Topical Given 10/17/24 0406)  lactated ringers  bolus 1,000 mL (0 mLs Intravenous Stopped 10/17/24 0600)  lactated ringers  bolus 1,000 mL (1,000 mLs Intravenous New Bag/Given 10/17/24 0601)                                                                                                                                     Procedures Procedures  (including critical care time)  Medical Decision Making / ED Course  This patient presents to the ED for concern of abdominal pain, this involves an extensive number of treatment options, and is a complaint that carries with it a high risk of complications and morbidity.  The differential diagnosis includes gastritis, enteritis, pancreatitis, cannabinoid hyperemesis, chronic abdominal pain, consider dissection, consider intra-abdominal infection, consider peptic ulcer disease.  MDM: Patient with reassuring vital signs.  He is uncomfortable appearing.  His abdomen is soft, do not elicit any significant tenderness on my exam.  Reviewed the patient's recent imaging, will perform an angiography of the abdomen as this may better show peptic ulcer disease.  Will provide the patient with some droperidol .  His UDS 2 days ago did show some THC.  I discussed this with the patient, he did not deny more recent use.  Reassessment 6:10 AM-patient's blood  work does show pretty good rise in his LFTs, steadily increasing creatinine.  Do believe the patient is quite dehydrated as evidenced by his specific gravity on his urine.  Will continue to fluid resuscitate the patient.  He is still experiencing some nausea.  I believe he will require continued fluid resuscitation in the setting of his AKI, transaminitis and improvement in his symptoms so that he can consistently tolerate p.o.  Will admit to hospitalist service.  Additional history obtained: - -External records from outside source obtained and reviewed including: Chart review including previous notes, labs, imaging, consultation notes   Lab Tests: -I ordered, reviewed, and interpreted labs.   The pertinent results include:   Labs Reviewed  COMPREHENSIVE METABOLIC PANEL WITH GFR - Abnormal; Notable for the following components:      Result Value   Chloride 97 (*)    CO2 18 (*)    Glucose, Bld 153 (*)    BUN 35 (*)    Creatinine, Ser 1.33 (*)    Calcium 10.9 (*)    Total Protein 9.2 (*)    Albumin 5.5 (*)    AST 241 (*)    ALT 101 (*)    Anion gap 22 (*)    All other components within normal limits  CBC WITH DIFFERENTIAL/PLATELET - Abnormal; Notable for the following components:   WBC 14.4 (*)    RBC 5.93 (*)    Hemoglobin 17.7 (*)    Neutro Abs 11.5 (*)    Monocytes Absolute 1.3 (*)    All other components within normal limits  URINE DRUG SCREEN - Abnormal; Notable for the following components:   Tetrahydrocannabinol POSITIVE (*)    All other components within normal limits  URINALYSIS, ROUTINE W REFLEX MICROSCOPIC - Abnormal; Notable for the following components:   Color, Urine AMBER (*)    Specific Gravity, Urine >1.046 (*)    Protein, ur 100 (*)    All other components within normal limits  ACETAMINOPHEN  LEVEL - Abnormal; Notable for the following components:   Acetaminophen  (Tylenol ), Serum <10 (*)    All other components within normal limits  LIPASE, BLOOD  ETHANOL   HEPATITIS PANEL, ACUTE     Imaging Studies ordered: I ordered imaging studies including CTA of the abdomen and pelvis I independently visualized and interpreted imaging. I agree with the radiologist interpretation   Medicines ordered and prescription drug management: Meds ordered this encounter  Medications   droperidol  (INAPSINE ) 2.5 MG/ML injection 2.5 mg   sodium chloride  0.9 % bolus 1,000 mL   alum & mag hydroxide-simeth (MAALOX/MYLANTA) 200-200-20 MG/5ML suspension 15 mL   pantoprazole  (PROTONIX ) injection 40 mg   iohexol  (OMNIPAQUE )  350 MG/ML injection 100 mL   ondansetron  (ZOFRAN ) injection 4 mg   capsaicin  (ZOSTRIX) 0.025 % cream   lactated ringers  bolus 1,000 mL   lactated ringers  bolus 1,000 mL   lactated ringers  infusion    -I have reviewed the patients home medicines and have made adjustments as needed  Cardiac Monitoring: The patient was maintained on a cardiac monitor.  I personally viewed and interpreted the cardiac monitored which showed an underlying rhythm of: Normal sinus rhythm  Social Determinants of Health:  Factors impacting patients care include: Lack of access to primary care   Reevaluation: After the interventions noted above, I reevaluated the patient and found that they have :improved  Co morbidities that complicate the patient evaluation  Past Medical History:  Diagnosis Date   Current smoker    History of substance use    Seizures (HCC)       Final Clinical Impression(s) / ED Diagnoses Final diagnoses:  Transaminitis  Acute kidney injury  Cannabinoid hyperemesis syndrome     @PCDICTATION @      [1]  Social History Tobacco Use   Smoking status: Every Day    Current packs/day: 1.00    Average packs/day: 1 pack/day for 10.0 years (10.0 ttl pk-yrs)    Types: Cigarettes   Smokeless tobacco: Never  Vaping Use   Vaping status: Never Used  Substance Use Topics   Alcohol use: Not Currently   Drug use: Not Currently    Types:  Marijuana     Mannie Pac T, DO 10/17/24 [id number]  "

## 2024-10-17 NOTE — ED Triage Notes (Signed)
 Arrives [facility] from home with ongoing generalized abdominal pain unknown etiology.   Has Carafate  and Omeprazole  but says they are not working.   Appears in great pain.

## 2024-10-18 LAB — COMPREHENSIVE METABOLIC PANEL WITH GFR
ALT: 96 U/L — ABNORMAL HIGH (ref 0–44)
AST: 120 U/L — ABNORMAL HIGH (ref 15–41)
Albumin: 4.4 g/dL (ref 3.5–5.0)
Alkaline Phosphatase: 80 U/L (ref 38–126)
Anion gap: 13 (ref 5–15)
BUN: 19 mg/dL (ref 6–20)
CO2: 24 mmol/L (ref 22–32)
Calcium: 9.6 mg/dL (ref 8.9–10.3)
Chloride: 102 mmol/L (ref 98–111)
Creatinine, Ser: 0.75 mg/dL (ref 0.61–1.24)
GFR, Estimated: >60 mL/min
Glucose, Bld: 109 mg/dL — ABNORMAL HIGH (ref 70–99)
Potassium: 4.3 mmol/L (ref 3.5–5.1)
Sodium: 138 mmol/L (ref 135–145)
Total Bilirubin: 0.6 mg/dL (ref 0.0–1.2)
Total Protein: 7.1 g/dL (ref 6.5–8.1)

## 2024-10-18 LAB — CBC
HCT: 42.2 % (ref 39.0–52.0)
Hemoglobin: 14.4 g/dL (ref 13.0–17.0)
MCH: 29.9 pg (ref 26.0–34.0)
MCHC: 34.1 g/dL (ref 30.0–36.0)
MCV: 87.6 fL (ref 80.0–100.0)
Platelets: 203 K/uL (ref 150–400)
RBC: 4.82 MIL/uL (ref 4.22–5.81)
RDW: 13.1 % (ref 11.5–15.5)
WBC: 8.5 K/uL (ref 4.0–10.5)
nRBC: 0 % (ref 0.0–0.2)

## 2024-10-18 LAB — HIV ANTIBODY (ROUTINE TESTING W REFLEX): HIV Screen 4th Generation wRfx: NONREACTIVE

## 2024-10-18 MED ORDER — LORAZEPAM 2 MG/ML IJ SOLN
0.5000 mg | Freq: Once | INTRAMUSCULAR | Status: AC
  Administered 2024-10-18: 0.5 mg via INTRAVENOUS
  Filled 2024-10-18: qty 1

## 2024-10-18 MED ORDER — PROCHLORPERAZINE EDISYLATE 10 MG/2ML IJ SOLN
10.0000 mg | Freq: Four times a day (QID) | INTRAMUSCULAR | Status: DC | PRN
  Administered 2024-10-18 (×3): 10 mg via INTRAVENOUS
  Filled 2024-10-18 (×3): qty 2

## 2024-10-18 MED ORDER — BISACODYL 10 MG RE SUPP
10.0000 mg | Freq: Every day | RECTAL | Status: DC | PRN
  Administered 2024-10-18: 10 mg via RECTAL
  Filled 2024-10-18: qty 1

## 2024-10-18 MED ORDER — BISACODYL 5 MG PO TBEC
10.0000 mg | DELAYED_RELEASE_TABLET | Freq: Once | ORAL | Status: AC
  Administered 2024-10-18: 10 mg via ORAL
  Filled 2024-10-18: qty 2

## 2024-10-18 MED ORDER — LACTULOSE 10 GM/15ML PO SOLN
30.0000 g | Freq: Once | ORAL | Status: AC
  Administered 2024-10-18: 30 g via ORAL
  Filled 2024-10-18: qty 45

## 2024-10-18 NOTE — Hospital Course (Addendum)
 Brief Narrative:  28 year old male with past medical history of TBI, seizure, marijuana use admitted to the hospital for intractable abdominal pain nausea and vomiting.  Upon admission CT abdomen pelvis does not show any acute finding, chest x-ray is unremarkable.  Admitted with concerns of cannabis induced hyperemesis/cyclical vomiting.  During the hospitalization lipase were normal, LFTs trended down.  CT abdomen pelvis was unremarkable.  Overall he was feeling well with some nausea and vomiting.  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 - IV fluids, advance diet as tolerated - Counseled to quit using marijuana -Lipase is normal, LFTs are trending downwards   DVT prophylaxis: enoxaparin  (LOVENOX ) injection 40 mg Start: 10/17/24 1000      Code Status: Full Code Family Communication:   Discharge once he is able to tolerate p.o.  Hopefully next 24 hours  PT Follow up Recs:   Subjective: Having some nausea but really wishing to go home today as he understands this is cyclical in nature and nothing much can be done in the hospital.  He is able to tolerate some p.o. food and medications.  Examination:  General exam: Appears calm and comfortable  Respiratory system: Clear to auscultation. Respiratory effort normal. Cardiovascular system: S1 & S2 heard, RRR. No JVD, murmurs, rubs, gallops or clicks. No pedal edema. Gastrointestinal system: Abdomen is nondistended, soft and nontender. No organomegaly or masses felt. Normal bowel sounds heard. Central nervous system: Alert and oriented. No focal neurological deficits. Extremities: Symmetric 5 x 5 power. Skin: No rashes, lesions or ulcers Psychiatry: Judgement and insight appear normal. Mood & affect appropriate.

## 2024-10-18 NOTE — Progress Notes (Signed)
 Pt continued to have multiple emesis episodes was unable to stay asleep. Pt became very frustrated punching the couch and yelling. Wife advised this RN after word that his agitation is rising and stated  he has not had sleep in 3 weeks, he is getting more agitated and has a history of violent outbursts, can you please give him something to knock him out. Pt also asking for more Nausea meds after refusing to put tele on, educated importance of tele. Informed pt we need a Qtc for more meds. He agreed to wear but needed another shower shortly after replacing box. This RN requested Ativan . Ativan  has given him relief pt resting comfortably since med administration.

## 2024-10-18 NOTE — Progress Notes (Signed)
 " PROGRESS NOTE    CHARLY HOLCOMB  FMW:[id number] DOB: [birth year] DOA: 10/17/2024 PCP: Patient, No Pcp Per    Brief Narrative:  28 year old male with past medical history of TBI, seizure, marijuana use admitted to the hospital for intractable abdominal pain nausea and vomiting.  Upon admission CT abdomen pelvis does not show any acute finding, chest x-ray is unremarkable.  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 - IV fluids, advance diet as tolerated - Counseled to quit using marijuana -Lipase is normal, LFTs are trending downwards   DVT prophylaxis: enoxaparin  (LOVENOX ) injection 40 mg Start: 10/17/24 1000      Code Status: Full Code Family Communication:   Discharge once he is able to tolerate p.o.  Hopefully next 24 hours  PT Follow up Recs:   Subjective: Reports of severely being constipated.  He is going to try something oral later today and see if he tolerates it well After tried taking oral pills he did throw up this morning  Examination:  General exam: Appears calm and comfortable  Respiratory system: Clear to auscultation. Respiratory effort normal. Cardiovascular system: S1 & S2 heard, RRR. No JVD, murmurs, rubs, gallops or clicks. No pedal edema. Gastrointestinal system: Abdomen is nondistended, soft and nontender. No organomegaly or masses felt. Normal bowel sounds heard. Central nervous system: Alert and oriented. No focal neurological deficits. Extremities: Symmetric 5 x 5 power. Skin: No rashes, lesions or ulcers Psychiatry: Judgement and insight appear normal. Mood & affect appropriate.                Diet Orders (From admission, onward)     Start     Ordered   10/17/24 0902  Diet regular Room service appropriate? Yes; Fluid consistency: Thin  Diet  effective now       Question Answer Comment  Room service appropriate? Yes   Fluid consistency: Thin      10/17/24 0901            Objective: Vitals:   10/17/24 1300 10/17/24 1737 10/17/24 2130 10/18/24 0702  BP: 130/84 137/78 132/74 129/78  Pulse: 78 87 70 74  Resp: 20 20 18 18   Temp: 97.8 F (36.6 C) 98.9 F (37.2 C) 98.2 F (36.8 C) (!) 97.4 F (36.3 C)  TempSrc: Oral Oral Oral Oral  SpO2: 100% 99% 100% 100%    Intake/Output Summary (Last 24 hours) at 10/18/2024 1010 Last data filed at 10/17/2024 1300 Gross per 24 hour  Intake 100 ml  Output --  Net 100 ml   There were no vitals filed for this visit.  Scheduled Meds:  enoxaparin  (LOVENOX ) injection  40 mg Subcutaneous Q24H   nicotine   14 mg Transdermal Daily   pantoprazole  (PROTONIX ) IV  40 mg Intravenous Q24H   senna-docusate  1 tablet Oral BID   Continuous Infusions:  lactated ringers  125 mL/hr at 10/17/24 0601    Nutritional status     There is no height or weight on file to calculate BMI.  Data Reviewed:   CBC: Recent Labs  Lab 10/14/24 1640 10/15/24 1004 10/16/24 1921 10/17/24 0253 10/18/24 0446  WBC 9.1 11.6* 17.1* 14.4* 8.5  NEUTROABS  --   --   --  11.5*  --   HGB 15.8 16.3 17.4* 17.7* 14.4  HCT 45.6 46.1 48.8 50.1 42.2  MCV 87.0 86.3 84.3  84.5 87.6  PLT 218 224 287 317 203   Basic Metabolic Panel: Recent Labs  Lab 10/15/24 1004 10/16/24 1921 10/17/24 0253 10/17/24 1512 10/18/24 0446  NA 139 138 137 139 138  K 3.8 3.6 4.3 4.6 4.3  CL 103 99 97* 100 102  CO2 19* 18* 18* 26 24  GLUCOSE 155* 135* 153* 124* 109*  BUN 14 27* 35* 21* 19  CREATININE 0.81 1.27* 1.33* 0.89 0.75  CALCIUM 9.8 11.1* 10.9* 10.1 9.6   GFR: Estimated Creatinine Clearance: 148.2 mL/min (by C-G formula based on SCr of 0.75 mg/dL). Liver Function Tests: Recent Labs  Lab 10/15/24 1004 10/16/24 1921 10/17/24 0253 10/17/24 1512 10/18/24 0446  AST 37 98* 241* 194* 120*  ALT 9 39 101* 119* 96*   ALKPHOS 93 98 104 93 80  BILITOT 0.4 0.7 0.8 0.6 0.6  PROT 7.9 8.9* 9.2* 8.2* 7.1  ALBUMIN 5.0 5.4* 5.5* 4.8 4.4   Recent Labs  Lab 10/14/24 1640 10/15/24 1004 10/16/24 1921 10/17/24 0253  LIPASE 25 16 17 15    No results for input(s): AMMONIA in the last 168 hours. Coagulation Profile: No results for input(s): INR, PROTIME in the last 168 hours. Cardiac Enzymes: No results for input(s): CKTOTAL, CKMB, CKMBINDEX, TROPONINI in the last 168 hours. BNP (last 3 results) No results for input(s): PROBNP in the last 8760 hours. HbA1C: No results for input(s): HGBA1C in the last 72 hours. CBG: Recent Labs  Lab 10/17/24 0908  GLUCAP 130*   Lipid Profile: No results for input(s): CHOL, HDL, LDLCALC, TRIG, CHOLHDL, LDLDIRECT in the last 72 hours. Thyroid  Function Tests: No results for input(s): TSH, T4TOTAL, FREET4, T3FREE, THYROIDAB in the last 72 hours. Anemia Panel: No results for input(s): VITAMINB12, FOLATE, FERRITIN, TIBC, IRON, RETICCTPCT in the last 72 hours. Sepsis Labs: No results for input(s): PROCALCITON, LATICACIDVEN in the last 168 hours.  No results found for this or any previous visit (from the past 240 hours).       Radiology Studies: CT Angio Abd/Pel W and/or Wo Contrast Result Date: 10/17/2024 EXAM: CTA ABDOMEN AND PELVIS WITHOUT AND WITH CONTRAST 10/17/2024 03:18:46 AM TECHNIQUE: CTA images of the abdomen and pelvis without and with intravenous contrast. 100 mL (iohexol  (OMNIPAQUE ) 350 MG/ML injection 100 mL IOHEXOL  350 MG/ML SOLN) was administered. Three-dimensional MIP/volume rendered formations were performed. Automated exposure control, iterative reconstruction, and/or weight based adjustment of the mA/kV was utilized to reduce the radiation dose to as low as reasonably achievable. COMPARISON: 10/14/2024 CLINICAL HISTORY: Lower GI bleed FINDINGS: VASCULATURE: No acute finding. AORTA: No acute finding.  No abdominal aortic aneurysm. No dissection. CELIAC TRUNK: No acute finding. No occlusion or significant stenosis. SUPERIOR MESENTERIC ARTERY: No acute finding. No occlusion or significant stenosis. RENAL ARTERIES: No acute finding. No occlusion or significant stenosis. ILIAC ARTERIES: No acute finding. No occlusion or significant stenosis. LIVER: The liver is unremarkable. GALLBLADDER AND BILE DUCTS: Gallbladder is unremarkable. No biliary ductal dilatation. SPLEEN: The spleen is unremarkable. PANCREAS: The pancreas is unremarkable. ADRENAL GLANDS: Bilateral adrenal glands demonstrate no acute abnormality. KIDNEYS, URETERS AND BLADDER: No stones in the kidneys or ureters. No hydronephrosis. No perinephric or periureteral stranding. Urinary bladder is unremarkable. GI AND BOWEL: The appendix is within normal limits. No findings to suggest acute GI hemorrhage are noted. No obstructive changes of the colon are seen. The stomach and small bowel are within normal limits. There is no bowel obstruction. No abnormal bowel wall thickening or distension. REPRODUCTIVE: The prostate is within normal  limits. PERITONEUM AND RETRPERITONEUM: No ascites or free air. LUNG BASE: No acute abnormality. LYMPH NODES: No lymphadenopathy. BONES AND SOFT TISSUES: No acute abnormality of the bones. No acute soft tissue abnormality. IMPRESSION: 1. No findings to suggest acute GI hemorrhage. Electronically signed by: Oneil Devonshire MD 10/17/2024 03:27 AM EST RP Workstation: [id number]           LOS: 0 days   Time spent= 35 mins    Burgess JAYSON Dare, MD [facility]  If 7PM-7AM, please contact night-coverage  10/18/2024, 10:10 AM  "

## 2024-10-18 NOTE — Plan of Care (Signed)
" °  Problem: Health Behavior/Discharge Planning: Goal: Ability to manage health-related needs will improve Outcome: Progressing   Problem: Nutrition: Goal: Adequate nutrition will be maintained Outcome: Not Progressing   Problem: Elimination: Goal: Will not experience complications related to bowel motility Outcome: Not Progressing   "

## 2024-10-19 ENCOUNTER — Encounter (HOSPITAL_COMMUNITY): Payer: Self-pay | Admitting: Emergency Medicine

## 2024-10-19 ENCOUNTER — Other Ambulatory Visit (HOSPITAL_COMMUNITY): Payer: Self-pay

## 2024-10-19 ENCOUNTER — Emergency Department (HOSPITAL_COMMUNITY)
Admission: EM | Admit: 2024-10-19 | Discharge: 2024-10-19 | Discharge status: Against Medical Advice | Payer: Self-pay | Attending: Emergency Medicine | Admitting: Emergency Medicine

## 2024-10-19 ENCOUNTER — Other Ambulatory Visit: Payer: Self-pay

## 2024-10-19 DIAGNOSIS — Z5321 Procedure and treatment not carried out due to patient leaving prior to being seen by health care provider: Secondary | ICD-10-CM | POA: Insufficient documentation

## 2024-10-19 DIAGNOSIS — R569 Unspecified convulsions: Secondary | ICD-10-CM | POA: Insufficient documentation

## 2024-10-19 LAB — CBC
HCT: 45.6 % (ref 39.0–52.0)
Hemoglobin: 15.7 g/dL (ref 13.0–17.0)
MCH: 29.8 pg (ref 26.0–34.0)
MCHC: 34.4 g/dL (ref 30.0–36.0)
MCV: 86.7 fL (ref 80.0–100.0)
Platelets: 245 K/uL (ref 150–400)
RBC: 5.26 MIL/uL (ref 4.22–5.81)
RDW: 12.5 % (ref 11.5–15.5)
WBC: 16.1 K/uL — ABNORMAL HIGH (ref 4.0–10.5)
nRBC: 0 % (ref 0.0–0.2)

## 2024-10-19 LAB — COMPREHENSIVE METABOLIC PANEL WITH GFR
ALT: 88 U/L — ABNORMAL HIGH (ref 0–44)
AST: 73 U/L — ABNORMAL HIGH (ref 15–41)
Albumin: 4.6 g/dL (ref 3.5–5.0)
Alkaline Phosphatase: 94 U/L (ref 38–126)
Anion gap: 12 (ref 5–15)
BUN: 15 mg/dL (ref 6–20)
CO2: 23 mmol/L (ref 22–32)
Calcium: 9.7 mg/dL (ref 8.9–10.3)
Chloride: 101 mmol/L (ref 98–111)
Creatinine, Ser: 0.75 mg/dL (ref 0.61–1.24)
GFR, Estimated: >60 mL/min
Glucose, Bld: 118 mg/dL — ABNORMAL HIGH (ref 70–99)
Potassium: 3.5 mmol/L (ref 3.5–5.1)
Sodium: 136 mmol/L (ref 135–145)
Total Bilirubin: 0.8 mg/dL (ref 0.0–1.2)
Total Protein: 7.6 g/dL (ref 6.5–8.1)

## 2024-10-19 LAB — CBG MONITORING, ED: Glucose-Capillary: 112 mg/dL — ABNORMAL HIGH (ref 70–99)

## 2024-10-19 MED ORDER — ONDANSETRON 4 MG PO TBDP
4.0000 mg | ORAL_TABLET | Freq: Three times a day (TID) | ORAL | 0 refills | Status: DC | PRN
  Filled 2024-10-19: qty 30, 10d supply, fill #0

## 2024-10-19 MED ORDER — HYDROXYZINE HCL 10 MG PO TABS
10.0000 mg | ORAL_TABLET | Freq: Once | ORAL | Status: AC | PRN
  Administered 2024-10-19: 10 mg via ORAL
  Filled 2024-10-19: qty 1

## 2024-10-19 MED ORDER — PROCHLORPERAZINE MALEATE 10 MG PO TABS
10.0000 mg | ORAL_TABLET | Freq: Four times a day (QID) | ORAL | 0 refills | Status: DC | PRN
  Filled 2024-10-19: qty 30, 8d supply, fill #0

## 2024-10-19 NOTE — Progress Notes (Signed)
 Discharge mediations delivered to the bedside

## 2024-10-19 NOTE — Discharge Summary (Signed)
 Physician Discharge Summary  Jaime Nixon FMW:[id number] DOB: 05-Apr-1996 DOA: 10/17/2024  PCP: Patient, No Pcp Per  Admit date: 10/17/2024 Discharge date: 10/19/2024  Admitted From: Home Disposition: Home  Recommendations for Outpatient Follow-up:  Follow up with PCP in 1-2 weeks Please obtain BMP/CBC in one week your next doctors visit.  Antiemetics as needed prescribed   Discharge Condition: Stable CODE STATUS: Full code Diet recommendation: Regular  Brief/Interim Summary: Brief Narrative:  28 year old male with past medical history of TBI, seizure, marijuana use admitted to the hospital for intractable abdominal pain nausea and vomiting.  Upon admission CT abdomen pelvis does not show any acute finding, chest x-ray is unremarkable.  Admitted with concerns of cannabis induced hyperemesis/cyclical vomiting.  During the hospitalization lipase were normal, LFTs trended down.  CT abdomen pelvis was unremarkable.  Overall he was feeling well with some nausea and vomiting.  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 - IV fluids, advance diet as tolerated - Counseled to quit using marijuana -Lipase is normal, LFTs are trending downwards   DVT prophylaxis: enoxaparin  (LOVENOX ) injection 40 mg Start: 10/17/24 1000      Code Status: Full Code Family Communication:   Discharge once he is able to tolerate p.o.  Hopefully next 24 hours  PT Follow up Recs:   Subjective: Having some nausea but really wishing to go home today as he understands this is cyclical in nature and nothing much can be done in the hospital.  He is able to tolerate some p.o. food and medications.  Examination:  General exam: Appears calm and comfortable  Respiratory system: Clear to  auscultation. Respiratory effort normal. Cardiovascular system: S1 & S2 heard, RRR. No JVD, murmurs, rubs, gallops or clicks. No pedal edema. Gastrointestinal system: Abdomen is nondistended, soft and nontender. No organomegaly or masses felt. Normal bowel sounds heard. Central nervous system: Alert and oriented. No focal neurological deficits. Extremities: Symmetric 5 x 5 power. Skin: No rashes, lesions or ulcers Psychiatry: Judgement and insight appear normal. Mood & affect appropriate.    Discharge Diagnoses:  Principal Problem:   Intractable nausea and vomiting      Discharge Exam: Vitals:   10/18/24 2328 10/19/24 0906  BP: 132/86 (!) 143/90  Pulse: 85 82  Resp: 18 18  Temp: 98.1 F (36.7 C) 98.5 F (36.9 C)  SpO2: 100% 100%   Vitals:   10/18/24 0702 10/18/24 1238 10/18/24 2328 10/19/24 0906  BP: 129/78 128/73 132/86 (!) 143/90  Pulse: 74 (!) 102 85 82  Resp: 18 17 18 18   Temp: (!) 97.4 F (36.3 C) 98.3 F (36.8 C) 98.1 F (36.7 C) 98.5 F (36.9 C)  TempSrc: Oral   Oral  SpO2: 100% 100% 100% 100%      Discharge Instructions   Allergies as of 10/19/2024       Reactions   Dexamethasone  Shortness Of Breath, Other (See Comments)   Makes pt angry    Prednisone Other (See Comments)   Makes pt angry         Medication List     STOP taking these medications    amoxicillin -clavulanate 875-125 MG tablet Commonly known as: AUGMENTIN    ibuprofen  800 MG tablet Commonly known as: ADVIL    naproxen  500 MG tablet Commonly known as: NAPROSYN   TAKE these medications    acetaminophen  500 MG tablet Commonly known as: TYLENOL  Take 4 tablets by mouth every 6 (six) hours as needed for moderate pain (pain score 4-6) or mild pain (pain score 1-3).   omeprazole  20 MG capsule Commonly known as: PRILOSEC Take 1 capsule (20 mg total) by mouth daily.   ondansetron  4 MG disintegrating tablet Commonly known as: ZOFRAN -ODT Dissolve 1 tablet (4 mg total)  by mouth every 8 (eight) hours as needed for nausea or vomiting.   prochlorperazine  10 MG tablet Commonly known as: COMPAZINE  Take 1 tablet (10 mg total) by mouth every 6 (six) hours as needed for refractory nausea / vomiting.   sucralfate  1 g tablet Commonly known as: Carafate  Take 1 tablet (1 g total) by mouth 3 (three) times daily as needed. What changed: reasons to take this        Follow-up Information     [facility]. Go on 11/17/2024.   Why: You have a new patient appointment with Tinnie Harada on Wednesday 1//21/2026 at 3 PM. Please arrive by 2:45 PM. If a cancellation is unavoidable, please cancel your visit at least 24 hours in advance so that we may give this time to another patient. Contact information: Address: [address] [place], [place] [address]  Phone: [phone]               Allergies[1]  You were cared for by a hospitalist during your hospital stay. If you have any questions about your discharge medications or the care you received while you were in the hospital after you are discharged, you can call the unit and asked to speak with the hospitalist on call if the hospitalist that took care of you is not available. Once you are discharged, your primary care physician will handle any further medical issues. Please note that no refills for any discharge medications will be authorized once you are discharged, as it is imperative that you return to your primary care physician (or establish a relationship with a primary care physician if you do not have one) for your aftercare needs so that they can reassess your need for medications and monitor your lab values.  You were cared for by a hospitalist during your hospital stay. If you have any questions about your discharge medications or the care you received while you were in the hospital after you are discharged, you can call the unit and asked to speak with the  hospitalist on call if the hospitalist that took care of you is not available. Once you are discharged, your primary care physician will handle any further medical issues. Please note that NO REFILLS for any discharge medications will be authorized once you are discharged, as it is imperative that you return to your primary care physician (or establish a relationship with a primary care physician if you do not have one) for your aftercare needs so that they can reassess your need for medications and monitor your lab values.  Please request your Prim.MD to go over all Hospital Tests and Procedure/Radiological results at the follow up, please get all Hospital records sent to your Prim MD by signing hospital release before you go home.  Get CBC, CMP, 2 view Chest X ray checked  by Primary MD during your next visit or SNF MD in 5-7 days ( we routinely change or add medications that can affect your baseline labs and fluid status, therefore we recommend that you get  the mentioned basic workup next visit with your PCP, your PCP may decide not to get them or add new tests based on their clinical decision)  On your next visit with your primary care physician please Get Medicines reviewed and adjusted.  If you experience worsening of your admission symptoms, develop shortness of breath, life threatening emergency, suicidal or homicidal thoughts you must seek medical attention immediately by calling 911 or calling your MD immediately  if symptoms less severe.  You Must read complete instructions/literature along with all the possible adverse reactions/side effects for all the Medicines you take and that have been prescribed to you. Take any new Medicines after you have completely understood and accpet all the possible adverse reactions/side effects.   Do not drive, operate heavy machinery, perform activities at heights, swimming or participation in water activities or provide baby sitting services if your were  admitted for syncope or siezures until you have seen by Primary MD or a Neurologist and advised to do so again.  Do not drive when taking Pain medications.   Procedures/Studies: CT Angio Abd/Pel W and/or Wo Contrast Result Date: 10/17/2024 EXAM: CTA ABDOMEN AND PELVIS WITHOUT AND WITH CONTRAST 10/17/2024 03:18:46 AM TECHNIQUE: CTA images of the abdomen and pelvis without and with intravenous contrast. 100 mL (iohexol  (OMNIPAQUE ) 350 MG/ML injection 100 mL IOHEXOL  350 MG/ML SOLN) was administered. Three-dimensional MIP/volume rendered formations were performed. Automated exposure control, iterative reconstruction, and/or weight based adjustment of the mA/kV was utilized to reduce the radiation dose to as low as reasonably achievable. COMPARISON: 10/14/2024 CLINICAL HISTORY: Lower GI bleed FINDINGS: VASCULATURE: No acute finding. AORTA: No acute finding. No abdominal aortic aneurysm. No dissection. CELIAC TRUNK: No acute finding. No occlusion or significant stenosis. SUPERIOR MESENTERIC ARTERY: No acute finding. No occlusion or significant stenosis. RENAL ARTERIES: No acute finding. No occlusion or significant stenosis. ILIAC ARTERIES: No acute finding. No occlusion or significant stenosis. LIVER: The liver is unremarkable. GALLBLADDER AND BILE DUCTS: Gallbladder is unremarkable. No biliary ductal dilatation. SPLEEN: The spleen is unremarkable. PANCREAS: The pancreas is unremarkable. ADRENAL GLANDS: Bilateral adrenal glands demonstrate no acute abnormality. KIDNEYS, URETERS AND BLADDER: No stones in the kidneys or ureters. No hydronephrosis. No perinephric or periureteral stranding. Urinary bladder is unremarkable. GI AND BOWEL: The appendix is within normal limits. No findings to suggest acute GI hemorrhage are noted. No obstructive changes of the colon are seen. The stomach and small bowel are within normal limits. There is no bowel obstruction. No abnormal bowel wall thickening or distension. REPRODUCTIVE:  The prostate is within normal limits. PERITONEUM AND RETRPERITONEUM: No ascites or free air. LUNG BASE: No acute abnormality. LYMPH NODES: No lymphadenopathy. BONES AND SOFT TISSUES: No acute abnormality of the bones. No acute soft tissue abnormality. IMPRESSION: 1. No findings to suggest acute GI hemorrhage. Electronically signed by: Oneil Devonshire MD 10/17/2024 03:27 AM EST RP Workstation: [id number] Chest Port 1 View Result Date: 10/15/2024 CLINICAL DATA:  Constipation with abdominal pain, nausea and vomiting x3 weeks. EXAM: PORTABLE CHEST 1 VIEW COMPARISON:  May 25, 2018 FINDINGS: The heart size and mediastinal contours are within normal limits. Both lungs are clear. The visualized skeletal structures are unremarkable. IMPRESSION: No active disease. Electronically Signed   By: Suzen Dials M.D.   On: 10/15/2024 10:25   CT ABDOMEN PELVIS W CONTRAST Result Date: 10/14/2024 EXAM: CT ABDOMEN AND PELVIS WITH CONTRAST 10/14/2024 05:53:45 PM TECHNIQUE: CT of the abdomen and pelvis was performed with the administration of 100 mL  of iohexol  (OMNIPAQUE ) 300 MG/ML solution. Multiplanar reformatted images are provided for review. Automated exposure control, iterative reconstruction, and/or weight-based adjustment of the mA/kV was utilized to reduce the radiation dose to as low as reasonably achievable. COMPARISON: None available. CLINICAL HISTORY: Bowel obstruction suspected; abd pain, constipation, last BM 3 wks ago. FINDINGS: LOWER CHEST: No acute abnormality. LIVER: The liver is unremarkable. GALLBLADDER AND BILE DUCTS: Gallbladder is unremarkable. No biliary ductal dilatation. SPLEEN: No acute abnormality. PANCREAS: No acute abnormality. ADRENAL GLANDS: No acute abnormality. KIDNEYS, URETERS AND BLADDER: No stones in the kidneys or ureters. No hydronephrosis. No perinephric or periureteral stranding. Urinary bladder is unremarkable. GI AND BOWEL: Appendix normal. The stomach, small bowel, and large bowel  are otherwise unremarkable. There is no bowel obstruction. PERITONEUM AND RETROPERITONEUM: No ascites. No free air. VASCULATURE: Aorta is normal in caliber. LYMPH NODES: No lymphadenopathy. REPRODUCTIVE ORGANS: No acute abnormality. BONES AND SOFT TISSUES: No acute osseous abnormality. No focal soft tissue abnormality. IMPRESSION: 1. No acute findings. No evidence of bowel obstruction. Electronically signed by: Dorethia Molt MD 10/14/2024 06:31 PM EST RP Workstation: [id number]     The results of significant diagnostics from this hospitalization (including imaging, microbiology, ancillary and laboratory) are listed below for reference.     Microbiology: No results found for this or any previous visit (from the past 240 hours).   Labs: BNP (last 3 results) No results for input(s): BNP in the last 8760 hours. Basic Metabolic Panel: Recent Labs  Lab 10/15/24 1004 10/16/24 1921 10/17/24 0253 10/17/24 1512 10/18/24 0446  NA 139 138 137 139 138  K 3.8 3.6 4.3 4.6 4.3  CL 103 99 97* 100 102  CO2 19* 18* 18* 26 24  GLUCOSE 155* 135* 153* 124* 109*  BUN 14 27* 35* 21* 19  CREATININE 0.81 1.27* 1.33* 0.89 0.75  CALCIUM 9.8 11.1* 10.9* 10.1 9.6   Liver Function Tests: Recent Labs  Lab 10/15/24 1004 10/16/24 1921 10/17/24 0253 10/17/24 1512 10/18/24 0446  AST 37 98* 241* 194* 120*  ALT 9 39 101* 119* 96*  ALKPHOS 93 98 104 93 80  BILITOT 0.4 0.7 0.8 0.6 0.6  PROT 7.9 8.9* 9.2* 8.2* 7.1  ALBUMIN 5.0 5.4* 5.5* 4.8 4.4   Recent Labs  Lab 10/14/24 1640 10/15/24 1004 10/16/24 1921 10/17/24 0253  LIPASE 25 16 17 15    No results for input(s): AMMONIA in the last 168 hours. CBC: Recent Labs  Lab 10/14/24 1640 10/15/24 1004 10/16/24 1921 10/17/24 0253 10/18/24 0446  WBC 9.1 11.6* 17.1* 14.4* 8.5  NEUTROABS  --   --   --  11.5*  --   HGB 15.8 16.3 17.4* 17.7* 14.4  HCT 45.6 46.1 48.8 50.1 42.2  MCV 87.0 86.3 84.3 84.5 87.6  PLT 218 224 287 317 203   Cardiac  Enzymes: No results for input(s): CKTOTAL, CKMB, CKMBINDEX, TROPONINI in the last 168 hours. BNP: Invalid input(s): POCBNP CBG: Recent Labs  Lab 10/17/24 0908  GLUCAP 130*   D-Dimer No results for input(s): DDIMER in the last 72 hours. Hgb A1c No results for input(s): HGBA1C in the last 72 hours. Lipid Profile No results for input(s): CHOL, HDL, LDLCALC, TRIG, CHOLHDL, LDLDIRECT in the last 72 hours. Thyroid  function studies No results for input(s): TSH, T4TOTAL, T3FREE, THYROIDAB in the last 72 hours.  Invalid input(s): FREET3 Anemia work up No results for input(s): VITAMINB12, FOLATE, FERRITIN, TIBC, IRON, RETICCTPCT in the last 72 hours. Urinalysis    Component Value Date/Time  COLORURINE AMBER (A) 10/17/2024 0344   APPEARANCEUR CLEAR 10/17/2024 0344   LABSPEC >1.046 (H) 10/17/2024 0344   PHURINE 5.0 10/17/2024 0344   GLUCOSEU NEGATIVE 10/17/2024 0344   HGBUR NEGATIVE 10/17/2024 0344   BILIRUBINUR NEGATIVE 10/17/2024 0344   KETONESUR NEGATIVE 10/17/2024 0344   PROTEINUR 100 (A) 10/17/2024 0344   UROBILINOGEN 0.2 06/01/2015 0503   NITRITE NEGATIVE 10/17/2024 0344   LEUKOCYTESUR NEGATIVE 10/17/2024 0344   Sepsis Labs Recent Labs  Lab 10/15/24 1004 10/16/24 1921 10/17/24 0253 10/18/24 0446  WBC 11.6* 17.1* 14.4* 8.5   Microbiology No results found for this or any previous visit (from the past 240 hours).   Time coordinating discharge:  I have spent 35 minutes face to face with the patient and on the ward discussing the patients care, assessment, plan and disposition with other care givers. >50% of the time was devoted counseling the patient about the risks and benefits of treatment/Discharge disposition and coordinating care.   SIGNED:   Burgess JAYSON Dare, MD  [facility] 10/19/2024, 2:08 PM   If 7PM-7AM, please contact night-coverage      [1]  Allergies Allergen Reactions   Dexamethasone   Shortness Of Breath and Other (See Comments)    Makes pt angry    Prednisone Other (See Comments)    Makes pt angry

## 2024-10-19 NOTE — ED Triage Notes (Signed)
 Pt arrives w/ [facility] w/ c/o 4 seizures today. Hx same. Doesn't take seizure meds due to pricing. Hasn't had seizure in 3 yrs. Last seizure 1 hr ago. A&O x 4 currently.

## 2024-10-19 NOTE — TOC Transition Note (Signed)
 Transition of Care [facility]) - Discharge Note   Patient Details  Name: Jaime Nixon MRN: [id number] Date of Birth: [birth year]  [facility] CM/SW Contact:  Heather DELENA Saltness, LCSW Phone Number: 10/19/2024, 10:32 AM   Clinical Narrative:    Pt currently uninsured with no PCP listed in the chart. CSW spoke with pt regarding establishing PCP services. Pt agreeable to new patient appointment with PCP. Pt denies preference of day/time of appointment. CSW scheduled appointment with Tinnie Harada on Wednesday 1//21/2026 at 3 PM at [facility] at [facility]. Appointment information added to AVS. Pt's spouse to transport pt home upon discharge.   Final next level of care: Home/Self Care Barriers to Discharge: Barriers Resolved   Patient Goals and CMS Choice Patient states their goals for this hospitalization and ongoing recovery are:: To return home   Choice offered to / list presented to : NA [place] ownership interest in [facility].provided to:: Parent NA    Discharge Placement Home  Patient to be transferred to facility by: Spouse Name of family member notified: Gabby Clontc Patient and family notified of of transfer: 10/19/24  Discharge Plan and Services Additional resources added to the After Visit Summary for  PCP visit                DME Arranged: N/A DME Agency: NA       HH Arranged: NA HH Agency: NA        Social Drivers of Health (SDOH) Interventions SDOH Screenings   Food Insecurity: No Food Insecurity (10/17/2024)  Housing: Low Risk (10/17/2024)  Transportation Needs: No Transportation Needs (10/17/2024)  Utilities: Not At Risk (10/17/2024)  Alcohol Screen: Low Risk (05/16/2023)  Recent Concern: Alcohol Screen - Medium Risk (05/15/2023)  Depression (PHQ2-9): Low Risk (08/06/2022)  Tobacco Use: High Risk (10/17/2024)     Readmission Risk Interventions    10/19/2024   10:31 AM  Readmission Risk  Prevention Plan  Post Dischage Appt Complete  Medication Screening Complete  Transportation Screening Complete     Signed: Heather Saltness, MSW, LCSW Clinical Social Worker Inpatient Care Management 10/19/2024 10:38 AM

## 2024-10-19 NOTE — Plan of Care (Signed)

## 2024-11-08 ENCOUNTER — Other Ambulatory Visit: Payer: Self-pay

## 2024-11-08 ENCOUNTER — Encounter (HOSPITAL_BASED_OUTPATIENT_CLINIC_OR_DEPARTMENT_OTHER): Payer: Self-pay | Admitting: Emergency Medicine

## 2024-11-08 ENCOUNTER — Emergency Department (HOSPITAL_BASED_OUTPATIENT_CLINIC_OR_DEPARTMENT_OTHER)
Admission: EM | Admit: 2024-11-08 | Discharge: 2024-11-08 | Discharge status: Against Medical Advice | Payer: Self-pay | Attending: Emergency Medicine | Admitting: Emergency Medicine

## 2024-11-08 DIAGNOSIS — Z5321 Procedure and treatment not carried out due to patient leaving prior to being seen by health care provider: Secondary | ICD-10-CM | POA: Insufficient documentation

## 2024-11-08 DIAGNOSIS — R1030 Lower abdominal pain, unspecified: Secondary | ICD-10-CM | POA: Insufficient documentation

## 2024-11-08 DIAGNOSIS — R112 Nausea with vomiting, unspecified: Secondary | ICD-10-CM | POA: Insufficient documentation

## 2024-11-08 MED ORDER — METOCLOPRAMIDE HCL 5 MG/ML IJ SOLN
10.0000 mg | Freq: Once | INTRAMUSCULAR | Status: AC
  Administered 2024-11-08: 10 mg via INTRAMUSCULAR
  Filled 2024-11-08: qty 2

## 2024-11-08 NOTE — ED Triage Notes (Signed)
 Lower abd pain  x a couple of weeks  nausea and vomiting

## 2024-11-10 ENCOUNTER — Encounter (HOSPITAL_COMMUNITY): Payer: Self-pay

## 2024-11-10 ENCOUNTER — Other Ambulatory Visit: Payer: Self-pay

## 2024-11-10 ENCOUNTER — Emergency Department (HOSPITAL_COMMUNITY)
Admission: EM | Admit: 2024-11-10 | Discharge: 2024-11-10 | Discharge status: Against Medical Advice | Payer: MEDICAID | Attending: Emergency Medicine | Admitting: Emergency Medicine

## 2024-11-10 DIAGNOSIS — R110 Nausea: Secondary | ICD-10-CM | POA: Insufficient documentation

## 2024-11-10 DIAGNOSIS — R10A1 Flank pain, right side: Secondary | ICD-10-CM | POA: Insufficient documentation

## 2024-11-10 DIAGNOSIS — Z5321 Procedure and treatment not carried out due to patient leaving prior to being seen by health care provider: Secondary | ICD-10-CM | POA: Insufficient documentation

## 2024-11-10 LAB — COMPREHENSIVE METABOLIC PANEL WITH GFR
ALT: 17 U/L (ref 0–44)
AST: 30 U/L (ref 15–41)
Albumin: 5 g/dL (ref 3.5–5.0)
Alkaline Phosphatase: 120 U/L (ref 38–126)
Anion gap: 19 — ABNORMAL HIGH (ref 5–15)
BUN: 22 mg/dL — ABNORMAL HIGH (ref 6–20)
CO2: 19 mmol/L — ABNORMAL LOW (ref 22–32)
Calcium: 10.9 mg/dL — ABNORMAL HIGH (ref 8.9–10.3)
Chloride: 101 mmol/L (ref 98–111)
Creatinine, Ser: 0.85 mg/dL (ref 0.61–1.24)
GFR, Estimated: >60 mL/min
Glucose, Bld: 137 mg/dL — ABNORMAL HIGH (ref 70–99)
Potassium: 3.9 mmol/L (ref 3.5–5.1)
Sodium: 140 mmol/L (ref 135–145)
Total Bilirubin: 0.8 mg/dL (ref 0.0–1.2)
Total Protein: 8.7 g/dL — ABNORMAL HIGH (ref 6.5–8.1)

## 2024-11-10 LAB — CBC
HCT: 47.2 % (ref 39.0–52.0)
Hemoglobin: 15.9 g/dL (ref 13.0–17.0)
MCH: 29.8 pg (ref 26.0–34.0)
MCHC: 33.7 g/dL (ref 30.0–36.0)
MCV: 88.6 fL (ref 80.0–100.0)
Platelets: 318 K/uL (ref 150–400)
RBC: 5.33 MIL/uL (ref 4.22–5.81)
RDW: 13.8 % (ref 11.5–15.5)
WBC: 19.3 K/uL — ABNORMAL HIGH (ref 4.0–10.5)
nRBC: 0 % (ref 0.0–0.2)

## 2024-11-10 LAB — LIPASE, BLOOD: Lipase: 26 U/L (ref 11–51)

## 2024-11-10 MED ORDER — ONDANSETRON 4 MG PO TBDP
4.0000 mg | ORAL_TABLET | Freq: Once | ORAL | Status: AC | PRN
  Administered 2024-11-10: 4 mg via ORAL
  Filled 2024-11-10: qty 1

## 2024-11-10 NOTE — ED Triage Notes (Signed)
 Pt reports right flank pain and abdominal burning x 2 months with worsening today. Pt also reports nausea.

## 2024-11-11 ENCOUNTER — Inpatient Hospital Stay (HOSPITAL_COMMUNITY)
Admission: EM | Admit: 2024-11-11 | Discharge: 2024-11-14 | DRG: 101 | Disposition: A | Discharge status: Home / Self Care | Payer: MEDICAID | Attending: Internal Medicine | Admitting: Internal Medicine

## 2024-11-11 ENCOUNTER — Emergency Department (HOSPITAL_COMMUNITY): Payer: MEDICAID

## 2024-11-11 ENCOUNTER — Other Ambulatory Visit: Payer: Self-pay

## 2024-11-11 ENCOUNTER — Encounter (HOSPITAL_COMMUNITY): Payer: Self-pay

## 2024-11-11 DIAGNOSIS — E876 Hypokalemia: Secondary | ICD-10-CM | POA: Diagnosis present

## 2024-11-11 DIAGNOSIS — R569 Unspecified convulsions: Principal | ICD-10-CM

## 2024-11-11 DIAGNOSIS — F4321 Adjustment disorder with depressed mood: Secondary | ICD-10-CM | POA: Diagnosis present

## 2024-11-11 DIAGNOSIS — G40509 Epileptic seizures related to external causes, not intractable, without status epilepticus: Principal | ICD-10-CM | POA: Diagnosis present

## 2024-11-11 DIAGNOSIS — Z888 Allergy status to other drugs, medicaments and biological substances status: Secondary | ICD-10-CM

## 2024-11-11 DIAGNOSIS — S069XAS Unspecified intracranial injury with loss of consciousness status unknown, sequela: Secondary | ICD-10-CM

## 2024-11-11 DIAGNOSIS — F17210 Nicotine dependence, cigarettes, uncomplicated: Secondary | ICD-10-CM | POA: Diagnosis present

## 2024-11-11 DIAGNOSIS — Z79899 Other long term (current) drug therapy: Secondary | ICD-10-CM

## 2024-11-11 DIAGNOSIS — D72829 Elevated white blood cell count, unspecified: Secondary | ICD-10-CM | POA: Diagnosis present

## 2024-11-11 DIAGNOSIS — F419 Anxiety disorder, unspecified: Secondary | ICD-10-CM | POA: Diagnosis present

## 2024-11-11 DIAGNOSIS — R1084 Generalized abdominal pain: Secondary | ICD-10-CM | POA: Diagnosis present

## 2024-11-11 DIAGNOSIS — G40909 Epilepsy, unspecified, not intractable, without status epilepticus: Secondary | ICD-10-CM

## 2024-11-11 DIAGNOSIS — S069XAA Unspecified intracranial injury with loss of consciousness status unknown, initial encounter: Secondary | ICD-10-CM | POA: Diagnosis present

## 2024-11-11 DIAGNOSIS — Z91148 Patient's other noncompliance with medication regimen for other reason: Secondary | ICD-10-CM

## 2024-11-11 DIAGNOSIS — R1116 Cannabis hyperemesis syndrome: Secondary | ICD-10-CM | POA: Diagnosis present

## 2024-11-11 DIAGNOSIS — Z720 Tobacco use: Secondary | ICD-10-CM | POA: Diagnosis present

## 2024-11-11 LAB — CBC WITH DIFFERENTIAL/PLATELET
Abs Immature Granulocytes: 0.07 K/uL (ref 0.00–0.07)
Basophils Absolute: 0 K/uL (ref 0.0–0.1)
Basophils Relative: 0 %
Eosinophils Absolute: 0.1 K/uL (ref 0.0–0.5)
Eosinophils Relative: 0 %
HCT: 46.3 % (ref 39.0–52.0)
Hemoglobin: 15.9 g/dL (ref 13.0–17.0)
Immature Granulocytes: 0 %
Lymphocytes Relative: 14 %
Lymphs Abs: 2.3 K/uL (ref 0.7–4.0)
MCH: 30.1 pg (ref 26.0–34.0)
MCHC: 34.3 g/dL (ref 30.0–36.0)
MCV: 87.5 fL (ref 80.0–100.0)
Monocytes Absolute: 1.5 K/uL — ABNORMAL HIGH (ref 0.1–1.0)
Monocytes Relative: 9 %
Neutro Abs: 12 K/uL — ABNORMAL HIGH (ref 1.7–7.7)
Neutrophils Relative %: 77 %
Platelets: 271 K/uL (ref 150–400)
RBC: 5.29 MIL/uL (ref 4.22–5.81)
RDW: 13.2 % (ref 11.5–15.5)
WBC: 15.9 K/uL — ABNORMAL HIGH (ref 4.0–10.5)
nRBC: 0 % (ref 0.0–0.2)

## 2024-11-11 LAB — URINALYSIS, ROUTINE W REFLEX MICROSCOPIC
Bilirubin Urine: NEGATIVE
Glucose, UA: NEGATIVE mg/dL
Hgb urine dipstick: NEGATIVE
Ketones, ur: NEGATIVE mg/dL
Leukocytes,Ua: NEGATIVE
Nitrite: NEGATIVE
Protein, ur: NEGATIVE mg/dL
Specific Gravity, Urine: 1.03 (ref 1.005–1.030)
pH: 5 (ref 5.0–8.0)

## 2024-11-11 LAB — COMPREHENSIVE METABOLIC PANEL WITH GFR
ALT: 13 U/L (ref 0–44)
AST: 28 U/L (ref 15–41)
Albumin: 4.6 g/dL (ref 3.5–5.0)
Alkaline Phosphatase: 111 U/L (ref 38–126)
Anion gap: 14 (ref 5–15)
BUN: 20 mg/dL (ref 6–20)
CO2: 24 mmol/L (ref 22–32)
Calcium: 9.8 mg/dL (ref 8.9–10.3)
Chloride: 100 mmol/L (ref 98–111)
Creatinine, Ser: 0.82 mg/dL (ref 0.61–1.24)
GFR, Estimated: >60 mL/min
Glucose, Bld: 112 mg/dL — ABNORMAL HIGH (ref 70–99)
Potassium: 3.5 mmol/L (ref 3.5–5.1)
Sodium: 138 mmol/L (ref 135–145)
Total Bilirubin: 0.7 mg/dL (ref 0.0–1.2)
Total Protein: 8.1 g/dL (ref 6.5–8.1)

## 2024-11-11 LAB — URINE DRUG SCREEN
Amphetamines: NEGATIVE
Barbiturates: NEGATIVE
Benzodiazepines: NEGATIVE
Cocaine: NEGATIVE
Fentanyl: NEGATIVE
Methadone Scn, Ur: NEGATIVE
Opiates: NEGATIVE
Tetrahydrocannabinol: POSITIVE — AB

## 2024-11-11 LAB — PHOSPHORUS: Phosphorus: 4.1 mg/dL (ref 2.5–4.6)

## 2024-11-11 LAB — MAGNESIUM: Magnesium: 2.7 mg/dL — ABNORMAL HIGH (ref 1.7–2.4)

## 2024-11-11 LAB — CK: Total CK: 125 U/L (ref 49–397)

## 2024-11-11 MED ORDER — DIVALPROEX SODIUM 500 MG PO DR TAB
500.0000 mg | DELAYED_RELEASE_TABLET | Freq: Two times a day (BID) | ORAL | Status: DC
  Administered 2024-11-12 – 2024-11-14 (×5): 500 mg via ORAL
  Filled 2024-11-11 (×5): qty 1

## 2024-11-11 MED ORDER — METOCLOPRAMIDE HCL 5 MG/ML IJ SOLN
5.0000 mg | Freq: Four times a day (QID) | INTRAMUSCULAR | Status: DC | PRN
  Administered 2024-11-13: 5 mg via INTRAVENOUS
  Filled 2024-11-11: qty 2

## 2024-11-11 MED ORDER — VALPROATE SODIUM 100 MG/ML IV SOLN
1500.0000 mg | Freq: Once | INTRAVENOUS | Status: AC
  Administered 2024-11-11: 1500 mg via INTRAVENOUS
  Filled 2024-11-11: qty 15

## 2024-11-11 MED ORDER — LORAZEPAM 2 MG/ML IJ SOLN
INTRAMUSCULAR | Status: AC
  Administered 2024-11-11: 2 mg via INTRAVENOUS
  Filled 2024-11-11: qty 1

## 2024-11-11 MED ORDER — LORAZEPAM 2 MG/ML IJ SOLN: 2.0000 mg | Freq: Once | INTRAMUSCULAR | Status: AC

## 2024-11-11 MED ORDER — LEVETIRACETAM (KEPPRA) 500 MG/5 ML ADULT IV PUSH
2000.0000 mg | Freq: Once | INTRAVENOUS | Status: AC
  Administered 2024-11-11: 2000 mg via INTRAVENOUS
  Filled 2024-11-11 (×2): qty 20

## 2024-11-11 MED ORDER — KETOROLAC TROMETHAMINE 30 MG/ML IJ SOLN
30.0000 mg | Freq: Once | INTRAMUSCULAR | Status: AC
  Administered 2024-11-11: 30 mg via INTRAVENOUS
  Filled 2024-11-11: qty 1

## 2024-11-11 MED ORDER — NICOTINE 14 MG/24HR TD PT24
14.0000 mg | MEDICATED_PATCH | Freq: Every day | TRANSDERMAL | Status: DC
  Administered 2024-11-11 – 2024-11-14 (×4): 14 mg via TRANSDERMAL
  Filled 2024-11-11 (×4): qty 1

## 2024-11-11 NOTE — ED Notes (Signed)
 First poc with patient. PT axox4. GCS 15. Denies pain. Remains on continuous cardiac, pulse ox and bp monitoring.  No respiratory distress noted.  Note seizure pads to side rails, suction near.  PT cooperative and pleasant.  PT provided dinner tray and drink.  PT ambulated to and from restroom without assistance.  Reconnected to continuous cardiac, pulse ox and bp monitoring device.  PT updated on plan of care and possible disposition.

## 2024-11-11 NOTE — ED Notes (Signed)
 At approximately 2044, ED staff called out for help, Primary RN went to patient bedside and witnessed patient exhibiting seizure-like activity.  Seizure lasted approx 2 minutes.  ED staff to bedside to assist as well as ED doctor.  Notified admitting doctor via secure chat.  Order received by Dr. Silvester to administer ativan  2mg  IVP.  Medication administered by Primary RN.  Pt tolerated well.  PT remained on continuous cardiac, pulse ox and bp monitoring.  Seizure pads and suction remain in place.  Holly, charge RN to bedside to assist with administering patient's ordered dose of Depacon  IVPB recently delivered by pharmacy tech.  No respiratory distress noted.  PT minutely postictal.  Dr.  Silvester arrived to bedside and pt now more alert and oriented, slight slur and slowness in speech.  Spouse of patient in route to ED room

## 2024-11-11 NOTE — Assessment & Plan Note (Signed)
 Supportive management Reglan  as needed rehydrate

## 2024-11-11 NOTE — H&P (Signed)
 "    Jaime Nixon FMW:[id number] DOB: [birth year] DOA: 11/11/2024     PCP: Patient, No Pcp Per    Patient arrived to ER on 11/11/24 at 1556 Referred by Attending Suzette Pac, MD   Patient coming from:    home Lives  With family     Chief Complaint:   Chief Complaint  Patient presents with   Seizures    HPI: Jaime Nixon is a 29 y.o. male with medical history significant of TBI, seizure disorder, cannabinoid hyperemesis and chronic abdominal pain    Presented with multiple seizures for the past 2 days Patient came in from home today wife reported that he has been having dozens of seizures He has known history of seizures and TBI.  Has been off medications for at least 2 years because could not afford. He does not seem to have a postictal state CBG 106  In the emergency department patient was loaded with Keppra  2 g CT head unremarkable neurology was consulted recommended changing him to Depakote  1500 mg followed by 500 mg twice a day and observed overnight  Of note per records it seems like he has been going to emergency department twice for lower abdominal pain nausea and vomiting for for past few weeks He has been admitted for the same in the past at that point he undergone imaging that showed unremarkable CT scan and felt that his abdominal pain was chronic and was secondary to cannabinoid hyperemesis and cyclic vomiting  Patient states that he has stopped using cannabis since his discharge He feels like his abdominal discomfort somewhat persisted worse with having bowel movements Denies any fevers or chills He reports for the past 2 days he has he has been having episodes of seizures he remains conscious while having a seizure episode.  Describes it as shaking of parts of his body.  No tongue biting no urinary or bowel incontinence he does feel somewhat weak generally afterwards but able to recollect what is happening.   There is not seem to have any pattern which extremity  involved.  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  Witnessed by the nurse seem to be lasting less than a minute or so his right arm was jerking eyes rolled back but he was able to have a conversation with ER provider and was alert and oriented thereafter Patient was given 2 mg of Ativan   Valproic  acid load was started Case discussed with neurology plan to transfer patient to [facility] for continuous EEG monitoring    Lab Orders         CBC with Differential         Comprehensive metabolic panel         Urinalysis, Routine w reflex microscopic -Urine, Clean Catch         Drug Screen 10 W/Conf, Serum      CT HEAD   NON acute      CTabd/pelvis -  nonacute   Following Medications were ordered in ER: Medications  valproate (DEPACON ) 1,500 mg in dextrose  5 % 50 mL IVPB (has no administration in time range)  levETIRAcetam  (KEPPRA ) undiluted injection 2,000 mg (2,000 mg Intravenous Given 11/11/24 1656)  ketorolac  (TORADOL ) 30 MG/ML injection 30 mg (30 mg Intravenous Given 11/11/24 1709)    _______________________________________________________ ER Provider Called:     Neurology   Dr Ulus They Recommend admit to medicine   Switching to Depakote  if no further episodes discharge tomorrow and follow-up as an outpatient     ED Triage Vitals  Encounter Vitals Group     BP 11/11/24 1604 (!) 132/92     Girls Systolic BP Percentile --      Girls Diastolic BP Percentile --      Boys Systolic BP Percentile --      Boys  Diastolic BP Percentile --      Pulse Rate 11/11/24 1604 83     Resp 11/11/24 1604 20     Temp 11/11/24 1604 (!) 97.4 F (36.3 C)     Temp Source 11/11/24 1604 Oral     SpO2 11/11/24 1604 98 %     Weight 11/11/24 2008 200 lb (90.7 kg)     Height 11/11/24 2008 6' (1.829 m)     Head Circumference --      Peak Flow --      Pain Score 11/11/24 1607 8     Pain Loc --      Pain Education --      Exclude from Growth Chart --   UFJK(75)@     _________________________________________ Significant initial  Findings: Abnormal Labs Reviewed  CBC WITH DIFFERENTIAL/PLATELET - Abnormal; Notable for the following components:      Result Value   WBC 15.9 (*)    Neutro Abs 12.0 (*)    Monocytes Absolute 1.5 (*)    All other components within normal limits  COMPREHENSIVE METABOLIC PANEL WITH GFR - Abnormal; Notable for the following components:   Glucose, Bld 112 (*)    All other components within normal limits       Cardiac Panel (last 3 results) Recent Labs    11/11/24 1652  CKTOTAL 125  90 ECG: Ordered Personally reviewed and interpreted by me showing: HR : 88 Rhythm:Sinus rhythm Borderline short PR interval QTC 408    The recent clinical data is shown below. Vitals:   11/11/24 1815 11/11/24 1828 11/11/24 2008 11/11/24 2011  BP: (!) 126/91   122/75  Pulse: 75   84  Resp: 19   (!) 21  Temp:  97.9 F (36.6 C)  98.1 F (36.7 C)  TempSrc:  Oral  Oral  SpO2: 99%   98%  Weight:   90.7 kg   Height:   6' (1.829 m)       WBC     Component Value Date/Time   WBC 15.9 (H) 11/11/2024 1652   LYMPHSABS 2.3 11/11/2024 1652   MONOABS 1.5 (H) 11/11/2024 1652   EOSABS 0.1 11/11/2024 1652   BASOSABS 0.0 11/11/2024 1652      UA   no evidence of UTI      Urine analysis:    Component Value Date/Time   COLORURINE YELLOW 11/11/2024 1716   APPEARANCEUR CLEAR 11/11/2024 1716   LABSPEC 1.030 11/11/2024 1716   PHURINE 5.0 11/11/2024 1716   GLUCOSEU NEGATIVE 11/11/2024 1716   HGBUR  NEGATIVE 11/11/2024 1716   BILIRUBINUR NEGATIVE 11/11/2024 1716   KETONESUR NEGATIVE  11/11/2024 1716   PROTEINUR NEGATIVE 11/11/2024 1716   UROBILINOGEN 0.2 06/01/2015 0503   NITRITE NEGATIVE 11/11/2024 1716   LEUKOCYTESUR NEGATIVE 11/11/2024 1716      __________________________________________________________ Recent Labs  Lab 11/10/24 0633 11/11/24 1652  NA 140 138  K 3.9 3.5  CO2 19* 24  GLUCOSE 137* 112*  BUN 22* 20  CREATININE 0.85 0.82  CALCIUM 10.9* 9.8    Cr stable,   Lab Results  Component Value Date   CREATININE 0.82 11/11/2024   CREATININE 0.85 11/10/2024   CREATININE 0.75 10/19/2024    Recent Labs  Lab 11/10/24 0633 11/11/24 1652  AST 30 28  ALT 17 13  ALKPHOS 120 111  BILITOT 0.8 0.7  PROT 8.7* 8.1  ALBUMIN 5.0 4.6   Lab Results  Component Value Date   CALCIUM 9.8 11/11/2024        Plt: Lab Results  Component Value Date   PLT 271 11/11/2024      Recent Labs  Lab 11/10/24 0633 11/11/24 1652  WBC 19.3* 15.9*  NEUTROABS  --  12.0*  HGB 15.9 15.9  HCT 47.2 46.3  MCV 88.6 87.5  PLT 318 271    HG/HCT   stable,     Component Value Date/Time   HGB 15.9 11/11/2024 1652   HCT 46.3 11/11/2024 1652   MCV 87.5 11/11/2024 1652    Recent Labs  Lab 11/10/24 0633  LIPASE 26   No results for input(s): AMMONIA in the last 168 hours.    _______________________________________________ Hospitalist was called for admission for seizure episodes  The following Work up has been ordered so far:  Orders Placed This Encounter  Procedures   CT Head Wo Contrast   CT Renal Stone Study   CBC with Differential   Comprehensive metabolic panel   Urinalysis, Routine w reflex microscopic -Urine, Clean Catch   Drug Screen 10 W/Conf, Serum   Consult to neurology   Consult to hospitalist   ED EKG   EKG 12-Lead      Cultures:    Component Value Date/Time   SDES BACK 12/26/2015 2219   SPECREQUEST NONE 12/26/2015 2219   CULT NO GROWTH 3 DAYS  12/26/2015 2219   REPTSTATUS 12/30/2015 FINAL 12/26/2015 2219     Radiological Exams on Admission: CT Head Wo Contrast Result Date: 11/11/2024 EXAM: CT HEAD WITHOUT CONTRAST 11/11/2024 04:40:31 PM TECHNIQUE: CT of the head was performed without the administration of intravenous contrast. Automated exposure control, iterative reconstruction, and/or weight based adjustment of the mA/kV was utilized to reduce the radiation dose to as low as reasonably achievable. COMPARISON: None available. CLINICAL HISTORY: Memory loss Memory loss Memory loss Memory loss Memory loss FINDINGS: BRAIN AND VENTRICLES: No acute hemorrhage. No evidence of acute infarct. No hydrocephalus. No extra-axial collection. No mass effect or midline shift. ORBITS: No acute abnormality. SINUSES: No acute abnormality. SOFT TISSUES AND SKULL: No acute soft tissue abnormality. No skull fracture. IMPRESSION: 1. No acute intracranial abnormality. Electronically signed by: Donnice Mania MD 11/11/2024 04:52 PM EST RP Workstation: [id number]   CT Renal Stone Study Result Date: 11/11/2024 EXAM: CT ABDOMEN AND PELVIS WITHOUT CONTRAST 11/11/2024 04:40:31 PM TECHNIQUE: CT of the abdomen and pelvis was performed without the administration of intravenous contrast. Multiplanar reformatted images are provided for review. Automated exposure control, iterative reconstruction, and/or weight-based adjustment of the mA/kV was utilized to reduce the radiation dose to as low as reasonably achievable. COMPARISON: 10/17/2024 CLINICAL HISTORY: Abdominal/flank pain, stone suspected. FINDINGS: LOWER CHEST: No  acute abnormality. LIVER: The liver is unremarkable. GALLBLADDER AND BILE DUCTS: Gallbladder is unremarkable. No biliary ductal dilatation. SPLEEN: No acute abnormality. PANCREAS: No acute abnormality. ADRENAL GLANDS: No acute abnormality. KIDNEYS, URETERS AND BLADDER: No stones in the kidneys or ureters. No hydronephrosis. No perinephric or periureteral stranding.  Urinary bladder is unremarkable. GI AND BOWEL: Stomach demonstrates no acute abnormality. The appendix is visualized and normal in caliber, without wall thickening, periappendiceal inflammation, or fluid. There is no bowel obstruction. PERITONEUM AND RETROPERITONEUM: No ascites. No free air. No free fluid or fluid collections. VASCULATURE: Aorta is normal in caliber. LYMPH NODES: No lymphadenopathy. REPRODUCTIVE ORGANS: Prostate gland appears normal. BONES AND SOFT TISSUES: No acute osseous abnormality. Small fat-containing umbilical hernia. No focal soft tissue abnormality. IMPRESSION: 1. No acute findings in the abdomen or pelvis. Electronically signed by: Waddell Calk MD 11/11/2024 04:51 PM EST RP Workstation: [id number]   _______________________________________________________________________________________________________ Latest  Blood pressure 122/75, pulse 84, temperature 98.1 F (36.7 C), temperature source Oral, resp. rate (!) 21, height 6' (1.829 m), weight 90.7 kg, SpO2 98%.   Vitals  labs and radiology finding personally reviewed  Review of Systems:    Pertinent positives include: Multiple seizures abdominal pain  Constitutional:  No weight loss, night sweats, Fevers, chills, fatigue, weight loss  HEENT:  No headaches, Difficulty swallowing,Tooth/dental problems,Sore throat,  No sneezing, itching, ear ache, nasal congestion, post nasal drip,  Cardio-vascular:  No chest pain, Orthopnea, PND, anasarca, dizziness, palpitations.no Bilateral lower extremity swelling  GI:  No heartburn, indigestion, abdominal pain, nausea, vomiting, diarrhea, change in bowel habits, loss of appetite, melena, blood in stool, hematemesis Resp:  no shortness of breath at rest. No dyspnea on exertion, No excess mucus, no productive cough, No non-productive cough, No coughing up of blood.No change in color of mucus.No wheezing. Skin:  no rash or lesions. No jaundice GU:  no dysuria, change in color of  urine, no urgency or frequency. No straining to urinate.  No flank pain.  Musculoskeletal:  No joint pain or no joint swelling. No decreased range of motion. No back pain.  Psych:  No change in mood or affect. No depression or anxiety. No memory loss.  Neuro: no localizing neurological complaints, no tingling, no weakness, no double vision, no gait abnormality, no slurred speech, no confusion  All systems reviewed and apart from HOPI all are negative _______________________________________________________________________________________________ Past Medical History:   Past Medical History:  Diagnosis Date   Current smoker    History of substance use    Seizures (HCC)       History reviewed. No pertinent surgical history.  Social History:  Ambulatory   independently      reports that he has been smoking cigarettes. He has a 10 pack-year smoking history. He has never used smokeless tobacco. He reports that he does not currently use alcohol. He reports that he does not currently use drugs after having used the following drugs: Marijuana.     Family History:   Family History  Problem Relation Age of Onset   Seizures Father    Seizures Brother    ______________________________________________________________________________________________ Allergies: Allergies[1]   Prior to Admission medications  Medication Sig Start Date End Date Taking? Authorizing Provider  acetaminophen  (TYLENOL ) 500 MG tablet Take 4 tablets by mouth every 6 (six) hours as needed for moderate pain (pain score 4-6) or mild pain (pain score 1-3).    [provider]  omeprazole  (PRILOSEC) 20 MG capsule Take 1 capsule (20 mg total) by mouth  daily. 10/15/24 11/14/24  Lang Norleen POUR, PA-C  ondansetron  (ZOFRAN -ODT) 4 MG disintegrating tablet Dissolve 1 tablet (4 mg total) by mouth every 8 (eight) hours as needed for nausea or vomiting. 10/19/24   Amin, Ankit C, MD  prochlorperazine  (COMPAZINE ) 10 MG  tablet Take 1 tablet (10 mg total) by mouth every 6 (six) hours as needed for refractory nausea / vomiting. 10/19/24   Amin, Ankit C, MD  sucralfate  (CARAFATE ) 1 g tablet Take 1 tablet (1 g total) by mouth 3 (three) times daily as needed. Patient taking differently: Take 1 g by mouth 3 (three) times daily as needed (stomach ulcers). 10/15/24 10/25/24  Robinson, John K, PA-C  levETIRAcetam  (KEPPRA ) 500 MG tablet Take 1 tablet (500 mg total) by mouth 2 (two) times daily. 12/13/18 02/07/20  Arloa Chroman, PA-C    ___________________________________________________________________________________________________ Physical Exam:    11/11/2024    8:11 PM 11/11/2024    8:08 PM 11/11/2024    6:15 PM  Vitals with BMI  Height  6' 0   Weight  200 lbs   BMI  27.12   Systolic 122  126  Diastolic 75  91  Pulse 84  75     1. General:  in No  Acute distress   Chronically ill  -appearing 2. Psychological: Alert and   Oriented 3. Head/ENT:    Dry Mucous Membranes                          Head Non traumatic, neck supple                          Poor Dentition 4. SKIN:  decreased Skin turgor,  Skin clean Dry and intact no rash    5. Heart: Regular rate and rhythm no  Murmur, no Rub or gallop 6. Lungs:  no wheezes or crackles   7. Abdomen: Soft,  non-tender, Non distended  bowel sounds present 8. Lower extremities: no clubbing, cyanosis, no  edema 9. Neurologically  strength 5 out of 5 in all 4 extremities cranial nerves II through XII intact 10. MSK: Normal range of motion    Chart has been reviewed  ______________________________________________________________________________________________  Assessment/Plan  29 y.o. male with medical history significant of TBI, seizure disorder, cannabinoid hyperemesis and chronic abdominal pain  Admitted for seizure-like activity   Present on Admission:  Cannabinoid hyperemesis syndrome  Generalized abdominal pain  Traumatic brain injury (HCC)   Tobacco abuse     Cannabinoid hyperemesis syndrome Supportive management Reglan  as needed rehydrate  Epilepsy (HCC) Patient with history of untreated seizure disorder.  In the past was on Keppra  then switched to unknown medication which she could not afford.  Per neurology recommend starting patient on Depakote  after loading.  500 milligrams tomorrow twice daily Patient will need to follow-up as an outpatient with neurology Given that seizures continued will  admit to [facility] for continuous EEG With patient's history of TBI and seizure disorder regardless of if patient currently have pseudoseizures or true seizures he needs to be on seizure medications  Generalized abdominal pain Chronic recurrent CT renal protocol nonacute  Traumatic brain injury (HCC) Chronic stable  Tobacco abuse  - Spoke about importance of quitting spent 5 minutes discussing options for treatment, prior attempts at quitting, and dangers of smoking  -At this point patient is     interested in quitting  - order nicotine  patch   - nursing  tobacco cessation protocol   Other plan as per orders.  DVT prophylaxis:  SCD      Code Status:    Code Status: Prior FULL CODE as per patient   I had personally discussed CODE STATUS with patient  ACP   none   Family Communication:   Family not at  Bedside    Diet heart healthy   Disposition Plan:        To home once workup is complete and patient is stable   Following barriers for discharge:                            Seizures work up is complete                                  Consult Orders  (From admission, onward)           Start     Ordered   11/11/24 1955  Consult to hospitalist  Once       Provider:  (Not yet assigned)  Question Answer Comment  Place call to: [facility]   Reason for Consult Admit      11/11/24 1954                               Consults called: neurology will see on arrival to [facility]   Admission status:  ED  Disposition     ED Disposition  Admit   Condition  --   Comment  Hospital Area: [facility] [[id number]]  Level of Care: Telemetry [5]  Admit to tele based on following criteria: Other see comments  Comments: seizure  May place patient in observation at [facility] or [facility] if equivalent level of care is available:: No  Diagnosis: Epilepsy [facility]) [[id number]]  Admitting Physician: Aquanetta Schwarz [[id number]]  Attending Physician: Alison Kubicki [[id number]]           Obs    Level of care     tele  For 12H      Alfred Eckley 11/11/2024, 9:27 PM    [facility]     after 2 AM please page floor coverage   If 7AM-7PM, please contact the day team taking care of the patient using [email or web address]        [1]  Allergies Allergen Reactions   Dexamethasone  Shortness Of Breath and Other (See Comments)    Makes pt angry    Prednisone Other (See Comments)    Makes pt angry    "

## 2024-11-11 NOTE — ED Provider Notes (Addendum)
 " [place] EMERGENCY DEPARTMENT AT [facility] Provider Note   CSN: [id number] Arrival date & time: 11/11/24  1556     Patient presents with: Seizures   Jaime Nixon is a 29 y.o. male.   Patient has a history of seizures.  He has not had a seizure in 2 years and has not been taking his medicine.  Patient had multiple seizures yesterday and today according to his wife.  The history is provided by the patient and medical records. No language interpreter was used.  Seizures Seizure activity on arrival: no   Seizure type:  Grand mal Preceding symptoms: aura   Initial focality:  None Episode characteristics: abnormal movements   Return to baseline: no   Severity:  Moderate Timing:  Clustered      Prior to Admission medications  Medication Sig Start Date End Date Taking? Authorizing Provider  acetaminophen  (TYLENOL ) 500 MG tablet Take 4 tablets by mouth every 6 (six) hours as needed for moderate pain (pain score 4-6) or mild pain (pain score 1-3).    [provider]  omeprazole  (PRILOSEC) 20 MG capsule Take 1 capsule (20 mg total) by mouth daily. 10/15/24 11/14/24  Robinson, John K, PA-C  ondansetron  (ZOFRAN -ODT) 4 MG disintegrating tablet Dissolve 1 tablet (4 mg total) by mouth every 8 (eight) hours as needed for nausea or vomiting. 10/19/24   Amin, Ankit C, MD  prochlorperazine  (COMPAZINE ) 10 MG tablet Take 1 tablet (10 mg total) by mouth every 6 (six) hours as needed for refractory nausea / vomiting. 10/19/24   Amin, Ankit C, MD  sucralfate  (CARAFATE ) 1 g tablet Take 1 tablet (1 g total) by mouth 3 (three) times daily as needed. Patient taking differently: Take 1 g by mouth 3 (three) times daily as needed (stomach ulcers). 10/15/24 10/25/24  Robinson, John K, PA-C  levETIRAcetam  (KEPPRA ) 500 MG tablet Take 1 tablet (500 mg total) by mouth 2 (two) times daily. 12/13/18 02/07/20  Harris, Abigail, PA-C    Allergies: Dexamethasone  and Prednisone    Review of  Systems  Constitutional:  Negative for appetite change and fatigue.  HENT:  Negative for congestion, ear discharge and sinus pressure.   Eyes:  Negative for discharge.  Respiratory:  Negative for cough.   Cardiovascular:  Negative for chest pain.  Gastrointestinal:  Negative for abdominal pain and diarrhea.  Genitourinary:  Negative for frequency and hematuria.  Musculoskeletal:  Negative for back pain.  Skin:  Negative for rash.  Neurological:  Positive for seizures. Negative for headaches.  Psychiatric/Behavioral:  Negative for hallucinations.     Updated Vital Signs BP 122/75 (BP Location: Right Arm)   Pulse 84   Temp 98.1 F (36.7 C) (Oral)   Resp (!) 21   Ht 6' (1.829 m)   Wt 90.7 kg   SpO2 98%   BMI 27.12 kg/m   Physical Exam Vitals and nursing note reviewed.  Constitutional:      Appearance: He is well-developed.  HENT:     Head: Normocephalic.     Nose: Nose normal.  Eyes:     General: No scleral icterus.    Conjunctiva/sclera: Conjunctivae normal.  Neck:     Thyroid : No thyromegaly.  Cardiovascular:     Rate and Rhythm: Normal rate and regular rhythm.     Heart sounds: No murmur heard.    No friction rub. No gallop.  Pulmonary:     Breath sounds: No stridor. No wheezing or rales.  Chest:  Chest wall: No tenderness.  Abdominal:     General: There is no distension.     Tenderness: There is no abdominal tenderness. There is no rebound.  Musculoskeletal:        General: Normal range of motion.     Cervical back: Neck supple.  Lymphadenopathy:     Cervical: No cervical adenopathy.  Skin:    Findings: No erythema or rash.  Neurological:     Mental Status: He is alert and oriented to person, place, and time.     Motor: No abnormal muscle tone.     Coordination: Coordination normal.  Psychiatric:        Behavior: Behavior normal.     (all labs ordered are listed, but only abnormal results are displayed) Labs Reviewed  CBC WITH  DIFFERENTIAL/PLATELET - Abnormal; Notable for the following components:      Result Value   WBC 15.9 (*)    Neutro Abs 12.0 (*)    Monocytes Absolute 1.5 (*)    All other components within normal limits  COMPREHENSIVE METABOLIC PANEL WITH GFR - Abnormal; Notable for the following components:   Glucose, Bld 112 (*)    All other components within normal limits  URINALYSIS, ROUTINE W REFLEX MICROSCOPIC  DRUG SCREEN 10 W/CONF, SERUM  URINE DRUG SCREEN  CK  MAGNESIUM   PHOSPHORUS    EKG: None  Radiology: CT Head Wo Contrast Result Date: 11/11/2024 EXAM: CT HEAD WITHOUT CONTRAST 11/11/2024 04:40:31 PM TECHNIQUE: CT of the head was performed without the administration of intravenous contrast. Automated exposure control, iterative reconstruction, and/or weight based adjustment of the mA/kV was utilized to reduce the radiation dose to as low as reasonably achievable. COMPARISON: None available. CLINICAL HISTORY: Memory loss Memory loss Memory loss Memory loss Memory loss FINDINGS: BRAIN AND VENTRICLES: No acute hemorrhage. No evidence of acute infarct. No hydrocephalus. No extra-axial collection. No mass effect or midline shift. ORBITS: No acute abnormality. SINUSES: No acute abnormality. SOFT TISSUES AND SKULL: No acute soft tissue abnormality. No skull fracture. IMPRESSION: 1. No acute intracranial abnormality. Electronically signed by: Donnice Mania MD 11/11/2024 04:52 PM EST RP Workstation: [id number]   CT Renal Stone Study Result Date: 11/11/2024 EXAM: CT ABDOMEN AND PELVIS WITHOUT CONTRAST 11/11/2024 04:40:31 PM TECHNIQUE: CT of the abdomen and pelvis was performed without the administration of intravenous contrast. Multiplanar reformatted images are provided for review. Automated exposure control, iterative reconstruction, and/or weight-based adjustment of the mA/kV was utilized to reduce the radiation dose to as low as reasonably achievable. COMPARISON: 10/17/2024 CLINICAL HISTORY:  Abdominal/flank pain, stone suspected. FINDINGS: LOWER CHEST: No acute abnormality. LIVER: The liver is unremarkable. GALLBLADDER AND BILE DUCTS: Gallbladder is unremarkable. No biliary ductal dilatation. SPLEEN: No acute abnormality. PANCREAS: No acute abnormality. ADRENAL GLANDS: No acute abnormality. KIDNEYS, URETERS AND BLADDER: No stones in the kidneys or ureters. No hydronephrosis. No perinephric or periureteral stranding. Urinary bladder is unremarkable. GI AND BOWEL: Stomach demonstrates no acute abnormality. The appendix is visualized and normal in caliber, without wall thickening, periappendiceal inflammation, or fluid. There is no bowel obstruction. PERITONEUM AND RETROPERITONEUM: No ascites. No free air. No free fluid or fluid collections. VASCULATURE: Aorta is normal in caliber. LYMPH NODES: No lymphadenopathy. REPRODUCTIVE ORGANS: Prostate gland appears normal. BONES AND SOFT TISSUES: No acute osseous abnormality. Small fat-containing umbilical hernia. No focal soft tissue abnormality. IMPRESSION: 1. No acute findings in the abdomen or pelvis. Electronically signed by: Waddell Calk MD 11/11/2024 04:51 PM EST RP Workstation: [id number]  Procedures   Medications Ordered in the ED  valproate (DEPACON ) 1,500 mg in dextrose  5 % 50 mL IVPB (has no administration in time range)  divalproex  (DEPAKOTE ) DR tablet 500 mg (has no administration in time range)  metoCLOPramide  (REGLAN ) injection 5 mg (has no administration in time range)  levETIRAcetam  (KEPPRA ) undiluted injection 2,000 mg (2,000 mg Intravenous Given 11/11/24 1656)  ketorolac  (TORADOL ) 30 MG/ML injection 30 mg (30 mg Intravenous Given 11/11/24 1709)   Patient with recurrent seizures.  I spoke with Dr. Michaela of neurology and he recommended placing the patient on Depakote  500 mg twice a day and admitting him to the hospitalist at [facility].  If the patient has a second seizure he recommends transferring the patient over  to [facility] for continuous EEG reading      CRITICAL CARE Performed by: Fairy Sermon Total critical care time: 40 minutes Critical care time was exclusive of separately billable procedures and treating other patients. Critical care was necessary to treat or prevent imminent or life-threatening deterioration. Critical care was time spent personally by me on the following activities: development of treatment plan with patient and/or surrogate as well as nursing, discussions with consultants, evaluation of patient's response to treatment, examination of patient, obtaining history from patient or surrogate, ordering and performing treatments and interventions, ordering and review of laboratory studies, ordering and review of radiographic studies, pulse oximetry and re-evaluation of patient's condition.  Patient had a seizure in the emergency department.  He was given Ativan .  His seizure was controlled.  Hospitalist was notified                               Medical Decision Making Amount and/or Complexity of Data Reviewed Labs: ordered. Radiology: ordered.  Risk Prescription drug management. Decision regarding hospitalization.   Seizures.  Patient will be admitted to medicine and placed on Depakote      Final diagnoses:  None    ED Discharge Orders     None          Sermon Fairy, MD 11/14/24 0930    Sermon Fairy, MD 11/14/24 0930  "

## 2024-11-11 NOTE — Assessment & Plan Note (Addendum)
 Patient with history of untreated seizure disorder.  In the past was on Keppra  then switched to unknown medication which she could not afford.  Per neurology recommend starting patient on Depakote  after loading.  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

## 2024-11-11 NOTE — Subjective & Objective (Signed)
 Patient came in from home today wife reported that he has been having dozens of seizures He has known history of seizures and TBI.  Has been off medications for at least 2 years because could not afford. He does not seem to have a postictal state CBG 106  In the emergency department patient was loaded with Keppra  2 g CT head unremarkable neurology was consulted recommended changing him to Depakote  1500 mg followed by 500 mg twice a day and observed overnight  Of note per records it seems like he has been going to emergency department twice for lower abdominal pain nausea and vomiting for for past few weeks He has been admitted for the same in the past at that point he undergone imaging that showed unremarkable CT scan and felt that his abdominal pain was chronic and was secondary to cannabinoid hyperemesis and cyclic vomiting

## 2024-11-11 NOTE — Assessment & Plan Note (Signed)
 Chronic stable

## 2024-11-11 NOTE — Assessment & Plan Note (Signed)
 Chronic recurrent CT renal protocol nonacute

## 2024-11-11 NOTE — Progress Notes (Signed)
 Phone discussion note.  Patient with a history of TBI and seizures who was previously managed on Keppra , but changed to another medication of which she is not sure what it was, but was unable to afford it.  For this reason he stopped taking medication and has been seizure-free for 2 years but over the past 2 days has had multiple seizures.  In the emergency department he was loaded with Keppra  2 g and is now currently back to baseline.  CT head is normal.  Keppra  is affordable, but with his history of intermittent explosive disorder, I do not think it would likely be a good medication for him.  He did recently have elevated LFTs due to hyperemesis syndrome but they have normalized and therefore I do think Depakote  would be a good choice for him.  I would give a single IV dose of 1500 mg valproic  acid, followed by 500 mg twice daily of Depakote .  If he were to have no further seizures, then he could be discharged on this dose with outpatient follow-up.  The high frequency after a long period of quiescence is unusual, and therefore if he continues to have seizures despite restarting medications, then I think he would need to be transferred for long-term EEG for spell characterization and formal neurology consultation.  A routine EEG could be considered but I doubt it would change management in the short-term given that he has a history of TBI with seizure disorder and therefore even if it was normal I would continue antiepileptics.  Please call if neurology can be of any further assistance.  Aisha Seals, MD [facility]   If 7pm- 7am, please page neurology on call as listed in AMION.

## 2024-11-11 NOTE — ED Triage Notes (Addendum)
 Patient BIBA coming from home, wife reports a dozen seizures the last two days, here yesterday for flank pain, 2 active seizures en route lasting 45 second, patient does not have post ictal state in between. VSS. CBG 106. 20g Left hand. C/o pain all over. Patient is alert and oriented x 4. Airway patent, respirations even and unlabored. Skin normal, warm and dry. 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

## 2024-11-11 NOTE — Assessment & Plan Note (Signed)
-  Spoke about importance of quitting spent 5 minutes discussing options for treatment, prior attempts at quitting, and dangers of smoking ? -At this point patient is    interested in quitting ? - order nicotine patch  ? - nursing tobacco cessation protocol ? ?

## 2024-11-12 ENCOUNTER — Observation Stay (HOSPITAL_COMMUNITY): Payer: MEDICAID

## 2024-11-12 DIAGNOSIS — Z8659 Personal history of other mental and behavioral disorders: Secondary | ICD-10-CM

## 2024-11-12 DIAGNOSIS — D72829 Elevated white blood cell count, unspecified: Secondary | ICD-10-CM

## 2024-11-12 DIAGNOSIS — E876 Hypokalemia: Secondary | ICD-10-CM

## 2024-11-12 DIAGNOSIS — R569 Unspecified convulsions: Secondary | ICD-10-CM

## 2024-11-12 LAB — COMPREHENSIVE METABOLIC PANEL WITH GFR
ALT: 9 U/L (ref 0–44)
AST: 22 U/L (ref 15–41)
Albumin: 3.6 g/dL (ref 3.5–5.0)
Alkaline Phosphatase: 80 U/L (ref 38–126)
Anion gap: 11 (ref 5–15)
BUN: 22 mg/dL — ABNORMAL HIGH (ref 6–20)
CO2: 19 mmol/L — ABNORMAL LOW (ref 22–32)
Calcium: 7.3 mg/dL — ABNORMAL LOW (ref 8.9–10.3)
Chloride: 109 mmol/L (ref 98–111)
Creatinine, Ser: 0.75 mg/dL (ref 0.61–1.24)
GFR, Estimated: >60 mL/min
Glucose, Bld: 96 mg/dL (ref 70–99)
Potassium: 3.2 mmol/L — ABNORMAL LOW (ref 3.5–5.1)
Sodium: 140 mmol/L (ref 135–145)
Total Bilirubin: 0.3 mg/dL (ref 0.0–1.2)
Total Protein: 5.8 g/dL — ABNORMAL LOW (ref 6.5–8.1)

## 2024-11-12 LAB — CBC
HCT: 43.3 % (ref 39.0–52.0)
Hemoglobin: 14.8 g/dL (ref 13.0–17.0)
MCH: 30.9 pg (ref 26.0–34.0)
MCHC: 34.2 g/dL (ref 30.0–36.0)
MCV: 90.4 fL (ref 80.0–100.0)
Platelets: 236 K/uL (ref 150–400)
RBC: 4.79 MIL/uL (ref 4.22–5.81)
RDW: 13.2 % (ref 11.5–15.5)
WBC: 12.6 K/uL — ABNORMAL HIGH (ref 4.0–10.5)
nRBC: 0 % (ref 0.0–0.2)

## 2024-11-12 LAB — PHOSPHORUS: Phosphorus: 3.4 mg/dL (ref 2.5–4.6)

## 2024-11-12 LAB — MAGNESIUM: Magnesium: 2.5 mg/dL — ABNORMAL HIGH (ref 1.7–2.4)

## 2024-11-12 MED ORDER — LORAZEPAM 2 MG/ML IJ SOLN: 2.0000 mg | Freq: Once | INTRAMUSCULAR | Status: AC

## 2024-11-12 MED ORDER — SUCRALFATE 1 G PO TABS: 1.0000 g | ORAL_TABLET | Freq: Three times a day (TID) | ORAL | Status: DC | PRN

## 2024-11-12 MED ORDER — SODIUM CHLORIDE 0.9 % IV SOLN: INTRAVENOUS | Status: AC

## 2024-11-12 MED ORDER — GADOBUTROL 1 MMOL/ML IV SOLN
9.0000 mL | Freq: Once | INTRAVENOUS | Status: AC | PRN
  Administered 2024-11-12: 9 mL via INTRAVENOUS

## 2024-11-12 MED ORDER — ACETAMINOPHEN 325 MG PO TABS: 650.0000 mg | ORAL_TABLET | Freq: Four times a day (QID) | ORAL | Status: DC | PRN

## 2024-11-12 MED ORDER — ONDANSETRON HCL 4 MG PO TABS: 4.0000 mg | ORAL_TABLET | Freq: Four times a day (QID) | ORAL | Status: DC | PRN

## 2024-11-12 MED ORDER — POTASSIUM CHLORIDE CRYS ER 20 MEQ PO TBCR
40.0000 meq | EXTENDED_RELEASE_TABLET | ORAL | Status: AC
  Administered 2024-11-12 (×2): 40 meq via ORAL
  Filled 2024-11-12 (×2): qty 2

## 2024-11-12 MED ORDER — LORAZEPAM 2 MG/ML IJ SOLN
INTRAMUSCULAR | Status: AC
  Administered 2024-11-12: 2 mg via INTRAVENOUS
  Filled 2024-11-12: qty 1

## 2024-11-12 MED ORDER — HYDROCODONE-ACETAMINOPHEN 5-325 MG PO TABS
1.0000 | ORAL_TABLET | ORAL | Status: DC | PRN
  Administered 2024-11-12: 1 via ORAL
  Filled 2024-11-12: qty 1

## 2024-11-12 MED ORDER — FENTANYL CITRATE (PF) 50 MCG/ML IJ SOSY
12.5000 ug | PREFILLED_SYRINGE | INTRAMUSCULAR | Status: DC | PRN
  Administered 2024-11-13: 50 ug via INTRAVENOUS
  Filled 2024-11-12: qty 1

## 2024-11-12 MED ORDER — LORAZEPAM 2 MG/ML IJ SOLN
2.0000 mg | INTRAMUSCULAR | Status: DC | PRN
  Administered 2024-11-12 – 2024-11-13 (×2): 2 mg via INTRAVENOUS
  Filled 2024-11-12 (×2): qty 1

## 2024-11-12 MED ORDER — PANTOPRAZOLE SODIUM 40 MG PO TBEC
40.0000 mg | DELAYED_RELEASE_TABLET | Freq: Every day | ORAL | Status: DC
  Administered 2024-11-12 – 2024-11-14 (×3): 40 mg via ORAL
  Filled 2024-11-12 (×3): qty 1

## 2024-11-12 MED ORDER — TRAZODONE HCL 50 MG PO TABS
50.0000 mg | ORAL_TABLET | Freq: Every evening | ORAL | Status: DC | PRN
  Administered 2024-11-12 – 2024-11-13 (×2): 50 mg via ORAL
  Filled 2024-11-12 (×2): qty 1

## 2024-11-12 MED ORDER — ENSURE PLUS HIGH PROTEIN PO LIQD
237.0000 mL | Freq: Two times a day (BID) | ORAL | Status: DC
  Administered 2024-11-14: 237 mL via ORAL

## 2024-11-12 MED ORDER — ONDANSETRON HCL 4 MG/2ML IJ SOLN
4.0000 mg | Freq: Four times a day (QID) | INTRAMUSCULAR | Status: DC | PRN
  Administered 2024-11-13 (×2): 4 mg via INTRAVENOUS
  Filled 2024-11-12 (×2): qty 2

## 2024-11-12 MED ORDER — ACETAMINOPHEN 650 MG RE SUPP: 650.0000 mg | Freq: Four times a day (QID) | RECTAL | Status: DC | PRN

## 2024-11-12 NOTE — ED Notes (Signed)
 RN to room pt noted to be having a seizure, movement of all extremities and face involved, lasted appx 60-70 seconds, pt postictal with no speech for 3 mins, pt was then able to state first name. Pt was given ativan  per [facility], pt tolerated well, MD Sebastian raker and informed of pt seizure, RN is awaiting response currently. When asked about any aura before seizure pt reports , he was sleeping.

## 2024-11-12 NOTE — ED Notes (Signed)
 Transport has arrived for pt , pt a/o x4 at this time , RR even and unlabored.

## 2024-11-12 NOTE — ED Notes (Signed)
 Mother of patient arrived for visit.  Escorted by security to patient's room.  Mother's personal belongings left at nurses' station.  Mother also brought overnight bag for patient she states has pajamas, etc.  This bag placed in cabinet 19-22 with patient's other x2 belongings bags.

## 2024-11-12 NOTE — ED Notes (Signed)
 Pt currently being undress by paramedic, pt with nicotine  patch removed, pt aware he can received another one when he returns from MRI with morning meds, pt agrees.

## 2024-11-12 NOTE — Consult Note (Signed)
 NEUROLOGY CONSULT NOTE   Date of service: November 12, 2024 Patient Name: Jaime Nixon MRN:  [id number] DOB:  13-Jul-1996 Chief Complaint: Seizures Requesting Provider: Doutova, Anastassia, MD  History of Present Illness  Jaime Nixon is a 29 y.o. male with hx of with a history of seizures starting in 2017 who developed seizures in the setting of a small subdural and was started on Keppra  at that time.  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    He states that he went for his first refill and found the price to be too high and was unable to continue it.  Since that time he states that about every 6 months to a year he will have breakthrough seizures, symptoms occurring in clusters.  He has had approximately six over the past couple of days (patient estimates).  I was contacted last night, and recommended loading with Depacon  and starting Depakote , but unfortunately he has continued to have breakthrough seizures.  The semiology described was slightly unusual, with jerking with preserved consciousness, and he reportedly will have sometimes one side and with symptoms the other side involved.  He states that typically, however, it is his right arm and that is what happened today and his right arm twitched for quite a long time.  Past History   Past Medical History:  Diagnosis Date   Current smoker    History of substance use    Seizures (HCC)     History reviewed. No pertinent surgical history.  Family History: Family History  Problem Relation Age of Onset   Seizures Father    Seizures Brother     Social History  reports that he has been smoking cigarettes. He has a 10 pack-year smoking history. He has never used smokeless tobacco. He reports that he does not currently use alcohol. He reports that  he does not currently use drugs after having used the following drugs: Marijuana.  Allergies[1]  Medications  Current Medications[2]  Vitals   Vitals:   11/12/24 0530 11/12/24 0533 11/12/24 0535 11/12/24 0540  BP:  104/69 111/76 97/76  Pulse: 76 76 86 88  Resp: 15 15 (!) 23 11  Temp:    98.6 F (37 C)  TempSrc:    Oral  SpO2: 97% 94% 99% 100%  Weight:      Height:        Body mass index is 27.12 kg/m.   Physical Exam   Constitutional: Appears well-developed and well-nourished.  Neurologic Examination    Neuro: Mental Status: Patient is awake, alert, oriented to person, place, month, year, and situation. Patient is able to give a clear and coherent history. No signs of aphasia or neglect Cranial Nerves: II: Visual Fields are full. Pupils are equal, round, and reactive to light.   III,IV, VI: EOMI without ptosis or diploplia.  V: Facial sensation is diminished in the right face VII: Facial movement is symmetric.  VIII: hearing is intact to voice X: Uvula elevates symmetrically XII: tongue is midline without atrophy or fasciculations.  Motor: Tone is normal. Bulk is normal. 5/5 strength was present in all four extremities.  Sensory: Sensation is diminished in the right arm but not leg  Cerebellar: Did not perform       Labs/Imaging/Neurodiagnostic studies   CBC:  Recent Labs  Lab December 07, 2024 0633 11/11/24  1652  WBC 19.3* 15.9*  NEUTROABS  --  12.0*  HGB 15.9 15.9  HCT 47.2 46.3  MCV 88.6 87.5  PLT 318 271   Basic Metabolic Panel:  Lab Results  Component Value Date   NA 138 11/11/2024   K 3.5 11/11/2024   CO2 24 11/11/2024   GLUCOSE 112 (H) 11/11/2024   BUN 20 11/11/2024   CREATININE 0.82 11/11/2024   CALCIUM 9.8 11/11/2024   GFRNONAA >60 11/11/2024   GFRAA >60 05/29/2020   Lipid Panel:  Lab Results  Component Value Date   LDLCALC 149 (H) 05/15/2023   HgbA1c:  Lab Results  Component Value Date   HGBA1C 5.5 05/15/2023   Urine Drug  Screen:     Component Value Date/Time   LABOPIA NEGATIVE 11/11/2024 1716   COCAINSCRNUR NEGATIVE 11/11/2024 1716   LABBENZ NEGATIVE 11/11/2024 1716   AMPHETMU NEGATIVE 11/11/2024 1716   THCU POSITIVE (A) 11/11/2024 1716   LABBARB NEGATIVE 11/11/2024 1716    Alcohol Level     Component Value Date/Time   ETH <15 10/17/2024 0404     CT Head without contrast(Personally reviewed): Negative  ASSESSMENT   Jaime Nixon is a 29 y.o. male with a history of seizures since 2017 who stopped taking his medications about 2 years ago with no further seizures and then presented with multiple recurrent seizures.  Of note, he does have a history of intermittent explosive disorder.  I was contacted about him last night with the plan to start him on Depakote  however he continued to have seizures and therefore he is being admitted for observation.  With focal numbness, I think an MRI would also be prudent.  He had pachymeningitis  Given the unusual nature of frequent recurrent seizures after long periods of seizure control, as well as the description concerning for an unusual semiology, I do think that EEG monitoring to confirm the epileptic nature of these events would be prudent.  RECOMMENDATIONS  Continue Depacon  500 twice daily Repeat MRI brain with and without contrast Transferred to [facility] for LTM EEG Neurology will follow once at [facility]. ______________________________________________________________________    Signed, Aisha Seals, MD [facility]     [1]  Allergies Allergen Reactions   Dexamethasone  Shortness Of Breath and Other (See Comments)    Makes pt angry    Prednisone Other (See Comments)    Makes pt angry   [2]  Current Facility-Administered Medications:    0.9 %  sodium chloride  infusion, , Intravenous, Continuous, Doutova, Anastassia, MD, Last Rate: 100 mL/hr at 11/12/24 0542, New Bag at 11/12/24 0542   acetaminophen  (TYLENOL ) tablet 650 mg, 650 mg,  Oral, Q6H PRN **OR** acetaminophen  (TYLENOL ) suppository 650 mg, 650 mg, Rectal, Q6H PRN, Doutova, Anastassia, MD   divalproex  (DEPAKOTE ) DR tablet 500 mg, 500 mg, Oral, Q12H, Doutova, Anastassia, MD, 500 mg at 11/12/24 0539   fentaNYL  (SUBLIMAZE ) injection 12.5-50 mcg, 12.5-50 mcg, Intravenous, Q2H PRN, Doutova, Anastassia, MD   HYDROcodone -acetaminophen  (NORCO/VICODIN) 5-325 MG per tablet 1-2 tablet, 1-2 tablet, Oral, Q4H PRN, Doutova, Anastassia, MD   metoCLOPramide  (REGLAN ) injection 5 mg, 5 mg, Intravenous, Q6H PRN, Doutova, Anastassia, MD   nicotine  (NICODERM CQ  - dosed in mg/24 hours) patch 14 mg, 14 mg, Transdermal, Daily, Doutova, Anastassia, MD, 14 mg at 11/11/24 2209   ondansetron  (ZOFRAN ) tablet 4 mg, 4 mg, Oral, Q6H PRN **OR** ondansetron  (ZOFRAN ) injection 4 mg, 4 mg, Intravenous, Q6H PRN, Doutova, Anastassia, MD   pantoprazole  (PROTONIX ) EC tablet 40 mg, 40 mg, Oral,  Daily, Doutova, Anastassia, MD   sucralfate  (CARAFATE ) tablet 1 g, 1 g, Oral, TID PRN, Doutova, Anastassia, MD  Current Outpatient Medications:    acetaminophen  (TYLENOL ) 500 MG tablet, Take 4 tablets by mouth every 6 (six) hours as needed for moderate pain (pain score 4-6) or mild pain (pain score 1-3)., Disp: , Rfl:    omeprazole  (PRILOSEC) 20 MG capsule, Take 1 capsule (20 mg total) by mouth daily., Disp: 30 capsule, Rfl: 0   ondansetron  (ZOFRAN -ODT) 4 MG disintegrating tablet, Dissolve 1 tablet (4 mg total) by mouth every 8 (eight) hours as needed for nausea or vomiting., Disp: 30 tablet, Rfl: 0   prochlorperazine  (COMPAZINE ) 10 MG tablet, Take 1 tablet (10 mg total) by mouth every 6 (six) hours as needed for refractory nausea / vomiting., Disp: 30 tablet, Rfl: 0   sucralfate  (CARAFATE ) 1 g tablet, Take 1 tablet (1 g total) by mouth 3 (three) times daily as needed. (Patient taking differently: Take 1 g by mouth 3 (three) times daily as needed (stomach ulcers).), Disp: 30 tablet, Rfl: 0

## 2024-11-12 NOTE — Progress Notes (Signed)
 " PROGRESS NOTE    Jaime Nixon  FMW:[id number] DOB: Sep 13, 1996 DOA: 11/11/2024 PCP: Patient, No Pcp Per    Chief Complaint  Patient presents with   Seizures    Brief Narrative:  Patient 29 year old gentleman history of TBI, seizure disorder, cannabinoid hyperemesis, chronic abdominal pain presenting to the ED with multiple seizures x 2 days.  Patient with history of seizures and TBI had been off medications for at least 2 years due to financial constraints.  Patient noted to have been seen over the past few weeks with nausea vomiting abdominal pain undergoing workup and noted to have cannabinoid hyperemesis and cyclical vomiting syndrome.  Patient noted to have stopped using cannabis since last discharge.  Patient seen in the ED at another episode of seizure-like activity witnessed by RN.  Patient given as needed IV Ativan .  Patient received a loading dose of Depakote  however continued to have seizures.  Patient seen in consultation by neurohospitalist who recommended Depakote  500 mg twice daily, repeat MRI, transferred to [facility] for LTM EEG.   Assessment & Plan:   Principal Problem:   Epilepsy (HCC) Active Problems:   Leukocytosis   Cannabinoid hyperemesis syndrome   Generalized abdominal pain   Traumatic brain injury (HCC)   Tobacco abuse   Hypokalemia  #1 seizures -Patient with prior history of untreated seizure disorder. - Patient noted per admitting physician to have been on Keppra  in the past and subsequently switched on unknown medication which patient was unable to afford.  Patient has been without AEDs for 2 years. - Patient noted to have presented with breakthrough seizures with ongoing seizures. - Patient loaded with Depakote  however patient with continued seizures in the ED. - CT head with no acute abnormalities. - Chest x-ray unremarkable. - Urinalysis bland. - Patient with a potassium of 3.2, magnesium  of 2.5, phosphorus of 3.4. -MRI brain done this morning negative  for any acute abnormalities. - Replete electrolytes. - Patient seen in consultation by neurohospitalist who recommended continuation of Depakote  500 mg twice daily, MRI, transferred to [facility] for LTM EEG. - Placed on IV Ativan  as needed seizures. - Seizure precautions. 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 - Neurohospitalist following and appreciate input and recommendations.  2.  Hypokalemia -Replete.  3.  Cannabinoid hyperemesis syndrome -IV antiemetics, IV fluids, supportive care.  4.  Generalized chronic abdominal pain -CT renal stone protocol with no acute abnormalities. - Pain management.  5.  TBI -Stable.  6.  Tobacco abuse -Tobacco cessation. - Continue nicotine  patch.  7.  Leukocytosis -Likely reactive leukocytosis secondary to problem #1. - Urinalysis bland. - Chest x-ray negative. - Patient afebrile. - Monitor off antibiotics.   DVT prophylaxis: SCDs Code Status: Full Family Communication: Updated patient.  No family at bedside. Disposition: Patient being transferred to [facility].  Status is: Observation The patient remains OBS appropriate and will d/c before 2 midnights.   Consultants:  Neurology: Dr. Michaela 11/12/2024  Procedures:  CT renal stone protocol 11/11/2024 CT head 11/11/2024 MRI head 11/12/2024 Chest x-ray 11/12/2024   Antimicrobials:  Anti-infectives (From admission, onward)    None         Subjective: Patient laying on gurney.  Denies any chest pain or shortness of breath.  Denies any ongoing abdominal pain.  Denies any dysuria.  Noted to have had a seizure in the  ED but none since.  Awaiting MRI brain.  Objective: Vitals:   11/12/24 1415 11/12/24 1425 11/12/24 1430 11/12/24 1457  BP:  100/62  110/69  Pulse: 87 87 81 84  Resp: (!) 34 (!) 30 (!) 23 (!) 24  Temp:      TempSrc:       SpO2: 98% 98% 98% 96%  Weight:      Height:        Intake/Output Summary (Last 24 hours) at 11/12/2024 1532 Last data filed at 11/11/2024 2154 Gross per 24 hour  Intake 63.02 ml  Output --  Net 63.02 ml   Filed Weights   11/11/24 2008  Weight: 90.7 kg    Examination:  General exam: Appears calm and comfortable  Respiratory system: Clear to auscultation. Respiratory effort normal. Cardiovascular system: S1 & S2 heard, RRR. No JVD, murmurs, rubs, gallops or clicks. No pedal edema. Gastrointestinal system: Abdomen is nondistended, soft and nontender. No organomegaly or masses felt. Normal bowel sounds heard. Central nervous system: Alert and oriented. No focal neurological deficits. Extremities: Symmetric 5 x 5 power. Skin: No rashes, lesions or ulcers Psychiatry: Judgement and insight appear normal. Mood & affect appropriate.     Data Reviewed: I have personally reviewed following labs and imaging studies  CBC: Recent Labs  Lab 11/10/24 0633 11/11/24 1652 11/12/24 0528  WBC 19.3* 15.9* 12.6*  NEUTROABS  --  12.0*  --   HGB 15.9 15.9 14.8  HCT 47.2 46.3 43.3  MCV 88.6 87.5 90.4  PLT 318 271 236    Basic Metabolic Panel: Recent Labs  Lab 11/10/24 0633 11/11/24 1652 11/12/24 0528  NA 140 138 140  K 3.9 3.5 3.2*  CL 101 100 109  CO2 19* 24 19*  GLUCOSE 137* 112* 96  BUN 22* 20 22*  CREATININE 0.85 0.82 0.75  CALCIUM 10.9* 9.8 7.3*  MG  --  2.7* 2.5*  PHOS  --  4.1 3.4    GFR: Estimated Creatinine Clearance: 148.2 mL/min (by C-G formula based on SCr of 0.75 mg/dL).  Liver Function Tests: Recent Labs  Lab 11/10/24 0633 11/11/24 1652 11/12/24 0528  AST 30 28 22   ALT 17 13 9   ALKPHOS 120 111 80  BILITOT 0.8 0.7 0.3  PROT 8.7* 8.1 5.8*  ALBUMIN 5.0 4.6 3.6    CBG: No results for input(s): GLUCAP in the last 168 hours.   No results found for this or any previous visit (from the past 240 hours).       Radiology Studies: MR BRAIN W WO  CONTRAST Result Date: 11/12/2024 CLINICAL DATA:  Seizure disorder, clinical chain EXAM: MRI HEAD WITHOUT AND WITH CONTRAST TECHNIQUE: Multiplanar, multiecho pulse sequences of the brain and surrounding structures were obtained without and with intravenous contrast. CONTRAST:  9mL GADAVIST  GADOBUTROL  1 MMOL/ML IV SOLN COMPARISON:  December 26, 2015 FINDINGS: MRI brain: The brain is structurally normal. The mesial temporal lobes are normal and symmetric. The signal in the brain parenchyma is normal. No abnormal enhancement. There is no acute or chronic infarct. The ventricles are normal. No mass lesion. There are normal flow signals in the carotid arteries and basilar artery. No significant bone marrow signal abnormality. No significant abnormality in the paranasal sinuses or soft tissues. IMPRESSION: Normal Electronically Signed   By: Nancyann Burns M.D.   On: 11/12/2024 10:06   DG CHEST PORT 1 VIEW Result Date: 11/12/2024 EXAM: 1 VIEW(S) XRAY OF THE CHEST 11/12/2024 09:02:00 AM COMPARISON: 10/15/2024 CLINICAL HISTORY: Leukocytosis  FINDINGS: LUNGS AND PLEURA: No focal pulmonary opacity. No pleural effusion. No pneumothorax. HEART AND MEDIASTINUM: No acute abnormality of the cardiac and mediastinal silhouettes. BONES AND SOFT TISSUES: No acute osseous abnormality. IMPRESSION: 1. No acute process. Electronically signed by: Waddell Calk MD 11/12/2024 09:21 AM EST RP Workstation: [id number]   CT Head Wo Contrast Result Date: 11/11/2024 EXAM: CT HEAD WITHOUT CONTRAST 11/11/2024 04:40:31 PM TECHNIQUE: CT of the head was performed without the administration of intravenous contrast. Automated exposure control, iterative reconstruction, and/or weight based adjustment of the mA/kV was utilized to reduce the radiation dose to as low as reasonably achievable. COMPARISON: None available. CLINICAL HISTORY: Memory loss Memory loss Memory loss Memory loss Memory loss FINDINGS: BRAIN AND VENTRICLES: No acute hemorrhage. No  evidence of acute infarct. No hydrocephalus. No extra-axial collection. No mass effect or midline shift. ORBITS: No acute abnormality. SINUSES: No acute abnormality. SOFT TISSUES AND SKULL: No acute soft tissue abnormality. No skull fracture. IMPRESSION: 1. No acute intracranial abnormality. Electronically signed by: Donnice Mania MD 11/11/2024 04:52 PM EST RP Workstation: [id number]   CT Renal Stone Study Result Date: 11/11/2024 EXAM: CT ABDOMEN AND PELVIS WITHOUT CONTRAST 11/11/2024 04:40:31 PM TECHNIQUE: CT of the abdomen and pelvis was performed without the administration of intravenous contrast. Multiplanar reformatted images are provided for review. Automated exposure control, iterative reconstruction, and/or weight-based adjustment of the mA/kV was utilized to reduce the radiation dose to as low as reasonably achievable. COMPARISON: 10/17/2024 CLINICAL HISTORY: Abdominal/flank pain, stone suspected. FINDINGS: LOWER CHEST: No acute abnormality. LIVER: The liver is unremarkable. GALLBLADDER AND BILE DUCTS: Gallbladder is unremarkable. No biliary ductal dilatation. SPLEEN: No acute abnormality. PANCREAS: No acute abnormality. ADRENAL GLANDS: No acute abnormality. KIDNEYS, URETERS AND BLADDER: No stones in the kidneys or ureters. No hydronephrosis. No perinephric or periureteral stranding. Urinary bladder is unremarkable. GI AND BOWEL: Stomach demonstrates no acute abnormality. The appendix is visualized and normal in caliber, without wall thickening, periappendiceal inflammation, or fluid. There is no bowel obstruction. PERITONEUM AND RETROPERITONEUM: No ascites. No free air. No free fluid or fluid collections. VASCULATURE: Aorta is normal in caliber. LYMPH NODES: No lymphadenopathy. REPRODUCTIVE ORGANS: Prostate gland appears normal. BONES AND SOFT TISSUES: No acute osseous abnormality. Small fat-containing umbilical hernia. No focal soft tissue abnormality. IMPRESSION: 1. No acute findings in the abdomen or  pelvis. Electronically signed by: Waddell Calk MD 11/11/2024 04:51 PM EST RP Workstation: [id number]        Scheduled Meds:  divalproex   500 mg Oral Q12H   nicotine   14 mg Transdermal Daily   pantoprazole   40 mg Oral Daily   Continuous Infusions:     LOS: 0 days    Time spent: 40 minutes    Toribio Hummer, MD [facility]   To contact the attending provider between 7A-7P or the covering provider during after hours 7P-7A, please log into the web site [email or web address] and access using universal [place] password for that web site. If you do not have the password, please call the hospital operator.  11/12/2024, 3:32 PM    "

## 2024-11-12 NOTE — ED Notes (Signed)
 Sitter of another patient ran up to primary RN and notified that patient was having seizure-like activity.  Primary RN, secondary RN and Charge RN arrived to bedside. Lasted approx 1 minute, more focal to right side this time.  Patent airway. Dr. Theadore to bedside and verbally ordered 2mg  Ativan  IVP emergently.  Suction at bedside, seizure pads still in place. Primary RN remained at bedside until patient came back to normal mentation, with some repetitiveness.  Pt remains on continuous cardiac, pulse ox and bp monitoring. No respiratory distress noted.  Pending transfer to [facility] for Neuro consult per earlier conversation with Dr. Silvester.

## 2024-11-12 NOTE — ED Notes (Signed)
 Neuro now at bedside discussing possible disposition with patient.

## 2024-11-13 ENCOUNTER — Inpatient Hospital Stay (HOSPITAL_COMMUNITY): Payer: MEDICAID

## 2024-11-13 DIAGNOSIS — T426X6A Underdosing of other antiepileptic and sedative-hypnotic drugs, initial encounter: Secondary | ICD-10-CM

## 2024-11-13 DIAGNOSIS — R569 Unspecified convulsions: Secondary | ICD-10-CM

## 2024-11-13 DIAGNOSIS — F4321 Adjustment disorder with depressed mood: Secondary | ICD-10-CM

## 2024-11-13 DIAGNOSIS — Z87820 Personal history of traumatic brain injury: Secondary | ICD-10-CM

## 2024-11-13 DIAGNOSIS — Z91148 Patient's other noncompliance with medication regimen for other reason: Secondary | ICD-10-CM

## 2024-11-13 LAB — CBC WITH DIFFERENTIAL/PLATELET
Abs Immature Granulocytes: 0.1 K/uL — ABNORMAL HIGH (ref 0.00–0.07)
Basophils Absolute: 0 K/uL (ref 0.0–0.1)
Basophils Relative: 0 %
Eosinophils Absolute: 0.3 K/uL (ref 0.0–0.5)
Eosinophils Relative: 3 %
HCT: 42.3 % (ref 39.0–52.0)
Hemoglobin: 14.6 g/dL (ref 13.0–17.0)
Immature Granulocytes: 1 %
Lymphocytes Relative: 25 %
Lymphs Abs: 2.6 K/uL (ref 0.7–4.0)
MCH: 30.5 pg (ref 26.0–34.0)
MCHC: 34.5 g/dL (ref 30.0–36.0)
MCV: 88.5 fL (ref 80.0–100.0)
Monocytes Absolute: 0.9 K/uL (ref 0.1–1.0)
Monocytes Relative: 9 %
Neutro Abs: 6.3 K/uL (ref 1.7–7.7)
Neutrophils Relative %: 62 %
Platelets: 216 K/uL (ref 150–400)
RBC: 4.78 MIL/uL (ref 4.22–5.81)
RDW: 13 % (ref 11.5–15.5)
WBC: 10.1 K/uL (ref 4.0–10.5)
nRBC: 0 % (ref 0.0–0.2)

## 2024-11-13 LAB — RENAL FUNCTION PANEL
Albumin: 4 g/dL (ref 3.5–5.0)
Anion gap: 10 (ref 5–15)
BUN: 23 mg/dL — ABNORMAL HIGH (ref 6–20)
CO2: 25 mmol/L (ref 22–32)
Calcium: 9.2 mg/dL (ref 8.9–10.3)
Chloride: 101 mmol/L (ref 98–111)
Creatinine, Ser: 0.8 mg/dL (ref 0.61–1.24)
GFR, Estimated: >60 mL/min
Glucose, Bld: 96 mg/dL (ref 70–99)
Phosphorus: 3.2 mg/dL (ref 2.5–4.6)
Potassium: 4.8 mmol/L (ref 3.5–5.1)
Sodium: 136 mmol/L (ref 135–145)

## 2024-11-13 LAB — GLUCOSE, CAPILLARY: Glucose-Capillary: 112 mg/dL — ABNORMAL HIGH (ref 70–99)

## 2024-11-13 LAB — MAGNESIUM: Magnesium: 2.4 mg/dL (ref 1.7–2.4)

## 2024-11-13 MED ORDER — SODIUM CHLORIDE 0.9 % IV SOLN
12.5000 mg | Freq: Four times a day (QID) | INTRAVENOUS | Status: DC | PRN
  Administered 2024-11-13 (×2): 12.5 mg via INTRAVENOUS
  Filled 2024-11-13 (×2): qty 12.5

## 2024-11-13 MED ORDER — HYDROMORPHONE HCL 1 MG/ML IJ SOLN
1.0000 mg | INTRAMUSCULAR | Status: DC | PRN
  Administered 2024-11-13: 1 mg via INTRAVENOUS
  Filled 2024-11-13: qty 1

## 2024-11-13 MED ORDER — HYDROMORPHONE HCL 1 MG/ML IJ SOLN
0.5000 mg | INTRAMUSCULAR | Status: DC | PRN
  Administered 2024-11-13: 0.5 mg via INTRAVENOUS
  Filled 2024-11-13 (×2): qty 0.5

## 2024-11-13 MED ORDER — HYDROMORPHONE HCL 1 MG/ML IJ SOLN
0.5000 mg | INTRAMUSCULAR | Status: DC | PRN
  Administered 2024-11-13: 0.5 mg via INTRAVENOUS
  Filled 2024-11-13: qty 0.5

## 2024-11-13 MED ORDER — PROCHLORPERAZINE EDISYLATE 10 MG/2ML IJ SOLN
10.0000 mg | Freq: Four times a day (QID) | INTRAMUSCULAR | Status: DC | PRN
  Administered 2024-11-13 – 2024-11-14 (×3): 10 mg via INTRAVENOUS
  Filled 2024-11-13 (×3): qty 2

## 2024-11-13 NOTE — Plan of Care (Signed)
  Problem: Clinical Measurements: Goal: Diagnostic test results will improve Outcome: Progressing   Problem: Coping: Goal: Level of anxiety will decrease Outcome: Progressing   Problem: Pain Managment: Goal: General experience of comfort will improve and/or be controlled Outcome: Progressing   Problem: Safety: Goal: Ability to remain free from injury will improve Outcome: Progressing

## 2024-11-13 NOTE — Progress Notes (Addendum)
 "                        PROGRESS NOTE        PATIENT DETAILS Name: Jaime Nixon Age: 29 y.o. Sex: male Date of Birth: 02-Apr-1996 Admit Date: 11/11/2024 Admitting Physician Blease Quiver, MD ERE:[place], No Pcp Per  Brief Summary: Patient is a 28 y.o.  male with prior history of TBI-seizure disorder-cannabinoid hyperemesis syndrome-[facility] with multiple seizures (noncompliant with AED x 2 years).  Evaluated by neurology-started on Depakote -transferred to [facility] for LTM EEG.  Significant events: 01/15>> admit to [facility] at [facility] 01/16>> transferred to [facility] for LTM EEG  Significant studies: 01/15>> CT head: No acute intracranial abnormality 01/15>> CT renal stone study: No acute findings 01/16>> CXR: No PNA 01/16>> MRI brain: No acute CVA  Significant microbiology data: None  Procedures: None  Consults: Neurology  Subjective: When seen earlier this morning-he was awake/alert and denied any complaints-subsequently has had multiple-but brief seizure-like episodes.  LTM EEG being hooked up this morning.  Objective: Vitals: Blood pressure 101/62, pulse 79, temperature 97.6 F (36.4 C), temperature source Oral, resp. rate 12, height 6' (1.829 m), weight 90.7 kg, SpO2 98%.   Exam: Gen Exam:Alert awake-not in any distress HEENT:atraumatic, normocephalic Chest: B/L clear to auscultation anteriorly CVS:S1S2 regular Abdomen:soft non tender, non distended Extremities:no edema Neurology: Non focal Skin: no rash  Pertinent Labs/Radiology:    Latest Ref Rng & Units 11/13/2024    5:55 AM 11/12/2024    5:28 AM 11/11/2024    4:52 PM  CBC  WBC 4.0 - 10.5 K/uL 10.1  12.6  15.9   Hemoglobin 13.0 - 17.0 g/dL 85.3  85.1  84.0   Hematocrit 39.0 - 52.0 % 42.3  43.3  46.3   Platelets 150 - 400 K/uL 216  236  271     Lab Results  Component Value Date   NA 136 11/13/2024   K 4.8 11/13/2024   CL 101 11/13/2024   CO2 25 11/13/2024       Assessment/Plan: Seizure disorder with breakthrough seizures Continue depakote  Multiple breakthrough seizures this morning Discussed with neurology-workup with LTM-they will evaluate and provide further recommendations.  Addendum Hooked up to LTM EEG-per neurology-none of the seizure episodes she has had this morning-correlate with EEG findings.  Per nursing staff-patient is also behaving erratically at times.  Have consulted psychiatry for assistance.  History of cannabinoid hyperemesis syndrome No vomiting overnight Supportive care As needed antiemetics  Chronic abdominal pain Abdominal exam benign-CT imaging negative as well Last BM overnight. As needed narcotics for now  History of TBI Supportive care  Tobacco use Transdermal nicotine  Cessation counseling   Code status:   Code Status: Full Code   DVT Prophylaxis: Place and maintain sequential compression device Start: 11/12/24 1522 SCDs Start: 11/12/24 0424    Family Communication: Spouse-Gabby-[phone] updated 1/17   Disposition Plan: Status is: Inpatient Remains inpatient appropriate because: Severity of illness   Planned Discharge Destination:Home   Diet: Diet Order             Diet Heart Room service appropriate? Yes; Fluid consistency: Thin  Diet effective now                     Antimicrobial agents: Anti-infectives (From admission, onward)    None        MEDICATIONS: Scheduled Meds:  divalproex   500 mg Oral Q12H  feeding supplement  237 mL Oral BID BM   nicotine   14 mg Transdermal Daily   pantoprazole   40 mg Oral Daily   Continuous Infusions:  sodium chloride  125 mL/hr at 11/13/24 0603   PRN Meds:.acetaminophen  **OR** acetaminophen , HYDROcodone -acetaminophen , HYDROmorphone  (DILAUDID ) injection, LORazepam , metoCLOPramide  (REGLAN ) injection, ondansetron  **OR** ondansetron  (ZOFRAN ) IV, sucralfate , traZODone    I have personally reviewed following labs and imaging  studies  LABORATORY DATA: CBC: Recent Labs  Lab 11/10/24 0633 11/11/24 1652 11/12/24 0528 11/13/24 0555  WBC 19.3* 15.9* 12.6* 10.1  NEUTROABS  --  12.0*  --  6.3  HGB 15.9 15.9 14.8 14.6  HCT 47.2 46.3 43.3 42.3  MCV 88.6 87.5 90.4 88.5  PLT 318 271 236 216    Basic Metabolic Panel: Recent Labs  Lab 11/10/24 0633 11/11/24 1652 11/12/24 0528 11/13/24 0555  NA 140 138 140 136  K 3.9 3.5 3.2* 4.8  CL 101 100 109 101  CO2 19* 24 19* 25  GLUCOSE 137* 112* 96 96  BUN 22* 20 22* 23*  CREATININE 0.85 0.82 0.75 0.80  CALCIUM 10.9* 9.8 7.3* 9.2  MG  --  2.7* 2.5* 2.4  PHOS  --  4.1 3.4 3.2    GFR: Estimated Creatinine Clearance: 148.2 mL/min (by C-G formula based on SCr of 0.8 mg/dL).  Liver Function Tests: Recent Labs  Lab 11/10/24 0633 11/11/24 1652 11/12/24 0528 11/13/24 0555  AST 30 28 22   --   ALT 17 13 9   --   ALKPHOS 120 111 80  --   BILITOT 0.8 0.7 0.3  --   PROT 8.7* 8.1 5.8*  --   ALBUMIN 5.0 4.6 3.6 4.0   Recent Labs  Lab 11/10/24 0633  LIPASE 26   No results for input(s): AMMONIA in the last 168 hours.  Coagulation Profile: No results for input(s): INR, PROTIME in the last 168 hours.  Cardiac Enzymes: Recent Labs  Lab 11/11/24 1652  CKTOTAL 125    BNP (last 3 results) No results for input(s): PROBNP in the last 8760 hours.  Lipid Profile: No results for input(s): CHOL, HDL, LDLCALC, TRIG, CHOLHDL, LDLDIRECT in the last 72 hours.  Thyroid  Function Tests: No results for input(s): TSH, T4TOTAL, FREET4, T3FREE, THYROIDAB in the last 72 hours.  Anemia Panel: No results for input(s): VITAMINB12, FOLATE, FERRITIN, TIBC, IRON, RETICCTPCT in the last 72 hours.  Urine analysis:    Component Value Date/Time   COLORURINE YELLOW 11/11/2024 1716   APPEARANCEUR CLEAR 11/11/2024 1716   LABSPEC 1.030 11/11/2024 1716   PHURINE 5.0 11/11/2024 1716   GLUCOSEU NEGATIVE 11/11/2024 1716   HGBUR NEGATIVE  11/11/2024 1716   BILIRUBINUR NEGATIVE 11/11/2024 1716   KETONESUR NEGATIVE 11/11/2024 1716   PROTEINUR NEGATIVE 11/11/2024 1716   UROBILINOGEN 0.2 06/01/2015 0503   NITRITE NEGATIVE 11/11/2024 1716   LEUKOCYTESUR NEGATIVE 11/11/2024 1716    Sepsis Labs: Lactic Acid, Venous    Component Value Date/Time   LATICACIDVEN 0.9 09/10/2023 2113    MICROBIOLOGY: No results found for this or any previous visit (from the past 240 hours).  RADIOLOGY STUDIES/RESULTS: MR BRAIN W WO CONTRAST Result Date: 11/12/2024 CLINICAL DATA:  Seizure disorder, clinical chain EXAM: MRI HEAD WITHOUT AND WITH CONTRAST TECHNIQUE: Multiplanar, multiecho pulse sequences of the brain and surrounding structures were obtained without and with intravenous contrast. CONTRAST:  9mL GADAVIST  GADOBUTROL  1 MMOL/ML IV SOLN COMPARISON:  December 26, 2015 FINDINGS: MRI brain: The brain is structurally normal. The mesial temporal lobes are normal and symmetric.  The signal in the brain parenchyma is normal. No abnormal enhancement. There is no acute or chronic infarct. The ventricles are normal. No mass lesion. There are normal flow signals in the carotid arteries and basilar artery. No significant bone marrow signal abnormality. No significant abnormality in the paranasal sinuses or soft tissues. IMPRESSION: Normal Electronically Signed   By: Nancyann Burns M.D.   On: 11/12/2024 10:06   DG CHEST PORT 1 VIEW Result Date: 11/12/2024 EXAM: 1 VIEW(S) XRAY OF THE CHEST 11/12/2024 09:02:00 AM COMPARISON: 10/15/2024 CLINICAL HISTORY: Leukocytosis FINDINGS: LUNGS AND PLEURA: No focal pulmonary opacity. No pleural effusion. No pneumothorax. HEART AND MEDIASTINUM: No acute abnormality of the cardiac and mediastinal silhouettes. BONES AND SOFT TISSUES: No acute osseous abnormality. IMPRESSION: 1. No acute process. Electronically signed by: Waddell Calk MD 11/12/2024 09:21 AM EST RP Workstation: [id number]   CT Head Wo Contrast Result Date:  11/11/2024 EXAM: CT HEAD WITHOUT CONTRAST 11/11/2024 04:40:31 PM TECHNIQUE: CT of the head was performed without the administration of intravenous contrast. Automated exposure control, iterative reconstruction, and/or weight based adjustment of the mA/kV was utilized to reduce the radiation dose to as low as reasonably achievable. COMPARISON: None available. CLINICAL HISTORY: Memory loss Memory loss Memory loss Memory loss Memory loss FINDINGS: BRAIN AND VENTRICLES: No acute hemorrhage. No evidence of acute infarct. No hydrocephalus. No extra-axial collection. No mass effect or midline shift. ORBITS: No acute abnormality. SINUSES: No acute abnormality. SOFT TISSUES AND SKULL: No acute soft tissue abnormality. No skull fracture. IMPRESSION: 1. No acute intracranial abnormality. Electronically signed by: Donnice Mania MD 11/11/2024 04:52 PM EST RP Workstation: [id number]   CT Renal Stone Study Result Date: 11/11/2024 EXAM: CT ABDOMEN AND PELVIS WITHOUT CONTRAST 11/11/2024 04:40:31 PM TECHNIQUE: CT of the abdomen and pelvis was performed without the administration of intravenous contrast. Multiplanar reformatted images are provided for review. Automated exposure control, iterative reconstruction, and/or weight-based adjustment of the mA/kV was utilized to reduce the radiation dose to as low as reasonably achievable. COMPARISON: 10/17/2024 CLINICAL HISTORY: Abdominal/flank pain, stone suspected. FINDINGS: LOWER CHEST: No acute abnormality. LIVER: The liver is unremarkable. GALLBLADDER AND BILE DUCTS: Gallbladder is unremarkable. No biliary ductal dilatation. SPLEEN: No acute abnormality. PANCREAS: No acute abnormality. ADRENAL GLANDS: No acute abnormality. KIDNEYS, URETERS AND BLADDER: No stones in the kidneys or ureters. No hydronephrosis. No perinephric or periureteral stranding. Urinary bladder is unremarkable. GI AND BOWEL: Stomach demonstrates no acute abnormality. The appendix is visualized and normal in  caliber, without wall thickening, periappendiceal inflammation, or fluid. There is no bowel obstruction. PERITONEUM AND RETROPERITONEUM: No ascites. No free air. No free fluid or fluid collections. VASCULATURE: Aorta is normal in caliber. LYMPH NODES: No lymphadenopathy. REPRODUCTIVE ORGANS: Prostate gland appears normal. BONES AND SOFT TISSUES: No acute osseous abnormality. Small fat-containing umbilical hernia. No focal soft tissue abnormality. IMPRESSION: 1. No acute findings in the abdomen or pelvis. Electronically signed by: Waddell Calk MD 11/11/2024 04:51 PM EST RP Workstation: [id number]     LOS: 1 day   Donalda Applebaum, MD  [facility]    To contact the attending provider between 7A-7P or the covering provider during after hours 7P-7A, please log into the web site [email or web address] and access using universal [place] password for that web site. If you do not have the password, please call the hospital operator.  11/13/2024, 9:01 AM    "
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
## 2024-11-13 NOTE — Progress Notes (Signed)
 NEUROLOGY CONSULT FOLLOW UP NOTE   Date of service: November 13, 2024 Patient Name: Jaime Nixon MRN:  [id number] DOB:  [birth year]  Interval Hx/subjective   Patient transferred from [facility] late yesterday afternoon, LTM hooked up this morning with seizure-like episodes occurring.   On exam, awake, oriented, MAE, denies headache.  Vitals   Vitals:   11/13/24 0000 11/13/24 0023 11/13/24 0100 11/13/24 0425  BP:  96/65  101/62  Pulse:  64  79  Resp: 15  17 12   Temp:  98 F (36.7 C)  97.6 F (36.4 C)  TempSrc:  Axillary  Oral  SpO2:    98%  Weight:      Height:         Body mass index is 27.12 kg/m.  Physical Exam   Constitutional: Appears well-developed and well-nourished.  Psych: Flat affect  Neurologic Examination   Neuro: Mental Status: Patient is awake, alert, oriented to person, place, month, year, and situation. Patient is able to give a clear and coherent history. No signs of aphasia or neglect Cranial Nerves: II to XII grossly intact.  Motor: Tone is normal. Bulk is normal. 5/5 strength was present in all four extremities.  Sensory: Sensation is symmetric to light touch in the arms and legs. Cerebellar: FNF intact bilaterally. No overt ataxia.    Medications Current Medications[1]  Labs and Diagnostic Imaging   CBC:  Recent Labs  Lab 11/11/24 1652 11/12/24 0528 11/13/24 0555  WBC 15.9* 12.6* 10.1  NEUTROABS 12.0*  --  6.3  HGB 15.9 14.8 14.6  HCT 46.3 43.3 42.3  MCV 87.5 90.4 88.5  PLT 271 236 216    Basic Metabolic Panel:  Lab Results  Component Value Date   NA 136 11/13/2024   K 4.8 11/13/2024   CO2 25 11/13/2024   GLUCOSE 96 11/13/2024   BUN 23 (H) 11/13/2024   CREATININE 0.80 11/13/2024   CALCIUM 9.2 11/13/2024   GFRNONAA >60 11/13/2024   GFRAA >60 05/29/2020   Lipid Panel:  Lab Results  Component Value Date   LDLCALC 149 (H) 05/15/2023   HgbA1c:  Lab Results  Component Value Date   HGBA1C 5.5 05/15/2023   Urine Drug  Screen:     Component Value Date/Time   LABOPIA NEGATIVE 11/11/2024 1716   COCAINSCRNUR NEGATIVE 11/11/2024 1716   LABBENZ NEGATIVE 11/11/2024 1716   AMPHETMU NEGATIVE 11/11/2024 1716   THCU POSITIVE (A) 11/11/2024 1716   LABBARB NEGATIVE 11/11/2024 1716    Alcohol Level     Component Value Date/Time   ETH <15 10/17/2024 0404   INR No results found for: INR APTT No results found for: APTT AED levels: No results found for: PHENYTOIN, ZONISAMIDE, LAMOTRIGINE, LEVETIRACETA  CT Head without contrast(Personally reviewed): Negative  MRI Brain(Personally reviewed): Negative    Assessment   Jaime Nixon is a 29 y.o. male with history of TBI, seizures (since 2017) with medication non-compliance (stopped approx 2 years ago), cannbinoid hyperemesis, chronic abdominal pain who presented to [facility] d/t multiple recurrent seizures x2 days and n/v x2 weeks. He was started on Depakote , but continued to have seizures. MRI negative. He was transferred to [facility] for cEEG.   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   I have reviewed the contents of history and physical exam as documented by PA/ARNP/Resident and agree with above documentation.  I have discussed and formulated the above plan as documented. Edits to the note have been made as needed.  Galen Russman, MD [facility] [phone]   If 7pm to 7am, please call on call as listed on AMION.     [1]  Current Facility-Administered Medications:    0.9 %  sodium chloride  infusion, , Intravenous, Continuous, Sebastian Toribio GAILS, MD, Last Rate: 125 mL/hr at 11/13/24 0603, Infusion Verify at 11/13/24 9396   acetaminophen  (TYLENOL ) tablet 650 mg, 650 mg, Oral, Q6H PRN **OR** acetaminophen  (TYLENOL ) suppository 650 mg, 650 mg, Rectal, Q6H PRN, Doutova, Anastassia, MD   divalproex  (DEPAKOTE ) DR tablet 500 mg, 500 mg, Oral, Q12H, Doutova, Anastassia, MD, 500 mg at 11/12/24 2126   feeding supplement (ENSURE PLUS HIGH PROTEIN) liquid 237 mL, 237 mL, Oral, BID BM, Dennise, Prashant K, MD   HYDROcodone -acetaminophen  (NORCO/VICODIN) 5-325 MG per tablet 1-2 tablet, 1-2 tablet, Oral, Q4H PRN, Doutova, Anastassia, MD, 1 tablet at 11/12/24 1720   HYDROmorphone  (DILAUDID ) injection 0.5 mg, 0.5 mg, Intravenous, Q4H PRN, Ghimire, Shanker M, MD, 0.5 mg at 11/13/24 9166   LORazepam  (ATIVAN ) injection 2 mg, 2 mg, Intravenous, Q4H PRN, Sebastian Toribio GAILS, MD, 2 mg at 11/13/24 0805   metoCLOPramide  (REGLAN ) injection 5 mg, 5 mg, Intravenous, Q6H PRN, Doutova, Anastassia, MD   nicotine  (NICODERM CQ  - dosed in mg/24 hours) patch 14 mg, 14 mg, Transdermal, Daily, Doutova, Anastassia, MD, 14 mg at 11/12/24 1022   ondansetron  (ZOFRAN ) tablet 4 mg, 4 mg, Oral, Q6H PRN **OR** ondansetron  (ZOFRAN ) injection 4 mg, 4 mg, Intravenous, Q6H PRN, Doutova, Anastassia, MD, 4 mg at 11/13/24 0753   pantoprazole  (PROTONIX ) EC tablet 40 mg, 40 mg, Oral, Daily, Doutova, Anastassia, MD, 40 mg at 11/12/24 1022   sucralfate  (CARAFATE ) tablet 1 g, 1 g, Oral, TID PRN, Doutova, Anastassia, MD   traZODone  (DESYREL ) tablet 50 mg, 50 mg, Oral, QHS PRN, Opyd, Timothy S, MD, 50 mg at 11/12/24 2127

## 2024-11-13 NOTE — Plan of Care (Signed)

## 2024-11-13 NOTE — Progress Notes (Signed)
 LTM EEG hooked up and running - no initial skin breakdown - push button tested - [facility] monitoring.

## 2024-11-14 ENCOUNTER — Other Ambulatory Visit (HOSPITAL_COMMUNITY): Payer: Self-pay

## 2024-11-14 ENCOUNTER — Inpatient Hospital Stay (HOSPITAL_COMMUNITY): Payer: MEDICAID

## 2024-11-14 LAB — CBC
HCT: 43.4 % (ref 39.0–52.0)
Hemoglobin: 15.5 g/dL (ref 13.0–17.0)
MCH: 30.9 pg (ref 26.0–34.0)
MCHC: 35.7 g/dL (ref 30.0–36.0)
MCV: 86.5 fL (ref 80.0–100.0)
Platelets: 238 K/uL (ref 150–400)
RBC: 5.02 MIL/uL (ref 4.22–5.81)
RDW: 12.8 % (ref 11.5–15.5)
WBC: 15.1 K/uL — ABNORMAL HIGH (ref 4.0–10.5)
nRBC: 0 % (ref 0.0–0.2)

## 2024-11-14 LAB — BASIC METABOLIC PANEL WITH GFR
Anion gap: 12 (ref 5–15)
BUN: 15 mg/dL (ref 6–20)
CO2: 24 mmol/L (ref 22–32)
Calcium: 9.6 mg/dL (ref 8.9–10.3)
Chloride: 99 mmol/L (ref 98–111)
Creatinine, Ser: 0.71 mg/dL (ref 0.61–1.24)
GFR, Estimated: >60 mL/min
Glucose, Bld: 109 mg/dL — ABNORMAL HIGH (ref 70–99)
Potassium: 4.1 mmol/L (ref 3.5–5.1)
Sodium: 136 mmol/L (ref 135–145)

## 2024-11-14 MED ORDER — SERTRALINE HCL 50 MG PO TABS
50.0000 mg | ORAL_TABLET | Freq: Every day | ORAL | 1 refills | Status: AC
  Filled 2024-11-14: qty 30, 30d supply, fill #0

## 2024-11-14 MED ORDER — PANTOPRAZOLE SODIUM 40 MG PO TBEC
40.0000 mg | DELAYED_RELEASE_TABLET | Freq: Every day | ORAL | 1 refills | Status: AC
  Filled 2024-11-14: qty 30, 30d supply, fill #0

## 2024-11-14 MED ORDER — PROCHLORPERAZINE MALEATE 10 MG PO TABS
10.0000 mg | ORAL_TABLET | Freq: Four times a day (QID) | ORAL | 0 refills | Status: AC | PRN
  Filled 2024-11-14: qty 30, 8d supply, fill #0

## 2024-11-14 MED ORDER — DIVALPROEX SODIUM 500 MG PO DR TAB
500.0000 mg | DELAYED_RELEASE_TABLET | Freq: Two times a day (BID) | ORAL | 1 refills | Status: AC
  Filled 2024-11-14: qty 60, 30d supply, fill #0

## 2024-11-14 NOTE — Progress Notes (Signed)
 Patient has a discharge order but him along with his wife refused to wait for the [facility] medicines and left the floor telling will pick the medicine on the way out.RN made aware that the medication are not ready yet.RN educated he might need WC to go down but he refused.

## 2024-11-14 NOTE — Plan of Care (Signed)

## 2024-11-14 NOTE — Discharge Summary (Signed)
 "  PATIENT DETAILS Name: Jaime Nixon Age: 29 y.o. Sex: male Date of Birth: Sep 15, 1996 MRN: [id number]. Admitting Physician: Anastassia Doutova, MD ERE:E[place]u, No Pcp Per  Admit Date: 11/11/2024 Discharge date: 11/14/2024  Recommendations for Outpatient Follow-up:  Follow up with PCP in 1-2 weeks Please obtain CMP/CBC in one week  Admitted From:  Home  Disposition: Home   Discharge Condition: good  CODE STATUS:   Code Status: Full Code   Diet recommendation:  Diet Order             Diet Heart Room service appropriate? Yes; Fluid consistency: Thin  Diet effective now                    Brief Summary: Patient is a 29 y.o.  male with prior history of TBI-seizure disorder-cannabinoid hyperemesis syndrome-presented to [facility] on 1/15 with multiple seizures (noncompliant with AED x 2 years-previously on Keppra ).  Evaluated by neurology-started on Depakote -transferred to [facility] for LTM EEG.   Significant events: 01/15>> admit to TRH at [facility] 01/16>> transferred to [facility] for LTM EEG   Significant studies: 01/15>> CT head: No acute intracranial abnormality 01/15>> CT renal stone study: No acute findings 01/16>> CXR: No PNA 01/16>> MRI brain: No acute CVA   Significant microbiology data: None   Procedures: None   Consults: Neurology  Brief Hospital Course: Seizure disorder with breakthrough seizures ?  Pseudoseizures History of known seizures-previously on Keppra -noncompliant x 2 years Evaluated by neurology closely-neuroimaging was negative-placed on LTM-no EEG correlation with seizure-like activity (had multiple seizure-like activity while on LTM EEG). Discussed with neurologist-Dr Khaliqdina-on 1/18-okay for discharge-continue Depakote  and ensure outpatient follow-up with neurology Evaluated by psychiatry-recommended to continue Depakote  and add Zoloft . See below regarding standard seizure restrictions/precautions.   History of  cannabinoid hyperemesis syndrome No vomiting overnight-tolerating diet this morning-abdominal exam benign. Supportive care As needed antiemetics   Chronic abdominal pain Abdominal exam benign-CT imaging negative as well Last BM overnight. Given as needed narcotics Abdominal exam is benign-he has been having ongoing pain issues for the past several months with vomiting-suspect he first needs to stop using marijuana/cannabinoid products consistently and see if he improves.  He also claims he has an upcoming GI appointment as well.  I have encouraged him to keep this appointment.   History of TBI Supportive care   Tobacco use Transdermal nicotine  Cessation counseling    Discharge Diagnoses:  Principal Problem:   Epilepsy (HCC) Active Problems:   Leukocytosis   Cannabinoid hyperemesis syndrome   Generalized abdominal pain   Traumatic brain injury (HCC)   Tobacco abuse   Hypokalemia   Adjustment disorder with depressed mood   Discharge Instructions:  Activity:  As tolerated   Discharge Instructions     Ambulatory referral to Neurology   Complete by: As directed    An appointment is requested in approximately: 4 weeks   Call MD for:  persistant nausea and vomiting   Complete by: As directed    Call MD for:  severe uncontrolled pain   Complete by: As directed    Discharge instructions   Complete by: As directed    Follow with Primary MD   in 1-2 weeks  Please get a complete blood count and chemistry panel checked by your Primary MD at your next visit, and again as instructed by your Primary MD.  Get Medicines reviewed and adjusted: Please take all your medications with you for your next visit with your Primary MD  Laboratory/radiological data: Please request your Primary MD to go over all hospital tests and procedure/radiological results at the follow up, please ask your Primary MD to get all Hospital records sent to his/her office.  In some cases, they will be  blood work, cultures and biopsy results pending at the time of your discharge. Please request that your primary care M.D. follows up on these results.  Also Note the following: If you experience worsening of your admission symptoms, develop shortness of breath, life threatening emergency, suicidal or homicidal thoughts you must seek medical attention immediately by calling 911 or calling your MD immediately  if symptoms less severe.  You must read complete instructions/literature along with all the possible adverse reactions/side effects for all the Medicines you take and that have been prescribed to you. Take any new Medicines after you have completely understood and accpet all the possible adverse reactions/side effects.   Do not drive when taking Pain medications or sleeping medications (Benzodaizepines)  Do not take more than prescribed Pain, Sleep and Anxiety Medications. It is not advisable to combine anxiety,sleep and pain medications without talking with your primary care practitioner  Special Instructions: If you have smoked or chewed Tobacco  in the last 2 yrs please stop smoking, stop any regular Alcohol  and or any Recreational drug use.  Wear Seat belts while driving.  Please note: You were cared for by a hospitalist during your hospital stay. Once you are discharged, your primary care physician will handle any further medical issues. Please note that NO REFILLS for any discharge medications will be authorized once you are discharged, as it is imperative that you return to your primary care physician (or establish a relationship with a primary care physician if you do not have one) for your post hospital discharge needs so that they can reassess your need for medications and monitor your lab values.     Seizure precautions: Per [place]  DMV statutes, patients with seizures are not allowed to drive until they have been seizure-free for six months and cleared by a physician     Use caution when using heavy equipment or power tools. Avoid working on ladders or at heights. Take showers instead of baths. Ensure the water temperature is not too high on the home water heater. Do not go swimming alone. Do not lock yourself in a room alone (i.e. bathroom). When caring for infants or small children, sit down when holding, feeding, or changing them to minimize risk of injury to the child in the event you have a seizure. Maintain good sleep hygiene. Avoid alcohol.    If patient has another seizure, call 911 and bring them back to the ED if: A.  The seizure lasts longer than 5 minutes.      B.  The patient doesn't wake shortly after the seizure or has new problems such as difficulty seeing, speaking or moving following the seizure C.  The patient was injured during the seizure D.  The patient has a temperature over 102 F (39C) E.  The patient vomited during the seizure and now is having trouble breathing    During the Seizure   - First, ensure adequate ventilation and place patients on the floor on their left side  Loosen clothing around the neck and ensure the airway is patent. If the patient is clenching the teeth, do not force the mouth open with any object as this can cause severe damage - Remove all items from the surrounding that can  be hazardous. The patient may be oblivious to what's happening and may not even know what he or she is doing. If the patient is confused and wandering, either gently guide him/her away and block access to outside areas - Reassure the individual and be comforting - Call 911. In most cases, the seizure ends before EMS arrives. However, there are cases when seizures may last over 3 to 5 minutes. Or the individual may have developed breathing difficulties or severe injuries. If a pregnant patient or a person with diabetes develops a seizure, it is prudent to call an ambulance. - Finally, if the patient does not regain full consciousness, then call  EMS. Most patients will remain confused for about 45 to 90 minutes after a seizure, so you must use judgment in calling for help. - Avoid restraints but make sure the patient is in a bed with padded side rails - Place the individual in a lateral position with the neck slightly flexed; this will help the saliva drain from the mouth and prevent the tongue from falling backward - Remove all nearby furniture and other hazards from the area - Provide verbal assurance as the individual is regaining consciousness - Provide the patient with privacy if possible - Call for help and start treatment as ordered by the caregiver    After the Seizure (Postictal Stage)   After a seizure, most patients experience confusion, fatigue, muscle pain and/or a headache. Thus, one should permit the individual to sleep. For the next few days, reassurance is essential. Being calm and helping reorient the person is also of importance.   Most seizures are painless and end spontaneously. Seizures are not harmful to others but can lead to complications such as stress on the lungs, brain and the heart. Individuals with prior lung problems may develop labored breathing and respiratory distress.    Increase activity slowly   Complete by: As directed       Allergies as of 11/14/2024       Reactions   Dexamethasone  Shortness Of Breath, Other (See Comments)   Makes pt angry    Prednisone Other (See Comments)   Makes pt angry         Medication List     STOP taking these medications    acetaminophen  500 MG tablet Commonly known as: TYLENOL    omeprazole  20 MG capsule Commonly known as: PRILOSEC   ondansetron  4 MG disintegrating tablet Commonly known as: ZOFRAN -ODT   sucralfate  1 g tablet Commonly known as: Carafate        TAKE these medications    divalproex  500 MG DR tablet Commonly known as: DEPAKOTE  Take 1 tablet (500 mg total) by mouth every 12 (twelve) hours.   pantoprazole  40 MG tablet Commonly  known as: Protonix  Take 1 tablet (40 mg total) by mouth daily.   prochlorperazine  10 MG tablet Commonly known as: COMPAZINE  Take 1 tablet (10 mg total) by mouth every 6 (six) hours as needed for refractory nausea / vomiting.   sertraline  50 MG tablet Commonly known as: Zoloft  Take 1 tablet (50 mg total) by mouth daily.        Allergies[1]   Other Procedures/Studies: Overnight EEG with video Result Date: 11/14/2024 Shelton Arlin KIDD, MD     11/14/2024  9:29 AM Patient Name: AARIT KASHUBA MRN: [id number] Epilepsy Attending: Arlin KIDD Shelton Referring Physician/Provider: Khaliqdina, Salman, MD Duration: 11/13/2024 9158 to 11/14/2024 0841 Patient history:  29 y.o. male with history of TBI, seizures (since 2017) with  medication non-compliance (stopped approx 2 years ago), cannbinoid hyperemesis, chronic abdominal pain who presented to [facility] d/t multiple recurrent seizures x2 days and n/v x2 weeks. EEG to evaluate for seizure Level of alertness: Awake, asleep AEDs during EEG study: None Technical aspects: This EEG study was done with scalp electrodes positioned according to the 10-20 International system of electrode placement. Electrical activity was reviewed with band pass filter of 1-70Hz , sensitivity of 7 uV/mm, display speed of 21mm/sec with a 60Hz  notched filter applied as appropriate. EEG data were recorded continuously and digitally stored.  Video monitoring was available and reviewed as appropriate. Description: The posterior dominant rhythm consists of 9 Hz activity of moderate voltage (25-35 uV) seen predominantly in posterior head regions, symmetric and reactive to eye opening and eye closing. Sleep was characterized by vertex waves, sleep spindles (12 to 14 Hz), maximal frontocentral region. Event button was pressed on 11/13/2024 at 0846, 1006, 1013, 1507, 1524, 1537, 1843, 1855, 1900 and on 11/14/2024 0141. Patient had bilateral arm and legs stiffening and extension, followed by arm jerking.  Concomitant EEG before, during and after the event showed normal posterior dominant rhythm and did not show any EEG changes suggest seizure. Hyperventilation and photic stimulation were not performed.   IMPRESSION: This study is within normal limits. No seizures or epileptiform discharges were seen throughout the recording. Multiple events were recorded as described above without concomitant EEG change. These events were NON epileptic. A normal interictal EEG does not exclude the diagnosis of epilepsy. Arlin MALVA Krebs   MR BRAIN W WO CONTRAST Result Date: 11/12/2024 CLINICAL DATA:  Seizure disorder, clinical chain EXAM: MRI HEAD WITHOUT AND WITH CONTRAST TECHNIQUE: Multiplanar, multiecho pulse sequences of the brain and surrounding structures were obtained without and with intravenous contrast. CONTRAST:  9mL GADAVIST  GADOBUTROL  1 MMOL/ML IV SOLN COMPARISON:  December 26, 2015 FINDINGS: MRI brain: The brain is structurally normal. The mesial temporal lobes are normal and symmetric. The signal in the brain parenchyma is normal. No abnormal enhancement. There is no acute or chronic infarct. The ventricles are normal. No mass lesion. There are normal flow signals in the carotid arteries and basilar artery. No significant bone marrow signal abnormality. No significant abnormality in the paranasal sinuses or soft tissues. IMPRESSION: Normal Electronically Signed   By: Nancyann Burns M.D.   On: 11/12/2024 10:06   DG CHEST PORT 1 VIEW Result Date: 11/12/2024 EXAM: 1 VIEW(S) XRAY OF THE CHEST 11/12/2024 09:02:00 AM COMPARISON: 10/15/2024 CLINICAL HISTORY: Leukocytosis FINDINGS: LUNGS AND PLEURA: No focal pulmonary opacity. No pleural effusion. No pneumothorax. HEART AND MEDIASTINUM: No acute abnormality of the cardiac and mediastinal silhouettes. BONES AND SOFT TISSUES: No acute osseous abnormality. IMPRESSION: 1. No acute process. Electronically signed by: Waddell Calk MD 11/12/2024 09:21 AM EST RP Workstation:  [id number]   CT Head Wo Contrast Result Date: 11/11/2024 EXAM: CT HEAD WITHOUT CONTRAST 11/11/2024 04:40:31 PM TECHNIQUE: CT of the head was performed without the administration of intravenous contrast. Automated exposure control, iterative reconstruction, and/or weight based adjustment of the mA/kV was utilized to reduce the radiation dose to as low as reasonably achievable. COMPARISON: None available. CLINICAL HISTORY: Memory loss Memory loss Memory loss Memory loss Memory loss FINDINGS: BRAIN AND VENTRICLES: No acute hemorrhage. No evidence of acute infarct. No hydrocephalus. No extra-axial collection. No mass effect or midline shift. ORBITS: No acute abnormality. SINUSES: No acute abnormality. SOFT TISSUES AND SKULL: No acute soft tissue abnormality. No skull fracture. IMPRESSION: 1. No acute intracranial abnormality. Electronically signed  by: Donnice Mania MD 11/11/2024 04:52 PM EST RP Workstation: [id number]   CT Renal Stone Study Result Date: 11/11/2024 EXAM: CT ABDOMEN AND PELVIS WITHOUT CONTRAST 11/11/2024 04:40:31 PM TECHNIQUE: CT of the abdomen and pelvis was performed without the administration of intravenous contrast. Multiplanar reformatted images are provided for review. Automated exposure control, iterative reconstruction, and/or weight-based adjustment of the mA/kV was utilized to reduce the radiation dose to as low as reasonably achievable. COMPARISON: 10/17/2024 CLINICAL HISTORY: Abdominal/flank pain, stone suspected. FINDINGS: LOWER CHEST: No acute abnormality. LIVER: The liver is unremarkable. GALLBLADDER AND BILE DUCTS: Gallbladder is unremarkable. No biliary ductal dilatation. SPLEEN: No acute abnormality. PANCREAS: No acute abnormality. ADRENAL GLANDS: No acute abnormality. KIDNEYS, URETERS AND BLADDER: No stones in the kidneys or ureters. No hydronephrosis. No perinephric or periureteral stranding. Urinary bladder is unremarkable. GI AND BOWEL: Stomach demonstrates no acute abnormality.  The appendix is visualized and normal in caliber, without wall thickening, periappendiceal inflammation, or fluid. There is no bowel obstruction. PERITONEUM AND RETROPERITONEUM: No ascites. No free air. No free fluid or fluid collections. VASCULATURE: Aorta is normal in caliber. LYMPH NODES: No lymphadenopathy. REPRODUCTIVE ORGANS: Prostate gland appears normal. BONES AND SOFT TISSUES: No acute osseous abnormality. Small fat-containing umbilical hernia. No focal soft tissue abnormality. IMPRESSION: 1. No acute findings in the abdomen or pelvis. Electronically signed by: Waddell Calk MD 11/11/2024 04:51 PM EST RP Workstation: [id number]   CT Angio Abd/Pel W and/or Wo Contrast Result Date: 10/17/2024 EXAM: CTA ABDOMEN AND PELVIS WITHOUT AND WITH CONTRAST 10/17/2024 03:18:46 AM TECHNIQUE: CTA images of the abdomen and pelvis without and with intravenous contrast. 100 mL (iohexol  (OMNIPAQUE ) 350 MG/ML injection 100 mL IOHEXOL  350 MG/ML SOLN) was administered. Three-dimensional MIP/volume rendered formations were performed. Automated exposure control, iterative reconstruction, and/or weight based adjustment of the mA/kV was utilized to reduce the radiation dose to as low as reasonably achievable. COMPARISON: 10/14/2024 CLINICAL HISTORY: Lower GI bleed FINDINGS: VASCULATURE: No acute finding. AORTA: No acute finding. No abdominal aortic aneurysm. No dissection. CELIAC TRUNK: No acute finding. No occlusion or significant stenosis. SUPERIOR MESENTERIC ARTERY: No acute finding. No occlusion or significant stenosis. RENAL ARTERIES: No acute finding. No occlusion or significant stenosis. ILIAC ARTERIES: No acute finding. No occlusion or significant stenosis. LIVER: The liver is unremarkable. GALLBLADDER AND BILE DUCTS: Gallbladder is unremarkable. No biliary ductal dilatation. SPLEEN: The spleen is unremarkable. PANCREAS: The pancreas is unremarkable. ADRENAL GLANDS: Bilateral adrenal glands demonstrate no acute  abnormality. KIDNEYS, URETERS AND BLADDER: No stones in the kidneys or ureters. No hydronephrosis. No perinephric or periureteral stranding. Urinary bladder is unremarkable. GI AND BOWEL: The appendix is within normal limits. No findings to suggest acute GI hemorrhage are noted. No obstructive changes of the colon are seen. The stomach and small bowel are within normal limits. There is no bowel obstruction. No abnormal bowel wall thickening or distension. REPRODUCTIVE: The prostate is within normal limits. PERITONEUM AND RETRPERITONEUM: No ascites or free air. LUNG BASE: No acute abnormality. LYMPH NODES: No lymphadenopathy. BONES AND SOFT TISSUES: No acute abnormality of the bones. No acute soft tissue abnormality. IMPRESSION: 1. No findings to suggest acute GI hemorrhage. Electronically signed by: Oneil Devonshire MD 10/17/2024 03:27 AM EST RP Workstation: [id number]     TODAY-DAY OF DISCHARGE:  Subjective:   Debby Benders today has no headache,no chest abdominal pain,no new weakness tingling or numbness, feels much better wants to go home today.   Objective:   Blood pressure (!) 132/92, pulse 91, temperature 98.4 F (36.9  C), temperature source Axillary, resp. rate 14, height 6' (1.829 m), weight 90.7 kg, SpO2 96%.  Intake/Output Summary (Last 24 hours) at 11/14/2024 1000 Last data filed at 11/13/2024 1842 Gross per 24 hour  Intake 100 ml  Output 450 ml  Net -350 ml   Filed Weights   11/11/24 2008  Weight: 90.7 kg    Exam: Awake Alert, Oriented *3, No new F.N deficits, Normal affect [place].AT,PERRAL Supple Neck,No JVD, No cervical lymphadenopathy appriciated.  Symmetrical Chest wall movement, Good air movement bilaterally, CTAB RRR,No Gallops,Rubs or new Murmurs, No Parasternal Heave +ve B.Sounds, Abd Soft, Non tender, No organomegaly appriciated, No rebound -guarding or rigidity. No Cyanosis, Clubbing or edema, No new Rash or bruise   PERTINENT RADIOLOGIC STUDIES: Overnight EEG with  video Result Date: 11/14/2024 Shelton Arlin KIDD, MD     11/14/2024  9:29 AM Patient Name: FLAVIUS REPSHER MRN: [id number] Epilepsy Attending: Arlin KIDD Shelton Referring Physician/Provider: Khaliqdina, Salman, MD Duration: 11/13/2024 0841 to 11/14/2024 0841 Patient history:  29 y.o. male with history of TBI, seizures (since 2017) with medication non-compliance (stopped approx 2 years ago), cannbinoid hyperemesis, chronic abdominal pain who presented to [facility] d/t multiple recurrent seizures x2 days and n/v x2 weeks. EEG to evaluate for seizure Level of alertness: Awake, asleep AEDs during EEG study: None Technical aspects: This EEG study was done with scalp electrodes positioned according to the 10-20 International system of electrode placement. Electrical activity was reviewed with band pass filter of 1-70Hz , sensitivity of 7 uV/mm, display speed of 34mm/sec with a 60Hz  notched filter applied as appropriate. EEG data were recorded continuously and digitally stored.  Video monitoring was available and reviewed as appropriate. Description: The posterior dominant rhythm consists of 9 Hz activity of moderate voltage (25-35 uV) seen predominantly in posterior head regions, symmetric and reactive to eye opening and eye closing. Sleep was characterized by vertex waves, sleep spindles (12 to 14 Hz), maximal frontocentral region. Event button was pressed on 11/13/2024 at 0846, 1006, 1013, 1507, 1524, 1537, 1843, 1855, 1900 and on 11/14/2024 0141. Patient had bilateral arm and legs stiffening and extension, followed by arm jerking. Concomitant EEG before, during and after the event showed normal posterior dominant rhythm and did not show any EEG changes suggest seizure. Hyperventilation and photic stimulation were not performed.   IMPRESSION: This study is within normal limits. No seizures or epileptiform discharges were seen throughout the recording. Multiple events were recorded as described above without concomitant EEG change.  These events were NON epileptic. A normal interictal EEG does not exclude the diagnosis of epilepsy. Priyanka KIDD Shelton     PERTINENT LAB RESULTS: CBC: Recent Labs    11/13/24 0555 11/14/24 0541  WBC 10.1 15.1*  HGB 14.6 15.5  HCT 42.3 43.4  PLT 216 238   CMET CMP     Component Value Date/Time   NA 136 11/14/2024 0541   K 4.1 11/14/2024 0541   CL 99 11/14/2024 0541   CO2 24 11/14/2024 0541   GLUCOSE 109 (H) 11/14/2024 0541   BUN 15 11/14/2024 0541   CREATININE 0.71 11/14/2024 0541   CALCIUM 9.6 11/14/2024 0541   PROT 5.8 (L) 11/12/2024 0528   ALBUMIN 4.0 11/13/2024 0555   AST 22 11/12/2024 0528   ALT 9 11/12/2024 0528   ALKPHOS 80 11/12/2024 0528   BILITOT 0.3 11/12/2024 0528   GFRNONAA >60 11/14/2024 0541    GFR Estimated Creatinine Clearance: 148.2 mL/min (by C-G formula based on SCr of 0.71 mg/dL).  No results for input(s): LIPASE, AMYLASE in the last 72 hours. Recent Labs    11/11/24 1652  CKTOTAL 125   Invalid input(s): POCBNP No results for input(s): DDIMER in the last 72 hours. No results for input(s): HGBA1C in the last 72 hours. No results for input(s): CHOL, HDL, LDLCALC, TRIG, CHOLHDL, LDLDIRECT in the last 72 hours. No results for input(s): TSH, T4TOTAL, T3FREE, THYROIDAB in the last 72 hours.  Invalid input(s): FREET3 No results for input(s): VITAMINB12, FOLATE, FERRITIN, TIBC, IRON, RETICCTPCT in the last 72 hours. Coags: No results for input(s): INR in the last 72 hours.  Invalid input(s): PT Microbiology: No results found for this or any previous visit (from the past 240 hours).  FURTHER DISCHARGE INSTRUCTIONS:  Get Medicines reviewed and adjusted: Please take all your medications with you for your next visit with your Primary MD  Laboratory/radiological data: Please request your Primary MD to go over all hospital tests and procedure/radiological results at the follow up, please ask your  Primary MD to get all Hospital records sent to his/her office.  In some cases, they will be blood work, cultures and biopsy results pending at the time of your discharge. Please request that your primary care M.D. goes through all the records of your hospital data and follows up on these results.  Also Note the following: If you experience worsening of your admission symptoms, develop shortness of breath, life threatening emergency, suicidal or homicidal thoughts you must seek medical attention immediately by calling 911 or calling your MD immediately  if symptoms less severe.  You must read complete instructions/literature along with all the possible adverse reactions/side effects for all the Medicines you take and that have been prescribed to you. Take any new Medicines after you have completely understood and accpet all the possible adverse reactions/side effects.   Do not drive when taking Pain medications or sleeping medications (Benzodaizepines)  Do not take more than prescribed Pain, Sleep and Anxiety Medications. It is not advisable to combine anxiety,sleep and pain medications without talking with your primary care practitioner  Special Instructions: If you have smoked or chewed Tobacco  in the last 2 yrs please stop smoking, stop any regular Alcohol  and or any Recreational drug use.  Wear Seat belts while driving.  Please note: You were cared for by a hospitalist during your hospital stay. Once you are discharged, your primary care physician will handle any further medical issues. Please note that NO REFILLS for any discharge medications will be authorized once you are discharged, as it is imperative that you return to your primary care physician (or establish a relationship with a primary care physician if you do not have one) for your post hospital discharge needs so that they can reassess your need for medications and monitor your lab values.  Total Time spent coordinating discharge  including counseling, education and face to face time equals greater than 30 minutes.  Signed: Dontreal Miera 11/14/2024 10:00 AM      [1]  Allergies Allergen Reactions   Dexamethasone  Shortness Of Breath and Other (See Comments)    Makes pt angry    Prednisone Other (See Comments)    Makes pt angry    "

## 2024-11-14 NOTE — Progress Notes (Signed)
 Patient had multiple episodes of seizure like activities throughout the day.Continuous EEG running.

## 2024-11-14 NOTE — Progress Notes (Signed)
 NEUROLOGY CONSULT FOLLOW UP NOTE   Date of service: November 14, 2024 Patient Name: Jaime Nixon MRN:  [id number] DOB:  [birth year]  Interval Hx/subjective   LTM read notes multiple non-epileptic events, no seizures.   Patient sitting up in bed, no neurological deficits, denies headache.   Vitals   Vitals:   11/14/24 0400 11/14/24 0451 11/14/24 0500 11/14/24 0600  BP: 130/83  (!) 140/98 (!) 132/92  Pulse: 89  84 91  Resp: 16  16 14   Temp:  98.4 F (36.9 C)    TempSrc:  Axillary    SpO2: 95%  97% 96%  Weight:      Height:         Body mass index is 27.12 kg/m.  Physical Exam   Constitutional: Appears well-developed and well-nourished.   Neurologic Examination   Neuro: Mental Status: Patient is awake, alert, oriented to person, place, month, year, and situation. Patient is able to give a clear and coherent history. No signs of aphasia or neglect Cranial Nerves: II to XII grossly intact.  Motor: Tone is normal. Bulk is normal. 5/5 strength was present in all four extremities.  Sensory: Sensation is symmetric to light touch in the arms and legs. Cerebellar: FNF intact bilaterally. No overt ataxia.    Medications Current Medications[1]  Labs and Diagnostic Imaging   CBC:  Recent Labs  Lab 11/11/24 1652 11/12/24 0528 11/13/24 0555 11/14/24 0541  WBC 15.9*   < > 10.1 15.1*  NEUTROABS 12.0*  --  6.3  --   HGB 15.9   < > 14.6 15.5  HCT 46.3   < > 42.3 43.4  MCV 87.5   < > 88.5 86.5  PLT 271   < > 216 238   < > = values in this interval not displayed.    Basic Metabolic Panel:  Lab Results  Component Value Date   NA 136 11/14/2024   K 4.1 11/14/2024   CO2 24 11/14/2024   GLUCOSE 109 (H) 11/14/2024   BUN 15 11/14/2024   CREATININE 0.71 11/14/2024   CALCIUM 9.6 11/14/2024   GFRNONAA >60 11/14/2024   GFRAA >60 05/29/2020   Lipid Panel:  Lab Results  Component Value Date   LDLCALC 149 (H) 05/15/2023   HgbA1c:  Lab Results  Component Value  Date   HGBA1C 5.5 05/15/2023   Urine Drug Screen:     Component Value Date/Time   LABOPIA NEGATIVE 11/11/2024 1716   COCAINSCRNUR NEGATIVE 11/11/2024 1716   LABBENZ NEGATIVE 11/11/2024 1716   AMPHETMU NEGATIVE 11/11/2024 1716   THCU POSITIVE (A) 11/11/2024 1716   LABBARB NEGATIVE 11/11/2024 1716    Alcohol Level     Component Value Date/Time   ETH <15 10/17/2024 0404   INR No results found for: INR APTT No results found for: APTT AED levels: No results found for: PHENYTOIN, ZONISAMIDE, LAMOTRIGINE, LEVETIRACETA  CT Head without contrast(Personally reviewed): Negative  MRI Brain(Personally reviewed): Negative   LTM EEG 11/13/2024 0841 to 11/14/2024 0953  This study is within normal limits. No seizures or epileptiform discharges were seen throughout the recording. Multiple events were recorded as described above without concomitant EEG change. These events were NON epileptic.  Assessment   Jaime Nixon is a 29 y.o. male with history of TBI, seizures (since 2017) with medication non-compliance (stopped approx 2 years ago), cannbinoid hyperemesis, chronic abdominal pain who presented to [facility] d/t multiple recurrent seizures x2 days and n/v x2 weeks. He was started on Depakote , but  continued to have seizures. MRI negative. He was transferred to [facility] for cEEG.   LTM read shows multiple non-epileptic events, no seizures seen. This was discussed thoroughly with patient at bedside. He states he is relieved, endorses multiple recent stressors and poor stress control, including recent sickness. Discussed CBT follow-up, he is in agreement.   Impression: PNES  Recommendations   - DC cEEG - continue Depacon  500mg  BID on discharge - follow up outpatient with psychotherapy for CBT.  Patient is OK for discharge from neurology standpoint, with recommendations as above. Thank you for this consultation.   Signed, Rocky JAYSON Likes, NP [facility]    NEUROHOSPITALIST  ADDENDUM Performed a face to face diagnostic evaluation.   I have reviewed the contents of history and physical exam as documented by PA/ARNP/Resident and agree with above documentation.  I have discussed and formulated the above plan as documented. Edits to the note have been made as needed.  Purl Claytor, MD [facility] [phone]   If 7pm to 7am, please call on call as listed on AMION.      [1]  Current Facility-Administered Medications:    acetaminophen  (TYLENOL ) tablet 650 mg, 650 mg, Oral, Q6H PRN **OR** acetaminophen  (TYLENOL ) suppository 650 mg, 650 mg, Rectal, Q6H PRN, Doutova, Anastassia, MD   divalproex  (DEPAKOTE ) DR tablet 500 mg, 500 mg, Oral, Q12H, Doutova, Anastassia, MD, 500 mg at 11/13/24 2144   feeding supplement (ENSURE PLUS HIGH PROTEIN) liquid 237 mL, 237 mL, Oral, BID BM, Singh, Prashant K, MD   HYDROcodone -acetaminophen  (NORCO/VICODIN) 5-325 MG per tablet 1-2 tablet, 1-2 tablet, Oral, Q4H PRN, Doutova, Anastassia, MD, 1 tablet at 11/12/24 1720   HYDROmorphone  (DILAUDID ) injection 0.5 mg, 0.5 mg, Intravenous, Q4H PRN, Ghimire, Shanker M, MD, 0.5 mg at 11/13/24 1917   LORazepam  (ATIVAN ) injection 2 mg, 2 mg, Intravenous, Q4H PRN, Sebastian Toribio GAILS, MD, 2 mg at 11/13/24 0805   nicotine  (NICODERM CQ  - dosed in mg/24 hours) patch 14 mg, 14 mg, Transdermal, Daily, Doutova, Anastassia, MD, 14 mg at 11/13/24 0942   pantoprazole  (PROTONIX ) EC tablet 40 mg, 40 mg, Oral, Daily, Doutova, Anastassia, MD, 40 mg at 11/13/24 1024   prochlorperazine  (COMPAZINE ) injection 10 mg, 10 mg, Intravenous, Q6H PRN, Ghimire, Shanker M, MD, 10 mg at 11/14/24 0724   promethazine  (PHENERGAN ) 12.5 mg in sodium chloride  0.9 % 50 mL IVPB, 12.5 mg, Intravenous, Q6H PRN, Ghimire, Shanker M, MD, Stopped at 11/13/24 1621   sucralfate  (CARAFATE ) tablet 1 g, 1 g, Oral, TID PRN, Doutova, Anastassia, MD   traZODone  (DESYREL ) tablet 50 mg, 50 mg, Oral, QHS PRN, Opyd, Timothy S, MD, 50 mg  at 11/13/24 2144

## 2024-11-14 NOTE — Procedures (Addendum)
 Patient Name: Jaime Nixon  MRN: [id number]  Epilepsy Attending: Arlin MALVA Krebs  Referring Physician/Provider: Khaliqdina, Salman, MD  Duration: 11/13/2024 0841 to 11/14/2024 0953  Patient history:  29 y.o. male with history of TBI, seizures (since 2017) with medication non-compliance (stopped approx 2 years ago), cannbinoid hyperemesis, chronic abdominal pain who presented to [facility] d/t multiple recurrent seizures x2 days and n/v x2 weeks. EEG to evaluate for seizure  Level of alertness: Awake, asleep  AEDs during EEG study: None  Technical aspects: This EEG study was done with scalp electrodes positioned according to the 10-20 International system of electrode placement. Electrical activity was reviewed with band pass filter of 1-70Hz , sensitivity of 7 uV/mm, display speed of 46mm/sec with a 60Hz  notched filter applied as appropriate. EEG data were recorded continuously and digitally stored.  Video monitoring was available and reviewed as appropriate.  Description: The posterior dominant rhythm consists of 9 Hz activity of moderate voltage (25-35 uV) seen predominantly in posterior head regions, symmetric and reactive to eye opening and eye closing. Sleep was characterized by vertex waves, sleep spindles (12 to 14 Hz), maximal frontocentral region.   Event button was pressed on 11/13/2024 at 0846, 1006, 1013, 1507, 1524, 1537, 1843, 1855, 1900 and on 11/14/2024 0141. Patient had bilateral arm and legs stiffening and extension, followed by arm jerking. Concomitant EEG before, during and after the event showed normal posterior dominant rhythm and did not show any EEG changes suggest seizure.  Hyperventilation and photic stimulation were not performed.     IMPRESSION: This study is within normal limits. No seizures or epileptiform discharges were seen throughout the recording.  Multiple events were recorded as described above without concomitant EEG change. These events were NON epileptic.  A  normal interictal EEG does not exclude the diagnosis of epilepsy.   Lyzette Reinhardt O Kynlei Piontek

## 2024-11-14 NOTE — Discharge Instructions (Signed)
 Jaime Nixon

## 2024-11-14 NOTE — TOC Transition Note (Signed)
 Transition of Care [facility]) - Discharge Note   Patient Details  Name: Jaime Nixon MRN: [id number] Date of Birth: 1996-03-17  Transition of Care [facility]) CM/SW Contact:  Marval Gell, RN Phone Number: 11/14/2024, 10:27 AM   Clinical Narrative:     Provided $0 MATCH for medications to [facility] pharmacy  Could not reach patient, added [facility] to AVS for patient to call to schedule appointment Monday.  Emailed artist to contact patient this week to assist with medicaid application.    Final next level of care: Home/Self Care Barriers to Discharge: No Barriers Identified   Patient Goals and CMS Choice            Discharge Placement                       Discharge Plan and Services Additional resources added to the After Visit Summary for                                       Social Drivers of Health (SDOH) Interventions SDOH Screenings   Food Insecurity: Food Insecurity Present (11/12/2024)  Housing: High Risk (11/12/2024)  Transportation Needs: No Transportation Needs (11/12/2024)  Utilities: At Risk (11/12/2024)  Alcohol Screen: Low Risk (05/16/2023)  Recent Concern: Alcohol Screen - Medium Risk (05/15/2023)  Depression (PHQ2-9): Low Risk (08/06/2022)  Tobacco Use: High Risk (11/11/2024)     Readmission Risk Interventions    10/19/2024   10:31 AM  Readmission Risk Prevention Plan  Post Dischage Appt Complete  Medication Screening Complete  Transportation Screening Complete

## 2024-11-14 NOTE — Progress Notes (Signed)
 LTM EEG disconnected - no skin breakdown at [facility]. [facility] notified.

## 2024-11-14 NOTE — Progress Notes (Signed)
 Discharge   Patient expressed verbal understanding of discharge POC.   Patient given time to ask any questions.  Additional education included in AVS.  Alert oriented in good spirits.   Tele on monitor  and PIV removed. CCMD called once tele removed. Pressure dressings intact.  All personal belongings at bedtime. TOC meds in process and Ride on the way.

## 2024-11-17 ENCOUNTER — Ambulatory Visit: Payer: Self-pay | Admitting: Nurse Practitioner

## 2024-11-17 LAB — THC,MS,WB/SP RFX
Cannabidiol: NEGATIVE
Cannabinoid Confirmation: POSITIVE
Carboxy-THC: 81.4 ng/mL
Hydroxy-THC: NEGATIVE ng/mL
Tetrahydrocannabinol(THC): 2.1 ng/mL

## 2024-11-17 LAB — DRUG SCREEN 10 W/CONF, SERUM
Amphetamines, IA: NEGATIVE ng/mL
Barbiturates, IA: NEGATIVE ug/mL
Benzodiazepines, IA: NEGATIVE ng/mL
Cocaine & Metabolite, IA: NEGATIVE ng/mL
Methadone, IA: NEGATIVE ng/mL
Opiates, IA: NEGATIVE ng/mL
Oxycodones, IA: NEGATIVE ng/mL
Phencyclidine, IA: NEGATIVE ng/mL
Propoxyphene, IA: NEGATIVE ng/mL
THC(Marijuana) Metabolite, IA: POSITIVE ng/mL — AB

## 2024-11-17 NOTE — Progress Notes (Incomplete)
 "  New Patient Visit  There were no vitals taken for this visit.   Subjective:    Patient ID: Jaime Nixon, male    DOB: 10/25/1996, 29 y.o.   MRN: [id number]  CC: No chief complaint on file.   HPI: Jaime Nixon is a 29 y.o. male presents for new patient visit to establish care. Introduced to publishing rights manager role and practice setting. All questions answered. Discussed provider/patient relationship and expectations.  Mr. Mcconathy has been seen in an emergent setting multiple times throughout his medical history since 2014. Most recently, he was seen January 15th 2026 for multiple seizure events over the past 2 days. He previously was on Keppra , but for the past two years has been unmedicated due to financial inaccessibility. While in the emergency room he did experience seizure-like activity witnessed by a nurse. He was admitted with neuroimaging resulting negative, then was placed on LTM-no EEG correlation with seizure-like activity (had multiple seizure-like activity while on LTM EEG). He was started on Depakote  500 mg BID and Sertaline 50 mg daily.  Past Medical History:  Diagnosis Date   Current smoker    History of substance use    Seizures (HCC)     No past surgical history on file.  Family History  Problem Relation Age of Onset   Seizures Father    Seizures Brother      Social History[1]  Medications Ordered Prior to Encounter[2]   Review of Systems      Objective:    There were no vitals taken for this visit.  Wt Readings from Last 3 Encounters:  11/11/24 200 lb (90.7 kg)  10/16/24 198 lb 6.6 oz (90 kg)  10/15/24 198 lb 6.6 oz (90 kg)    BP Readings from Last 3 Encounters:  11/14/24 (!) 132/92  11/10/24 (!) 144/92  11/08/24 (!) 154/96    Physical Exam     Assessment & Plan:   Problem List Items Addressed This Visit   None    Follow up plan: No follow-ups on file.   Tinnie DELENA Harada, NP  I,Emily Lagle,acting as a scribe for [employer], NP.,have documented all relevant documentation on the behalf of Lauren DELENA Harada, NP.  I, Tinnie DELENA Harada, NP, have reviewed all documentation for this visit. The documentation on 11/17/2024 for the exam, diagnosis, procedures, and orders are all accurate and complete.    [1]  Social History Tobacco Use   Smoking status: Every Day    Current packs/day: 1.00    Average packs/day: 1 pack/day for 10.0 years (10.0 ttl pk-yrs)    Types: Cigarettes   Smokeless tobacco: Never  Vaping Use   Vaping status: Never Used  Substance Use Topics   Alcohol use: Not Currently   Drug use: Not Currently    Types: Marijuana  [2]  Current Outpatient Medications on File Prior to Visit  Medication Sig Dispense Refill   divalproex  (DEPAKOTE ) 500 MG DR tablet Take 1 tablet (500 mg total) by mouth every 12 (twelve) hours. 60 tablet 1   pantoprazole  (PROTONIX ) 40 MG tablet Take 1 tablet (40 mg total) by mouth daily. 30 tablet 1   prochlorperazine  (COMPAZINE ) 10 MG tablet Take 1 tablet (10 mg total) by mouth every 6 (six) hours as needed for refractory nausea / vomiting. 30 tablet 0   sertraline  (ZOLOFT ) 50 MG tablet Take 1 tablet (50 mg total) by mouth daily. 30 tablet 1   [DISCONTINUED] levETIRAcetam  (KEPPRA ) 500 MG tablet  Take 1 tablet (500 mg total) by mouth 2 (two) times daily. 60 tablet 0   No current facility-administered medications on file prior to visit.   "

## 2025-02-04 ENCOUNTER — Ambulatory Visit: Payer: Self-pay | Admitting: Neurology
